# Patient Record
Sex: Male | Born: 1950
Health system: Southern US, Community
[De-identification: ages and names within clinical notes are randomized; demographics above are authoritative.]

## PROBLEM LIST (undated history)

## (undated) DIAGNOSIS — Z720 Tobacco use: Secondary | ICD-10-CM

## (undated) DIAGNOSIS — I10 Essential (primary) hypertension: Secondary | ICD-10-CM

## (undated) DIAGNOSIS — N189 Chronic kidney disease, unspecified: Secondary | ICD-10-CM

## (undated) DIAGNOSIS — IMO0002 Reserved for concepts with insufficient information to code with codable children: Secondary | ICD-10-CM

## (undated) DIAGNOSIS — D751 Secondary polycythemia: Secondary | ICD-10-CM

## (undated) DIAGNOSIS — E785 Hyperlipidemia, unspecified: Secondary | ICD-10-CM

## (undated) HISTORY — DX: Tobacco use: Z72.0

## (undated) HISTORY — PX: CYSTECTOMY: SUR359

## (undated) HISTORY — DX: Reserved for concepts with insufficient information to code with codable children: IMO0002

## (undated) HISTORY — DX: Chronic kidney disease, unspecified: N18.9

## (undated) HISTORY — DX: Hyperlipidemia, unspecified: E78.5

## (undated) HISTORY — DX: Secondary polycythemia: D75.1

---

## 2002-04-15 ENCOUNTER — Inpatient Hospital Stay (HOSPITAL_COMMUNITY): Admission: EM | Admit: 2002-04-15 | Discharge: 2002-04-26 | Payer: Self-pay

## 2002-04-16 ENCOUNTER — Encounter: Payer: Self-pay | Admitting: Internal Medicine

## 2002-04-17 ENCOUNTER — Encounter: Payer: Self-pay | Admitting: Internal Medicine

## 2002-04-20 ENCOUNTER — Encounter: Payer: Self-pay | Admitting: Internal Medicine

## 2002-05-01 ENCOUNTER — Encounter: Admission: RE | Admit: 2002-05-01 | Discharge: 2002-05-01 | Payer: Self-pay | Admitting: Internal Medicine

## 2002-05-08 ENCOUNTER — Encounter: Admission: RE | Admit: 2002-05-08 | Discharge: 2002-05-08 | Payer: Self-pay | Admitting: Internal Medicine

## 2002-05-15 ENCOUNTER — Encounter: Admission: RE | Admit: 2002-05-15 | Discharge: 2002-05-15 | Payer: Self-pay | Admitting: Internal Medicine

## 2002-05-18 ENCOUNTER — Encounter: Admission: RE | Admit: 2002-05-18 | Discharge: 2002-05-18 | Payer: Self-pay | Admitting: Internal Medicine

## 2002-05-21 ENCOUNTER — Encounter: Admission: RE | Admit: 2002-05-21 | Discharge: 2002-05-21 | Payer: Self-pay | Admitting: Internal Medicine

## 2002-05-22 ENCOUNTER — Encounter: Admission: RE | Admit: 2002-05-22 | Discharge: 2002-05-22 | Payer: Self-pay | Admitting: Internal Medicine

## 2002-05-28 ENCOUNTER — Encounter: Admission: RE | Admit: 2002-05-28 | Discharge: 2002-05-28 | Payer: Self-pay | Admitting: Internal Medicine

## 2002-06-04 ENCOUNTER — Encounter: Admission: RE | Admit: 2002-06-04 | Discharge: 2002-06-04 | Payer: Self-pay | Admitting: Internal Medicine

## 2002-06-11 ENCOUNTER — Encounter: Admission: RE | Admit: 2002-06-11 | Discharge: 2002-06-11 | Payer: Self-pay | Admitting: Internal Medicine

## 2002-06-18 ENCOUNTER — Encounter: Admission: RE | Admit: 2002-06-18 | Discharge: 2002-06-18 | Payer: Self-pay | Admitting: Internal Medicine

## 2002-06-25 ENCOUNTER — Encounter: Admission: RE | Admit: 2002-06-25 | Discharge: 2002-06-25 | Payer: Self-pay | Admitting: Internal Medicine

## 2002-07-28 ENCOUNTER — Encounter: Admission: RE | Admit: 2002-07-28 | Discharge: 2002-07-28 | Payer: Self-pay | Admitting: Internal Medicine

## 2002-08-04 ENCOUNTER — Encounter: Admission: RE | Admit: 2002-08-04 | Discharge: 2002-08-04 | Payer: Self-pay | Admitting: Internal Medicine

## 2002-10-05 ENCOUNTER — Encounter: Admission: RE | Admit: 2002-10-05 | Discharge: 2002-10-05 | Payer: Self-pay | Admitting: Internal Medicine

## 2002-12-08 ENCOUNTER — Encounter: Admission: RE | Admit: 2002-12-08 | Discharge: 2002-12-08 | Payer: Self-pay | Admitting: Surgery

## 2003-10-07 ENCOUNTER — Ambulatory Visit: Payer: Self-pay | Admitting: Internal Medicine

## 2003-10-18 ENCOUNTER — Ambulatory Visit: Payer: Self-pay | Admitting: Internal Medicine

## 2003-11-08 ENCOUNTER — Ambulatory Visit (HOSPITAL_COMMUNITY): Admission: RE | Admit: 2003-11-08 | Discharge: 2003-11-08 | Payer: Self-pay | Admitting: Internal Medicine

## 2005-12-25 ENCOUNTER — Inpatient Hospital Stay (HOSPITAL_COMMUNITY): Admission: EM | Admit: 2005-12-25 | Discharge: 2005-12-28 | Payer: Self-pay | Admitting: Emergency Medicine

## 2010-02-04 ENCOUNTER — Encounter: Payer: Self-pay | Admitting: Surgery

## 2010-12-15 ENCOUNTER — Inpatient Hospital Stay (HOSPITAL_COMMUNITY)
Admission: EM | Admit: 2010-12-15 | Discharge: 2010-12-28 | DRG: 585 | Disposition: A | Discharge status: Home / Self Care | Payer: BC Managed Care – PPO | Source: Ambulatory Visit | Attending: Surgery | Admitting: Surgery

## 2010-12-15 ENCOUNTER — Emergency Department (HOSPITAL_COMMUNITY): Payer: BC Managed Care – PPO

## 2010-12-15 ENCOUNTER — Encounter (HOSPITAL_COMMUNITY): Admission: EM | Disposition: A | Discharge status: Home / Self Care | Payer: Self-pay | Source: Ambulatory Visit

## 2010-12-15 ENCOUNTER — Other Ambulatory Visit: Payer: Self-pay

## 2010-12-15 ENCOUNTER — Encounter: Payer: Self-pay | Admitting: *Deleted

## 2010-12-15 DIAGNOSIS — R000 Tachycardia, unspecified: Secondary | ICD-10-CM | POA: Diagnosis present

## 2010-12-15 DIAGNOSIS — N289 Disorder of kidney and ureter, unspecified: Secondary | ICD-10-CM | POA: Diagnosis present

## 2010-12-15 DIAGNOSIS — Z8781 Personal history of (healed) traumatic fracture: Secondary | ICD-10-CM

## 2010-12-15 DIAGNOSIS — E119 Type 2 diabetes mellitus without complications: Secondary | ICD-10-CM | POA: Diagnosis present

## 2010-12-15 DIAGNOSIS — K659 Peritonitis, unspecified: Principal | ICD-10-CM | POA: Diagnosis present

## 2010-12-15 DIAGNOSIS — F17200 Nicotine dependence, unspecified, uncomplicated: Secondary | ICD-10-CM | POA: Diagnosis present

## 2010-12-15 DIAGNOSIS — I71019 Dissection of thoracic aorta, unspecified: Secondary | ICD-10-CM | POA: Diagnosis present

## 2010-12-15 DIAGNOSIS — K265 Chronic or unspecified duodenal ulcer with perforation: Secondary | ICD-10-CM | POA: Diagnosis present

## 2010-12-15 DIAGNOSIS — R6521 Severe sepsis with septic shock: Secondary | ICD-10-CM | POA: Diagnosis not present

## 2010-12-15 DIAGNOSIS — A419 Sepsis, unspecified organism: Secondary | ICD-10-CM | POA: Diagnosis not present

## 2010-12-15 DIAGNOSIS — I10 Essential (primary) hypertension: Secondary | ICD-10-CM | POA: Diagnosis present

## 2010-12-15 DIAGNOSIS — J449 Chronic obstructive pulmonary disease, unspecified: Secondary | ICD-10-CM | POA: Diagnosis present

## 2010-12-15 DIAGNOSIS — I739 Peripheral vascular disease, unspecified: Secondary | ICD-10-CM | POA: Diagnosis present

## 2010-12-15 DIAGNOSIS — R259 Unspecified abnormal involuntary movements: Secondary | ICD-10-CM | POA: Diagnosis present

## 2010-12-15 DIAGNOSIS — F1011 Alcohol abuse, in remission: Secondary | ICD-10-CM

## 2010-12-15 DIAGNOSIS — S42213A Unspecified displaced fracture of surgical neck of unspecified humerus, initial encounter for closed fracture: Secondary | ICD-10-CM | POA: Diagnosis present

## 2010-12-15 DIAGNOSIS — J95821 Acute postprocedural respiratory failure: Secondary | ICD-10-CM

## 2010-12-15 DIAGNOSIS — E669 Obesity, unspecified: Secondary | ICD-10-CM | POA: Diagnosis present

## 2010-12-15 DIAGNOSIS — I71012 Dissection of descending thoracic aorta: Secondary | ICD-10-CM

## 2010-12-15 DIAGNOSIS — I252 Old myocardial infarction: Secondary | ICD-10-CM

## 2010-12-15 DIAGNOSIS — J4489 Other specified chronic obstructive pulmonary disease: Secondary | ICD-10-CM | POA: Diagnosis present

## 2010-12-15 DIAGNOSIS — E876 Hypokalemia: Secondary | ICD-10-CM | POA: Diagnosis not present

## 2010-12-15 DIAGNOSIS — I7101 Dissection of thoracic aorta: Secondary | ICD-10-CM | POA: Diagnosis present

## 2010-12-15 HISTORY — DX: Essential (primary) hypertension: I10

## 2010-12-15 LAB — POCT I-STAT, CHEM 8
BUN: 60 mg/dL — ABNORMAL HIGH (ref 6–23)
Calcium, Ion: 1.11 mmol/L — ABNORMAL LOW (ref 1.12–1.32)
Chloride: 107 mEq/L (ref 96–112)
Creatinine, Ser: 1.9 mg/dL — ABNORMAL HIGH (ref 0.50–1.35)
Glucose, Bld: 132 mg/dL — ABNORMAL HIGH (ref 70–99)
HCT: 36 % — ABNORMAL LOW (ref 39.0–52.0)
Hemoglobin: 12.2 g/dL — ABNORMAL LOW (ref 13.0–17.0)
Potassium: 5.2 mEq/L — ABNORMAL HIGH (ref 3.5–5.1)
Sodium: 136 mEq/L (ref 135–145)
TCO2: 21 mmol/L (ref 0–100)

## 2010-12-15 LAB — DIFFERENTIAL
Basophils Absolute: 0 10*3/uL (ref 0.0–0.1)
Basophils Relative: 0 % (ref 0–1)
Eosinophils Absolute: 0 10*3/uL (ref 0.0–0.7)
Eosinophils Relative: 0 % (ref 0–5)
Lymphocytes Relative: 2 % — ABNORMAL LOW (ref 12–46)
Lymphs Abs: 0.6 10*3/uL — ABNORMAL LOW (ref 0.7–4.0)
Monocytes Absolute: 0.6 10*3/uL (ref 0.1–1.0)
Monocytes Relative: 3 % (ref 3–12)
Neutro Abs: 22.5 10*3/uL — ABNORMAL HIGH (ref 1.7–7.7)
Neutrophils Relative %: 95 % — ABNORMAL HIGH (ref 43–77)

## 2010-12-15 LAB — COMPREHENSIVE METABOLIC PANEL
ALT: 16 U/L (ref 0–53)
AST: 13 U/L (ref 0–37)
Albumin: 3.5 g/dL (ref 3.5–5.2)
Alkaline Phosphatase: 109 U/L (ref 39–117)
BUN: 57 mg/dL — ABNORMAL HIGH (ref 6–23)
CO2: 23 mEq/L (ref 19–32)
Calcium: 9.5 mg/dL (ref 8.4–10.5)
Chloride: 99 mEq/L (ref 96–112)
Creatinine, Ser: 1.81 mg/dL — ABNORMAL HIGH (ref 0.50–1.35)
GFR calc Af Amer: 45 mL/min — ABNORMAL LOW (ref >90)
GFR calc non Af Amer: 39 mL/min — ABNORMAL LOW (ref >90)
Glucose, Bld: 133 mg/dL — ABNORMAL HIGH (ref 70–99)
Potassium: 5.1 mEq/L (ref 3.5–5.1)
Sodium: 134 mEq/L — ABNORMAL LOW (ref 135–145)
Total Bilirubin: 0.5 mg/dL (ref 0.3–1.2)
Total Protein: 6.9 g/dL (ref 6.0–8.3)

## 2010-12-15 LAB — CBC
HCT: 34.1 % — ABNORMAL LOW (ref 39.0–52.0)
Hemoglobin: 11.3 g/dL — ABNORMAL LOW (ref 13.0–17.0)
MCH: 31 pg (ref 26.0–34.0)
MCHC: 33.1 g/dL (ref 30.0–36.0)
MCV: 93.4 fL (ref 78.0–100.0)
Platelets: 286 10*3/uL (ref 150–400)
RBC: 3.65 MIL/uL — ABNORMAL LOW (ref 4.22–5.81)
RDW: 13.3 % (ref 11.5–15.5)
WBC: 23.7 10*3/uL — ABNORMAL HIGH (ref 4.0–10.5)

## 2010-12-15 LAB — POCT I-STAT TROPONIN I: Troponin i, poc: 0.01 ng/mL (ref 0.00–0.08)

## 2010-12-15 LAB — LIPASE, BLOOD: Lipase: 61 U/L — ABNORMAL HIGH (ref 11–59)

## 2010-12-15 SURGERY — LAPAROTOMY, EXPLORATORY
Anesthesia: General

## 2010-12-15 MED ORDER — HYDROMORPHONE HCL PF 1 MG/ML IJ SOLN
1.0000 mg | Freq: Once | INTRAMUSCULAR | Status: AC
  Administered 2010-12-15: 1 mg via INTRAVENOUS

## 2010-12-15 MED ORDER — HYDROMORPHONE HCL PF 1 MG/ML IJ SOLN
1.0000 mg | Freq: Once | INTRAMUSCULAR | Status: AC
  Administered 2010-12-15: 1 mg via INTRAVENOUS
  Filled 2010-12-15: qty 1

## 2010-12-15 MED ORDER — SODIUM CHLORIDE 0.9 % IV SOLN
1.5000 g | Freq: Once | INTRAVENOUS | Status: AC
  Administered 2010-12-15: 1.5 g via INTRAVENOUS
  Filled 2010-12-15: qty 1.5

## 2010-12-15 MED ORDER — SODIUM CHLORIDE 0.9 % IV SOLN
INTRAVENOUS | Status: AC
  Filled 2010-12-15: qty 1.5

## 2010-12-15 MED ORDER — IOHEXOL 300 MG/ML  SOLN
80.0000 mL | Freq: Once | INTRAMUSCULAR | Status: AC | PRN
  Administered 2010-12-15: 80 mL via INTRAVENOUS

## 2010-12-15 MED ORDER — SODIUM CHLORIDE 0.9 % IV SOLN
Freq: Once | INTRAVENOUS | Status: AC
  Administered 2010-12-15 (×2): via INTRAVENOUS

## 2010-12-15 MED ORDER — ONDANSETRON HCL 4 MG/2ML IJ SOLN
4.0000 mg | Freq: Once | INTRAMUSCULAR | Status: AC
  Administered 2010-12-15: 4 mg via INTRAVENOUS
  Filled 2010-12-15: qty 2

## 2010-12-15 MED ORDER — HYDROMORPHONE HCL PF 1 MG/ML IJ SOLN
INTRAMUSCULAR | Status: AC
  Administered 2010-12-15: 1 mg via INTRAVENOUS
  Filled 2010-12-15: qty 1

## 2010-12-15 SURGICAL SUPPLY — 58 items
APPLIER CLIP 11 MED OPEN (CLIP)
APPLIER CLIP 13 LRG OPEN (CLIP)
APR CLP LRG 13 20 CLIP (CLIP)
APR CLP MED 11 20 MLT OPN (CLIP)
BAG HAMPER (MISCELLANEOUS) ×1 IMPLANT
BARRIER SKIN 2 3/4 (OSTOMY) IMPLANT
BARRIER SKIN OD2.25 2 3/4 FLNG (OSTOMY) IMPLANT
BRR SKN FLT 2.75X2.25 2 PC (OSTOMY)
CLAMP POUCH DRAINAGE QUIET (OSTOMY) IMPLANT
CLIP APPLIE 11 MED OPEN (CLIP) IMPLANT
CLIP APPLIE 13 LRG OPEN (CLIP) IMPLANT
CLOTH BEACON ORANGE TIMEOUT ST (SAFETY) ×1 IMPLANT
COVER LIGHT HANDLE STERIS (MISCELLANEOUS) ×2 IMPLANT
DRAPE WARM FLUID 44X44 (DRAPE) ×1 IMPLANT
DURAPREP 26ML APPLICATOR (WOUND CARE) ×1 IMPLANT
ELECT BLADE 6 FLAT ULTRCLN (ELECTRODE) IMPLANT
ELECT REM PT RETURN 9FT ADLT (ELECTROSURGICAL)
ELECTRODE REM PT RTRN 9FT ADLT (ELECTROSURGICAL) ×1 IMPLANT
GLOVE BIO SURGEON STRL SZ7.5 (GLOVE) ×2 IMPLANT
GOWN STRL REIN XL XLG (GOWN DISPOSABLE) ×3 IMPLANT
HARMONIC SHEARS 14CM COAG (MISCELLANEOUS) IMPLANT
INST SET MAJOR GENERAL (KITS) ×1 IMPLANT
KIT REMOVER STAPLE SKIN (MISCELLANEOUS) IMPLANT
KIT ROOM TURNOVER APOR (KITS) ×1 IMPLANT
LIGASURE IMPACT 36 18CM CVD LR (INSTRUMENTS) IMPLANT
MANIFOLD NEPTUNE II (INSTRUMENTS) ×1 IMPLANT
NS IRRIG 1000ML POUR BTL (IV SOLUTION) ×1 IMPLANT
PACK ABDOMINAL MAJOR (CUSTOM PROCEDURE TRAY) ×1 IMPLANT
PAD ARMBOARD 7.5X6 YLW CONV (MISCELLANEOUS) ×1 IMPLANT
POUCH OSTOMY 2 3/4  H 3804 (WOUND CARE)
POUCH OSTOMY 2 3/4 H 3804 (WOUND CARE)
POUCH OSTOMY 2 PC DRNBL 2.75 (WOUND CARE) IMPLANT
RELOAD LINEAR CUT PROX 55 BLUE (ENDOMECHANICALS) IMPLANT
RELOAD PROXIMATE 75MM BLUE (ENDOMECHANICALS) IMPLANT
RELOAD STAPLE 55 3.8 BLU REG (ENDOMECHANICALS) IMPLANT
RELOAD STAPLE 75 3.8 BLU REG (ENDOMECHANICALS) IMPLANT
RETRACTOR WND ALEXIS 25 LRG (MISCELLANEOUS) IMPLANT
RETRACTOR WOUND ALXS 34CM XLRG (MISCELLANEOUS) IMPLANT
RTRCTR WOUND ALEXIS 25CM LRG (MISCELLANEOUS)
RTRCTR WOUND ALEXIS 34CM XLRG (MISCELLANEOUS)
SET BASIN LINEN APH (SET/KITS/TRAYS/PACK) ×1 IMPLANT
SHEARS HARMONIC 23CM COAG (MISCELLANEOUS) IMPLANT
SPONGE GAUZE 4X4 12PLY (GAUZE/BANDAGES/DRESSINGS) ×1 IMPLANT
SPONGE LAP 18X18 X RAY DECT (DISPOSABLE) ×1 IMPLANT
STAPLER GUN LINEAR PROX 60 (STAPLE) IMPLANT
STAPLER PROXIMATE 55 BLUE (STAPLE) IMPLANT
STAPLER PROXIMATE 75MM BLUE (STAPLE) IMPLANT
STAPLER VISISTAT (STAPLE) ×1 IMPLANT
SUCTION POOLE TIP (SUCTIONS) ×1 IMPLANT
SUT CHROMIC 0 SH (SUTURE) ×1 IMPLANT
SUT CHROMIC 2 0 SH (SUTURE) ×1 IMPLANT
SUT NOVA NAB GS-26 0 60 (SUTURE) ×2 IMPLANT
SUT SILK 2 0 (SUTURE)
SUT SILK 2 0 REEL (SUTURE) ×1 IMPLANT
SUT SILK 2-0 18XBRD TIE 12 (SUTURE) ×1 IMPLANT
SUT SILK 3 0 SH CR/8 (SUTURE) ×2 IMPLANT
TOWEL BLUE STERILE X RAY DET (MISCELLANEOUS) ×1 IMPLANT
TRAY FOLEY CATH 14FR (SET/KITS/TRAYS/PACK) ×1 IMPLANT

## 2010-12-15 NOTE — ED Notes (Signed)
Pt c/o left sided abd pain that started this am; pt denies any n/v/d

## 2010-12-15 NOTE — ED Notes (Signed)
Complaining of left side abd pain. States pain began this morning. Rates pain as 7/10. Constant with spasms. Last ate at 2000 Thursday.

## 2010-12-15 NOTE — ED Notes (Signed)
Pt to be transferred to [facility] ed, pt requesting additional pain medication, Dr. Roderic Palau notified, additional orders given

## 2010-12-15 NOTE — ED Notes (Signed)
[facility] here to transport pt to [facility]

## 2010-12-15 NOTE — ED Notes (Signed)
Pt brother Ronalee Belts notified of pt being moved to [place]

## 2010-12-15 NOTE — ED Notes (Signed)
Dr. Arnoldo Morale in to speak with pt,

## 2010-12-15 NOTE — ED Notes (Signed)
Pt states that the pain medication has not changed his pain, pt screaming out in pain, abd tender to touch, Dr. Roderic Palau notified,

## 2010-12-15 NOTE — ED Notes (Signed)
Telephone orders received from Dr. Arnoldo Morale,

## 2010-12-15 NOTE — ED Notes (Signed)
Ronalee Belts (pt's brother) had to leave contact number is [phone]

## 2010-12-15 NOTE — ED Provider Notes (Signed)
History     CSN: [id number] Arrival date & time: 12/15/2010  5:33 PM   First MD Initiated Contact with Patient 12/15/10 1736      Chief Complaint  Patient presents with  . Abdominal Pain    (Consider location/radiation/quality/duration/timing/severity/associated sxs/prior treatment) Patient is a 60 y.o. male presenting with abdominal pain. The history is provided by the patient (The patient complains of severe left upper quadrant abdominal pain. It began this morning when he woke he had no vomiting). No language interpreter was used.  Abdominal Pain The primary symptoms of the illness include abdominal pain and nausea. The primary symptoms of the illness do not include fatigue, diarrhea, hematemesis or dysuria. The current episode started 6 to 12 hours ago. The onset of the illness was gradual. The problem has been rapidly worsening.  The abdominal pain does not radiate. The severity of the abdominal pain is 6/10. The abdominal pain is relieved by nothing. The abdominal pain is exacerbated by deep breathing.  The patient states that she believes she is currently not pregnant. The patient has not had a change in bowel habit. Symptoms associated with the illness do not include heartburn, constipation, hematuria, frequency or back pain. Significant associated medical issues do not include sickle cell disease or cardiac disease.    Past Medical History  Diagnosis Date  . Diabetes mellitus   . Hypertension     Past Surgical History  Procedure Date  . Cystectomy under tongue    History reviewed. No pertinent family history.  History  Substance Use Topics  . Smoking status: Current Everyday Smoker -- 1.0 packs/day    Types: Cigarettes  . Smokeless tobacco: Not on file  . Alcohol Use: Yes     daily      Review of Systems  Constitutional: Negative for fatigue.  HENT: Negative for congestion, sinus pressure and ear discharge.   Eyes: Negative for discharge.  Respiratory:  Negative for cough.   Cardiovascular: Negative for chest pain.  Gastrointestinal: Positive for nausea and abdominal pain. Negative for heartburn, diarrhea, constipation and hematemesis.  Genitourinary: Negative for dysuria, frequency and hematuria.  Musculoskeletal: Negative for back pain.  Skin: Negative for rash.  Neurological: Negative for seizures and headaches.  Hematological: Negative.   Psychiatric/Behavioral: Negative for hallucinations.    Allergies  Review of patient's allergies indicates no known allergies.  Home Medications  No current outpatient prescriptions on file.  BP 129/83  Pulse 96  Temp(Src) 97.3 F (36.3 C) (Oral)  Resp 16  Ht 5\' 6"  (1.676 m)  Wt 210 lb (95.255 kg)  BMI 33.89 kg/m2  SpO2 97%  Physical Exam  Constitutional: He is oriented to person, place, and time. He appears well-developed.  HENT:  Head: Normocephalic and atraumatic.  Eyes: Conjunctivae and EOM are normal. No scleral icterus.  Neck: Neck supple. No thyromegaly present.  Cardiovascular: Normal rate and regular rhythm.  Exam reveals no gallop and no friction rub.   No murmur heard. Pulmonary/Chest: No stridor. He has no wheezes. He has no rales. He exhibits no tenderness.  Abdominal: He exhibits no distension. There is tenderness. There is no rebound.       Tender luq  Musculoskeletal: Normal range of motion. He exhibits no edema.  Lymphadenopathy:    He has no cervical adenopathy.  Neurological: He is oriented to person, place, and time. Coordination normal.  Skin: No rash noted. No erythema.  Psychiatric: He has a normal mood and affect. His behavior is normal.  ED Course  Procedures (including critical care time)  Labs Reviewed  CBC - Abnormal; Notable for the following:    WBC 23.7 (*)    RBC 3.65 (*)    Hemoglobin 11.3 (*)    HCT 34.1 (*)    All other components within normal limits  DIFFERENTIAL - Abnormal; Notable for the following:    Neutrophils Relative 95 (*)     Neutro Abs 22.5 (*)    Lymphocytes Relative 2 (*)    Lymphs Abs 0.6 (*)    All other components within normal limits  COMPREHENSIVE METABOLIC PANEL - Abnormal; Notable for the following:    Sodium 134 (*)    Glucose, Bld 133 (*)    BUN 57 (*)    Creatinine, Ser 1.81 (*)    GFR calc non Af Amer 39 (*)    GFR calc Af Amer 45 (*)    All other components within normal limits  LIPASE, BLOOD - Abnormal; Notable for the following:    Lipase 61 (*)    All other components within normal limits  POCT I-STAT, CHEM 8 - Abnormal; Notable for the following:    Potassium 5.2 (*)    BUN 60 (*)    Creatinine, Ser 1.90 (*)    Glucose, Bld 132 (*)    Calcium, Ion 1.11 (*)    Hemoglobin 12.2 (*)    HCT 36.0 (*)    All other components within normal limits  POCT I-STAT TROPONIN I  I-STAT TROPONIN I  I-STAT, CHEM 8   Ct Abdomen Pelvis W Contrast  12/15/2010  *RADIOLOGY REPORT*  Clinical Data: Abdominal pain.  CT ABDOMEN AND PELVIS WITH CONTRAST  Technique:  Multidetector CT imaging of the abdomen and pelvis was performed following the standard protocol during bolus administration of intravenous contrast.  Contrast: 47mL OMNIPAQUE IOHEXOL 300 MG/ML IV SOLN  Comparison: 12/08/2002  Findings: Chronic aortic dissection is again noted.  Aorta now aneurysmal infrarenally measuring up to 3.9 cm compared 2.5 cm previously.  Dissection continues into the left common iliac artery.  Previously, the smaller lumen was felt to represent the false lumen.  If this is the case, the true lumen is occluded in the left common iliac artery with the false lumen remaining open and supplying the left internal iliac artery and external iliac artery.  Left common iliac artery is aneurysmal at 2.3 cm.  Mild aneurysmal dilatation of the right common iliac artery measuring 2.4 cm.  There is free air within the abdomen, anterior to the liver and continuing inferiorly.  Exact cause of free air is not visualized, but may be gastric in  origin as the majority of the free fluid is near the stomach.  There is a suggestion of possible wall thickening along the lesser curvature and in the distal stomach. Remainder of the large and small bowel are unremarkable.  Appendix is visualized and is normal.  No free fluid or adenopathy.  Liver, spleen, pancreas, adrenals and kidneys are unremarkable.  Lung bases are clear.  Heart is borderline in size.  IMPRESSION: Moderate pneumoperitoneum.  Question gastric origin.  Exact cause is not visualized but findings are compatible with bowel rupture.  Chronic aortic dissection.  New distal aortic and common iliac artery aneurysms.  The distal aorta measures maximally 3.9 cm.  The true lumen is occluded in the left common iliac artery.  So that the  These results were discussed with Dr. Roderic Palau at the time of interpretation.  Original Report Authenticated  By: Raelyn Number, M.D.   Dg Chest Portable 1 View  12/15/2010  *RADIOLOGY REPORT*  Clinical Data: Chest pain.  PORTABLE CHEST - 1 VIEW  Comparison: Chest CT 12/08/2002.  Findings: The heart is mildly enlarged.  The mediastinal and hilar contours are within normal limits.  The lungs are clear.  No pleural effusion.  The bony thorax is intact. A right humeral neck fracture is noted.  IMPRESSION: No acute cardiopulmonary findings. Right humeral neck fracture.  Original Report Authenticated By: P. Kalman Jewels, M.D.     1. Abdominal pain      Date: 12/15/2010  Rate: 83  Rhythm: normal sinus rhythm  QRS Axis: normal  Intervals: normal  ST/T Wave abnormalities: normal  Conduction Disutrbances:none  Narrative Interpretation:   Old EKG Reviewed: none available    MDM  abd pain ruptured viscous   Dr Arnoldo Morale to see pt     Maudry Diego, MD 12/15/10 2041

## 2010-12-15 NOTE — ED Notes (Signed)
Left side abd pain that started this am, abd distented, pt uncomfortable on stretcher, pt right arm in sling due to right humerus fx

## 2010-12-15 NOTE — ED Notes (Signed)
Pt states that his pain is better, pt calmer, able to lay flat on stretcher,

## 2010-12-15 NOTE — ED Notes (Addendum)
Dr. Arnoldo Morale here to evaluate pt for surgery, pt and family updated,

## 2010-12-15 NOTE — ED Notes (Signed)
Pt requesting additional pian medication, Dr. Roderic Palau notified, additional orders given

## 2010-12-15 NOTE — ED Notes (Signed)
MD at bedside. 

## 2010-12-15 NOTE — H&P (Addendum)
Harry James is an 60 y.o. male.   Chief Complaint: Abdominal pain HPI: Patient is a 60 year old white male who has been developing abdominal pain over the past 24 hours. The pain became more severe he presented emergency room for further evaluation. CT scan the abdomen and pelvis reveals pneumoperitoneum with free fluid and possible thickened gastric wall distally. He has never had this pain before. He states he broke his right humerus 2 weeks ago and has been taking Aleve, hydrocodone, and other medication. Denies BC powder or aspirin use.  Past Medical History  Diagnosis Date  . Diabetes mellitus   . Hypertension     Past Surgical History  Procedure Date  . Cystectomy under tongue    History reviewed. No pertinent family history. Social History:  reports that he has been smoking Cigarettes.  He has been smoking about 1 pack per day. He does not have any smokeless tobacco history on file. He reports that he drinks alcohol. He reports that he does not use illicit drugs.  Allergies: No Known Allergies  Medications Prior to Admission  Medication Dose Route Frequency Provider Last Rate Last Dose  . 0.9 %  sodium chloride infusion   Intravenous Once Maudry Diego, MD 125 mL/hr at 12/15/10 1840    . ampicillin-sulbactam (UNASYN) 1.5 g in sodium chloride 0.9 % 50 mL IVPB  1.5 g Intravenous Once [employer]      . HYDROmorphone (DILAUDID) injection 1 mg  1 mg Intravenous Once Maudry Diego, MD   1 mg at 12/15/10 1836  . HYDROmorphone (DILAUDID) injection 1 mg  1 mg Intravenous Once Maudry Diego, MD   1 mg at 12/15/10 1859  . HYDROmorphone (DILAUDID) injection 1 mg  1 mg Intravenous Once Maudry Diego, MD   1 mg at 12/15/10 2028  . iohexol (OMNIPAQUE) 300 MG/ML injection 80 mL  80 mL Intravenous Once PRN Medication Radiologist   80 mL at 12/15/10 1935  . ondansetron (ZOFRAN) injection 4 mg  4 mg Intravenous Once Maudry Diego, MD   4 mg at 12/15/10 1836   No current outpatient  prescriptions on file as of 12/15/2010.    Results for orders placed during the hospital encounter of 12/15/10 (from the past 48 hour(s))  CBC     Status: Abnormal   Collection Time   12/15/10  5:53 PM      Component Value Range Comment   WBC 23.7 (*) 4.0 - 10.5 (K/uL)    RBC 3.65 (*) 4.22 - 5.81 (MIL/uL)    Hemoglobin 11.3 (*) 13.0 - 17.0 (g/dL)    HCT 34.1 (*) 39.0 - 52.0 (%)    MCV 93.4  78.0 - 100.0 (fL)    MCH 31.0  26.0 - 34.0 (pg)    MCHC 33.1  30.0 - 36.0 (g/dL)    RDW 13.3  11.5 - 15.5 (%)    Platelets 286  150 - 400 (K/uL)   DIFFERENTIAL     Status: Abnormal   Collection Time   12/15/10  5:53 PM      Component Value Range Comment   Neutrophils Relative 95 (*) 43 - 77 (%)    Neutro Abs 22.5 (*) 1.7 - 7.7 (K/uL)    Lymphocytes Relative 2 (*) 12 - 46 (%)    Lymphs Abs 0.6 (*) 0.7 - 4.0 (K/uL)    Monocytes Relative 3  3 - 12 (%)    Monocytes Absolute 0.6  0.1 - 1.0 (  K/uL)    Eosinophils Relative 0  0 - 5 (%)    Eosinophils Absolute 0.0  0.0 - 0.7 (K/uL)    Basophils Relative 0  0 - 1 (%)    Basophils Absolute 0.0  0.0 - 0.1 (K/uL)   COMPREHENSIVE METABOLIC PANEL     Status: Abnormal   Collection Time   12/15/10  5:53 PM      Component Value Range Comment   Sodium 134 (*) 135 - 145 (mEq/L)    Potassium 5.1  3.5 - 5.1 (mEq/L)    Chloride 99  96 - 112 (mEq/L)    CO2 23  19 - 32 (mEq/L)    Glucose, Bld 133 (*) 70 - 99 (mg/dL)    BUN 57 (*) 6 - 23 (mg/dL)    Creatinine, Ser 1.81 (*) 0.50 - 1.35 (mg/dL)    Calcium 9.5  8.4 - 10.5 (mg/dL)    Total Protein 6.9  6.0 - 8.3 (g/dL)    Albumin 3.5  3.5 - 5.2 (g/dL)    AST 13  0 - 37 (U/L)    ALT 16  0 - 53 (U/L)    Alkaline Phosphatase 109  39 - 117 (U/L)    Total Bilirubin 0.5  0.3 - 1.2 (mg/dL)    GFR calc non Af Amer 39 (*) >90 (mL/min)    GFR calc Af Amer 45 (*) >90 (mL/min)   LIPASE, BLOOD     Status: Abnormal   Collection Time   12/15/10  5:53 PM      Component Value Range Comment   Lipase 61 (*) 11 - 59 (U/L)     POCT I-STAT, CHEM 8     Status: Abnormal   Collection Time   12/15/10  6:48 PM      Component Value Range Comment   Sodium 136  135 - 145 (mEq/L)    Potassium 5.2 (*) 3.5 - 5.1 (mEq/L)    Chloride 107  96 - 112 (mEq/L)    BUN 60 (*) 6 - 23 (mg/dL)    Creatinine, Ser 1.90 (*) 0.50 - 1.35 (mg/dL)    Glucose, Bld 132 (*) 70 - 99 (mg/dL)    Calcium, Ion 1.11 (*) 1.12 - 1.32 (mmol/L)    TCO2 21  0 - 100 (mmol/L)    Hemoglobin 12.2 (*) 13.0 - 17.0 (g/dL)    HCT 36.0 (*) 39.0 - 52.0 (%)   POCT I-STAT TROPONIN I     Status: Normal   Collection Time   12/15/10  7:05 PM      Component Value Range Comment   Troponin i, poc 0.01  0.00 - 0.08 (ng/mL)    Comment 3             Ct Abdomen Pelvis W Contrast  12/15/2010  *RADIOLOGY REPORT*  Clinical Data: Abdominal pain.  CT ABDOMEN AND PELVIS WITH CONTRAST  Technique:  Multidetector CT imaging of the abdomen and pelvis was performed following the standard protocol during bolus administration of intravenous contrast.  Contrast: 77mL OMNIPAQUE IOHEXOL 300 MG/ML IV SOLN  Comparison: 12/08/2002  Findings: Chronic aortic dissection is again noted.  Aorta now aneurysmal infrarenally measuring up to 3.9 cm compared 2.5 cm previously.  Dissection continues into the left common iliac artery.  Previously, the smaller lumen was felt to represent the false lumen.  If this is the case, the true lumen is occluded in the left common iliac artery with the false lumen remaining open and supplying the  left internal iliac artery and external iliac artery.  Left common iliac artery is aneurysmal at 2.3 cm.  Mild aneurysmal dilatation of the right common iliac artery measuring 2.4 cm.  There is free air within the abdomen, anterior to the liver and continuing inferiorly.  Exact cause of free air is not visualized, but may be gastric in origin as the majority of the free fluid is near the stomach.  There is a suggestion of possible wall thickening along the lesser curvature and  in the distal stomach. Remainder of the large and small bowel are unremarkable.  Appendix is visualized and is normal.  No free fluid or adenopathy.  Liver, spleen, pancreas, adrenals and kidneys are unremarkable.  Lung bases are clear.  Heart is borderline in size.  IMPRESSION: Moderate pneumoperitoneum.  Question gastric origin.  Exact cause is not visualized but findings are compatible with bowel rupture.  Chronic aortic dissection.  New distal aortic and common iliac artery aneurysms.  The distal aorta measures maximally 3.9 cm.  The true lumen is occluded in the left common iliac artery.  So that the  These results were discussed with Dr. Roderic Palau at the time of interpretation.  Original Report Authenticated By: Raelyn Number, M.D.   Dg Chest Portable 1 View  12/15/2010  *RADIOLOGY REPORT*  Clinical Data: Chest pain.  PORTABLE CHEST - 1 VIEW  Comparison: Chest CT 12/08/2002.  Findings: The heart is mildly enlarged.  The mediastinal and hilar contours are within normal limits.  The lungs are clear.  No pleural effusion.  The bony thorax is intact. A right humeral neck fracture is noted.  IMPRESSION: No acute cardiopulmonary findings. Right humeral neck fracture.  Original Report Authenticated By: P. Kalman Jewels, M.D.    Review of Systems  Constitutional: Positive for malaise/fatigue. Negative for chills.  HENT: Negative.   Eyes: Negative.   Respiratory: Positive for cough.   Cardiovascular: Negative.   Gastrointestinal: Positive for nausea, vomiting and abdominal pain.  Genitourinary: Negative.   Musculoskeletal: Negative.   Skin: Negative.   Neurological: Positive for weakness.  Endo/Heme/Allergies: Negative.     Blood pressure 129/83, pulse 96, temperature 97.3 F (36.3 C), temperature source Oral, resp. rate 16, height 5\' 6"  (1.676 m), weight 95.255 kg (210 lb), SpO2 97.00%. Physical Exam  Constitutional: He is oriented to person, place, and time. He appears well-developed and  well-nourished. He appears distressed.  HENT:  Head: Normocephalic and atraumatic.  Neck: Normal range of motion. Neck supple.  Cardiovascular: Normal rate, regular rhythm and normal heart sounds.   Respiratory: Effort normal. He has rales.  GI: He exhibits distension. There is tenderness. There is rebound and guarding.  Neurological: He is alert and oriented to person, place, and time.  Skin: Skin is warm and dry.  Psychiatric: He has a normal mood and affect. His behavior is normal.     Assessment/Plan Impression: Pneumoperitoneum, probable perforated peptic ulcer Plan: The patient will be taken emergently to the operating room for exploratory laparotomy. The risks and benefits of the procedure including bleeding, infection, and the possibility of cardiopulmonary difficulties were fully explained to the patient, gave informed consent.  Meryem Haertel A 12/15/2010, 8:48 PM  CRNA here at [facility] evaluated him and does not feel she can intubate him here safely.  No anesthesiologist on call to assist.  Will discuss with [facility] for possible transfer.  Patient made aware.

## 2010-12-15 NOTE — ED Notes (Signed)
Report given to Santiago Glad, RN at [facility]

## 2010-12-15 NOTE — Anesthesia Preprocedure Evaluation (Addendum)
Anesthesia Evaluation  Patient identified by MRN, date of birth, ID band Patient awake    Reviewed: Allergy & Precautions, H&P , NPO status , Patient's Chart, lab work & pertinent test results  Airway Mallampati: IV TM Distance: >3 FB Neck ROM: Full   Comment: Small mouth Dental  (+) Teeth Intact, Poor Dentition and Chipped   Pulmonary shortness of breath and at rest, COPDCurrent Smoker (1-2 packs per day),  + rhonchi  + wheezing      Cardiovascular Exercise Tolerance: Poor hypertension (unable to obtain med list [facility] denies prescription refill since Dec. 2011), Pt. on medications + Past MI, + Peripheral Vascular Disease and + DOE Regular Tachycardia Chronic aortic dissection on Ct. Pt. And pts. Brother states he has only one functional kidney due to uncontrolled HTN   Neuro/Psych    GI/Hepatic Patient received Oral Contrast Agents (sip-unable to complete according to pt and  nurse),  Endo/Other  Diabetes mellitus-, Poorly Controlled, Oral Hypoglycemic AgentsMorbid obesity  Renal/GU Renal InsufficiencyRenal diseaseHTN one functional kidney according to pt.     Musculoskeletal   Abdominal (+) obese,  Abdomen: tender.    Peds  Hematology   Anesthesia Other Findings   Reproductive/Obstetrics                      Anesthesia Physical Anesthesia Plan  ASA: III and Emergent  Anesthesia Plan: General   Post-op Pain Management:    Induction: Rapid sequence, Cricoid pressure planned and Intravenous  Airway Management Planned: Oral ETT and Video Laryngoscope Planned  Additional Equipment:   Intra-op Plan:   Post-operative Plan: Post-operative intubation/ventilation  Informed Consent: I have reviewed the patients History and Physical, chart, labs and discussed the procedure including the risks, benefits and alternatives for the proposed anesthesia with the patient or authorized  representative who has indicated his/her understanding and acceptance.   Dental advisory given  Plan Discussed with: CRNA and Surgeon  Anesthesia Plan Comments: (Case discussed with Dr. Arnoldo Morale. Difficult airway and abnormal labs  Discussed. Case cancelled per order Dr. Peggye Form.yates CRNA)     Anesthesia Quick Evaluation

## 2010-12-16 ENCOUNTER — Inpatient Hospital Stay (HOSPITAL_COMMUNITY): Payer: BC Managed Care – PPO

## 2010-12-16 ENCOUNTER — Encounter (HOSPITAL_COMMUNITY): Payer: Self-pay | Admitting: Anesthesiology

## 2010-12-16 ENCOUNTER — Encounter (HOSPITAL_COMMUNITY): Admission: EM | Disposition: A | Discharge status: Home / Self Care | Payer: Self-pay | Source: Ambulatory Visit

## 2010-12-16 ENCOUNTER — Inpatient Hospital Stay (HOSPITAL_COMMUNITY): Payer: BC Managed Care – PPO | Admitting: Anesthesiology

## 2010-12-16 DIAGNOSIS — R6521 Severe sepsis with septic shock: Secondary | ICD-10-CM

## 2010-12-16 DIAGNOSIS — S42213A Unspecified displaced fracture of surgical neck of unspecified humerus, initial encounter for closed fracture: Secondary | ICD-10-CM | POA: Diagnosis present

## 2010-12-16 DIAGNOSIS — I10 Essential (primary) hypertension: Secondary | ICD-10-CM | POA: Diagnosis present

## 2010-12-16 DIAGNOSIS — N289 Disorder of kidney and ureter, unspecified: Secondary | ICD-10-CM | POA: Diagnosis present

## 2010-12-16 DIAGNOSIS — K261 Acute duodenal ulcer with perforation: Secondary | ICD-10-CM

## 2010-12-16 DIAGNOSIS — A419 Sepsis, unspecified organism: Secondary | ICD-10-CM

## 2010-12-16 DIAGNOSIS — E669 Obesity, unspecified: Secondary | ICD-10-CM | POA: Diagnosis present

## 2010-12-16 DIAGNOSIS — K265 Chronic or unspecified duodenal ulcer with perforation: Secondary | ICD-10-CM | POA: Diagnosis present

## 2010-12-16 DIAGNOSIS — R0902 Hypoxemia: Secondary | ICD-10-CM

## 2010-12-16 DIAGNOSIS — J9600 Acute respiratory failure, unspecified whether with hypoxia or hypercapnia: Secondary | ICD-10-CM

## 2010-12-16 DIAGNOSIS — K659 Peritonitis, unspecified: Secondary | ICD-10-CM

## 2010-12-16 HISTORY — PX: LAPAROTOMY: SHX154

## 2010-12-16 LAB — CORTISOL: Cortisol, Plasma: 13 ug/dL

## 2010-12-16 LAB — MAGNESIUM: Magnesium: 1.6 mg/dL (ref 1.5–2.5)

## 2010-12-16 LAB — POCT I-STAT 3, ART BLOOD GAS (G3+)
Bicarbonate: 18.4 mEq/L — ABNORMAL LOW (ref 20.0–24.0)
Bicarbonate: 19.4 mEq/L — ABNORMAL LOW (ref 20.0–24.0)
O2 Saturation: 91 %
Patient temperature: 99.4
Patient temperature: 99.4
TCO2: 20 mmol/L (ref 0–100)
TCO2: 21 mmol/L (ref 0–100)
pCO2 arterial: 48 mmHg — ABNORMAL HIGH (ref 35.0–45.0)
pH, Arterial: 7.196 — CL (ref 7.350–7.450)
pO2, Arterial: 193 mmHg — ABNORMAL HIGH (ref 80.0–100.0)

## 2010-12-16 LAB — CBC
HCT: 27.8 % — ABNORMAL LOW (ref 39.0–52.0)
Hemoglobin: 9.5 g/dL — ABNORMAL LOW (ref 13.0–17.0)
Hemoglobin: 9.1 g/dL — ABNORMAL LOW (ref 13.0–17.0)
MCH: 30.4 pg (ref 26.0–34.0)
MCH: 30.3 pg (ref 26.0–34.0)
MCHC: 32.5 g/dL (ref 30.0–36.0)
MCHC: 32.7 g/dL (ref 30.0–36.0)
MCV: 92.7 fL (ref 78.0–100.0)
Platelets: 243 K/uL (ref 150–400)
RBC: 3 MIL/uL — ABNORMAL LOW (ref 4.22–5.81)
RDW: 13.6 % (ref 11.5–15.5)
WBC: 28.1 K/uL — ABNORMAL HIGH (ref 4.0–10.5)

## 2010-12-16 LAB — PROCALCITONIN: Procalcitonin: 5.79 ng/mL

## 2010-12-16 LAB — COMPREHENSIVE METABOLIC PANEL
ALT: 13 U/L (ref 0–53)
AST: 14 U/L (ref 0–37)
CO2: 19 mEq/L (ref 19–32)
Chloride: 107 mEq/L (ref 96–112)
GFR calc non Af Amer: 39 mL/min — ABNORMAL LOW (ref >90)
Sodium: 137 mEq/L (ref 135–145)
Total Bilirubin: 0.4 mg/dL (ref 0.3–1.2)

## 2010-12-16 LAB — BASIC METABOLIC PANEL WITH GFR
BUN: 51 mg/dL — ABNORMAL HIGH (ref 6–23)
CO2: 20 meq/L (ref 19–32)
Calcium: 7.7 mg/dL — ABNORMAL LOW (ref 8.4–10.5)
Chloride: 110 meq/L (ref 96–112)
Creatinine, Ser: 1.7 mg/dL — ABNORMAL HIGH (ref 0.50–1.35)
GFR calc Af Amer: 48 mL/min — ABNORMAL LOW
GFR calc non Af Amer: 42 mL/min — ABNORMAL LOW
Glucose, Bld: 116 mg/dL — ABNORMAL HIGH (ref 70–99)
Potassium: 5.4 meq/L — ABNORMAL HIGH (ref 3.5–5.1)
Sodium: 139 meq/L (ref 135–145)

## 2010-12-16 LAB — HEMOGLOBIN AND HEMATOCRIT, BLOOD
HCT: 27.4 % — ABNORMAL LOW (ref 39.0–52.0)
Hemoglobin: 9 g/dL — ABNORMAL LOW (ref 13.0–17.0)

## 2010-12-16 LAB — GLUCOSE, CAPILLARY
Glucose-Capillary: 158 mg/dL — ABNORMAL HIGH (ref 70–99)
Glucose-Capillary: 146 mg/dL — ABNORMAL HIGH (ref 70–99)

## 2010-12-16 LAB — PHOSPHORUS: Phosphorus: 4.1 mg/dL (ref 2.3–4.6)

## 2010-12-16 LAB — PROTIME-INR: INR: 1.24 (ref 0.00–1.49)

## 2010-12-16 SURGERY — LAPAROTOMY, EXPLORATORY
Anesthesia: General | Site: Abdomen | Wound class: Clean Contaminated

## 2010-12-16 MED ORDER — FLUCONAZOLE IN SODIUM CHLORIDE 200-0.9 MG/100ML-% IV SOLN
200.0000 mg | Freq: Once | INTRAVENOUS | Status: AC
  Administered 2010-12-16: 200 mg via INTRAVENOUS
  Filled 2010-12-16: qty 100

## 2010-12-16 MED ORDER — PROPOFOL 10 MG/ML IV EMUL: 5.0000 ug/kg/min | INTRAVENOUS | Status: DC

## 2010-12-16 MED ORDER — BIOTENE DRY MOUTH MT LIQD
15.0000 mL | Freq: Four times a day (QID) | OROMUCOSAL | Status: DC
  Administered 2010-12-16 – 2010-12-27 (×42): 15 mL via OROMUCOSAL

## 2010-12-16 MED ORDER — FLUCONAZOLE 100MG IVPB
100.0000 mg | INTRAVENOUS | Status: DC
  Administered 2010-12-17 – 2010-12-22 (×6): 100 mg via INTRAVENOUS
  Filled 2010-12-16 (×9): qty 50

## 2010-12-16 MED ORDER — SODIUM CHLORIDE 0.9 % IV SOLN
INTRAVENOUS | Status: DC
  Administered 2010-12-16: 10:00:00 via INTRAVENOUS
  Administered 2010-12-16 (×2): 500 mL via INTRAVENOUS
  Administered 2010-12-16 (×2): via INTRAVENOUS

## 2010-12-16 MED ORDER — SODIUM CHLORIDE 0.9 % IV SOLN
50.0000 ug/h | INTRAVENOUS | Status: DC
  Administered 2010-12-16: 175 ug/h via INTRAVENOUS
  Administered 2010-12-16: 75 ug/h via INTRAVENOUS
  Administered 2010-12-16: 100 ug/h via INTRAVENOUS
  Administered 2010-12-18 (×2): 300 ug/h via INTRAVENOUS
  Administered 2010-12-18: 100 ug/h via INTRAVENOUS
  Filled 2010-12-16 (×7): qty 50

## 2010-12-16 MED ORDER — METOPROLOL TARTRATE 1 MG/ML IV SOLN
2.5000 mg | INTRAVENOUS | Status: DC | PRN
  Administered 2010-12-16 – 2010-12-17 (×5): 5 mg via INTRAVENOUS
  Administered 2010-12-18: 2.5 mg via INTRAVENOUS
  Filled 2010-12-16 (×6): qty 5

## 2010-12-16 MED ORDER — SUCCINYLCHOLINE CHLORIDE 20 MG/ML IJ SOLN
INTRAMUSCULAR | Status: DC | PRN
  Administered 2010-12-16: 100 mg via INTRAVENOUS

## 2010-12-16 MED ORDER — IPRATROPIUM BROMIDE 0.02 % IN SOLN
0.5000 mg | Freq: Four times a day (QID) | RESPIRATORY_TRACT | Status: DC
  Administered 2010-12-16 – 2010-12-27 (×41): 0.5 mg via RESPIRATORY_TRACT
  Filled 2010-12-16 (×42): qty 2.5

## 2010-12-16 MED ORDER — SODIUM CHLORIDE 0.9 % IV BOLUS (SEPSIS): 1000.0000 mL | Freq: Once | INTRAVENOUS | Status: DC

## 2010-12-16 MED ORDER — INSULIN ASPART 100 UNIT/ML ~~LOC~~ SOLN
0.0000 [IU] | SUBCUTANEOUS | Status: DC
  Administered 2010-12-16 – 2010-12-19 (×7): 2 [IU] via SUBCUTANEOUS
  Administered 2010-12-19: 3 [IU] via SUBCUTANEOUS
  Administered 2010-12-19 (×4): 2 [IU] via SUBCUTANEOUS
  Administered 2010-12-20: 3 [IU] via SUBCUTANEOUS
  Administered 2010-12-20: 2 [IU] via SUBCUTANEOUS
  Administered 2010-12-20 (×3): 3 [IU] via SUBCUTANEOUS
  Administered 2010-12-20: 2 [IU] via SUBCUTANEOUS
  Administered 2010-12-20 – 2010-12-22 (×8): 3 [IU] via SUBCUTANEOUS
  Administered 2010-12-22: 5 [IU] via SUBCUTANEOUS
  Administered 2010-12-22 – 2010-12-23 (×5): 3 [IU] via SUBCUTANEOUS
  Administered 2010-12-23: 5 [IU] via SUBCUTANEOUS
  Administered 2010-12-23: 3 [IU] via SUBCUTANEOUS
  Administered 2010-12-23: 5 [IU] via SUBCUTANEOUS
  Administered 2010-12-24 (×6): 3 [IU] via SUBCUTANEOUS
  Administered 2010-12-25 – 2010-12-27 (×12): 2 [IU] via SUBCUTANEOUS
  Administered 2010-12-27: 3 [IU] via SUBCUTANEOUS
  Administered 2010-12-28: 2 [IU] via SUBCUTANEOUS
  Filled 2010-12-16: qty 3

## 2010-12-16 MED ORDER — ONDANSETRON HCL 4 MG/2ML IJ SOLN: 4.0000 mg | Freq: Four times a day (QID) | INTRAMUSCULAR | Status: DC | PRN

## 2010-12-16 MED ORDER — ROCURONIUM BROMIDE 100 MG/10ML IV SOLN
INTRAVENOUS | Status: DC | PRN
  Administered 2010-12-16: 50 mg via INTRAVENOUS

## 2010-12-16 MED ORDER — PROPOFOL 10 MG/ML IV EMUL
INTRAVENOUS | Status: DC | PRN
  Administered 2010-12-16 (×2): 100 mg via INTRAVENOUS

## 2010-12-16 MED ORDER — HYDROMORPHONE HCL PF 1 MG/ML IJ SOLN: 0.2500 mg | INTRAMUSCULAR | Status: DC | PRN

## 2010-12-16 MED ORDER — SODIUM CHLORIDE 0.9 % IV SOLN
INTRAVENOUS | Status: DC | PRN
  Administered 2010-12-16 (×2): via INTRAVENOUS

## 2010-12-16 MED ORDER — HEPARIN SODIUM (PORCINE) 5000 UNIT/ML IJ SOLN
5000.0000 [IU] | Freq: Three times a day (TID) | INTRAMUSCULAR | Status: DC
  Administered 2010-12-16 – 2010-12-28 (×35): 5000 [IU] via SUBCUTANEOUS
  Filled 2010-12-16 (×40): qty 1

## 2010-12-16 MED ORDER — PIPERACILLIN-TAZOBACTAM 3.375 G IVPB
3.3750 g | Freq: Three times a day (TID) | INTRAVENOUS | Status: DC
  Administered 2010-12-16 – 2010-12-23 (×23): 3.375 g via INTRAVENOUS
  Filled 2010-12-16 (×27): qty 50

## 2010-12-16 MED ORDER — PHENYLEPHRINE HCL 10 MG/ML IJ SOLN
INTRAMUSCULAR | Status: DC | PRN
  Administered 2010-12-16: 40 ug via INTRAVENOUS
  Administered 2010-12-16: 80 ug via INTRAVENOUS

## 2010-12-16 MED ORDER — HETASTARCH-ELECTROLYTES 6 % IV SOLN
INTRAVENOUS | Status: DC | PRN
  Administered 2010-12-16: 02:00:00 via INTRAVENOUS

## 2010-12-16 MED ORDER — ROCURONIUM BROMIDE 50 MG/5ML IV SOLN
70.0000 mg | Freq: Once | INTRAVENOUS | Status: AC
  Administered 2010-12-16: 70 mg via INTRAVENOUS
  Filled 2010-12-16: qty 7

## 2010-12-16 MED ORDER — INSULIN ASPART 100 UNIT/ML ~~LOC~~ SOLN
0.0000 [IU] | Freq: Three times a day (TID) | SUBCUTANEOUS | Status: DC
  Filled 2010-12-16: qty 3

## 2010-12-16 MED ORDER — PROMETHAZINE HCL 25 MG/ML IJ SOLN: 6.2500 mg | INTRAMUSCULAR | Status: DC | PRN

## 2010-12-16 MED ORDER — SODIUM CHLORIDE 0.9 % IV SOLN
2.0000 mg/h | INTRAVENOUS | Status: DC
  Administered 2010-12-16: 3 mg/h via INTRAVENOUS
  Administered 2010-12-16: 2 mg/h via INTRAVENOUS
  Administered 2010-12-16 – 2010-12-17 (×3): 3 mg/h via INTRAVENOUS
  Administered 2010-12-18: 2 mg/h via INTRAVENOUS
  Filled 2010-12-16 (×7): qty 10

## 2010-12-16 MED ORDER — CHLORHEXIDINE GLUCONATE 0.12 % MT SOLN
15.0000 mL | Freq: Two times a day (BID) | OROMUCOSAL | Status: DC
  Administered 2010-12-16 – 2010-12-21 (×12): 15 mL via OROMUCOSAL
  Filled 2010-12-16 (×13): qty 15

## 2010-12-16 MED ORDER — FENTANYL CITRATE 0.05 MG/ML IJ SOLN
INTRAMUSCULAR | Status: DC | PRN
  Administered 2010-12-16 (×2): 100 ug via INTRAVENOUS

## 2010-12-16 MED ORDER — FENTANYL BOLUS VIA INFUSION
50.0000 ug | Freq: Four times a day (QID) | INTRAVENOUS | Status: DC | PRN
  Administered 2010-12-17: 100 ug via INTRAVENOUS
  Filled 2010-12-16: qty 100

## 2010-12-16 MED ORDER — LEVALBUTEROL HCL 0.63 MG/3ML IN NEBU
0.6300 mg | INHALATION_SOLUTION | Freq: Four times a day (QID) | RESPIRATORY_TRACT | Status: DC
  Administered 2010-12-16 – 2010-12-27 (×40): 0.63 mg via RESPIRATORY_TRACT
  Filled 2010-12-16 (×50): qty 3

## 2010-12-16 MED ORDER — MIDAZOLAM HCL 5 MG/5ML IJ SOLN
INTRAMUSCULAR | Status: DC | PRN
  Administered 2010-12-16: 2 mg via INTRAVENOUS

## 2010-12-16 MED ORDER — METOPROLOL TARTRATE 1 MG/ML IV SOLN: 2.5000 mg | INTRAVENOUS | Status: DC | PRN

## 2010-12-16 MED ORDER — PANTOPRAZOLE SODIUM 40 MG IV SOLR
40.0000 mg | Freq: Two times a day (BID) | INTRAVENOUS | Status: DC
  Administered 2010-12-16 – 2010-12-27 (×23): 40 mg via INTRAVENOUS
  Filled 2010-12-16 (×25): qty 40

## 2010-12-16 MED ORDER — SODIUM CHLORIDE 0.9 % IV SOLN
INTRAVENOUS | Status: DC
  Administered 2010-12-16: 12:00:00 via INTRAVENOUS
  Administered 2010-12-17: 10 mL via INTRAVENOUS

## 2010-12-16 SURGICAL SUPPLY — 47 items
BLADE SURG ROTATE 9660 (MISCELLANEOUS) ×1 IMPLANT
CHLORAPREP W/TINT 26ML (MISCELLANEOUS) ×2 IMPLANT
CLOTH BEACON ORANGE TIMEOUT ST (SAFETY) ×2 IMPLANT
COVER SURGICAL LIGHT HANDLE (MISCELLANEOUS) ×2 IMPLANT
DRAIN CHANNEL 19F RND (DRAIN) ×2 IMPLANT
DRAPE LAPAROSCOPIC ABDOMINAL (DRAPES) ×2 IMPLANT
DRAPE WARM FLUID 44X44 (DRAPE) ×2 IMPLANT
DRSG VAC ATS LRG SENSATRAC (GAUZE/BANDAGES/DRESSINGS) ×1 IMPLANT
ELECT BLADE 6.5 EXT (BLADE) ×1 IMPLANT
ELECT REM PT RETURN 9FT ADLT (ELECTROSURGICAL) ×2
ELECTRODE REM PT RTRN 9FT ADLT (ELECTROSURGICAL) ×1 IMPLANT
EVACUATOR SILICONE 100CC (DRAIN) ×2 IMPLANT
GLOVE BIO SURGEON STRL SZ 6.5 (GLOVE) ×2 IMPLANT
GLOVE BIO SURGEON STRL SZ7 (GLOVE) ×3 IMPLANT
GLOVE BIOGEL PI IND STRL 6.5 (GLOVE) IMPLANT
GLOVE BIOGEL PI IND STRL 7.5 (GLOVE) ×1 IMPLANT
GLOVE BIOGEL PI INDICATOR 6.5 (GLOVE) ×1
GLOVE BIOGEL PI INDICATOR 7.5 (GLOVE) ×1
GOWN STRL NON-REIN LRG LVL3 (GOWN DISPOSABLE) ×5 IMPLANT
KIT BASIN OR (CUSTOM PROCEDURE TRAY) ×2 IMPLANT
KIT ROOM TURNOVER OR (KITS) ×2 IMPLANT
LIGASURE IMPACT 36 18CM CVD LR (INSTRUMENTS) IMPLANT
MANIFOLD NEPTUNE II (INSTRUMENTS) ×1 IMPLANT
NS IRRIG 1000ML POUR BTL (IV SOLUTION) ×12 IMPLANT
PACK GENERAL/GYN (CUSTOM PROCEDURE TRAY) ×2 IMPLANT
PAD ARMBOARD 7.5X6 YLW CONV (MISCELLANEOUS) ×4 IMPLANT
SPECIMEN JAR X LARGE (MISCELLANEOUS) IMPLANT
SPONGE GAUZE 4X4 12PLY (GAUZE/BANDAGES/DRESSINGS) ×1 IMPLANT
SPONGE LAP 18X18 X RAY DECT (DISPOSABLE) ×2 IMPLANT
STAPLER VISISTAT 35W (STAPLE) ×3 IMPLANT
SUCTION POOLE TIP (SUCTIONS) ×1 IMPLANT
SUT ETHILON 2 0 FS 18 (SUTURE) ×2 IMPLANT
SUT PDS AB 1 TP1 96 (SUTURE) ×2 IMPLANT
SUT SILK 2 0 (SUTURE) ×2
SUT SILK 2 0 SH CR/8 (SUTURE) ×3 IMPLANT
SUT SILK 2-0 18XBRD TIE 12 (SUTURE) ×1 IMPLANT
SUT SILK 3 0 (SUTURE) ×2
SUT SILK 3 0 SH CR/8 (SUTURE) ×1 IMPLANT
SUT SILK 3-0 18XBRD TIE 12 (SUTURE) ×1 IMPLANT
SUT VIC AB 3-0 SH 27 (SUTURE)
SUT VIC AB 3-0 SH 27X BRD (SUTURE) IMPLANT
TAPE HYPAFIX 4 X10 (GAUZE/BANDAGES/DRESSINGS) ×1 IMPLANT
TOWEL OR 17X24 6PK STRL BLUE (TOWEL DISPOSABLE) ×2 IMPLANT
TOWEL OR 17X26 10 PK STRL BLUE (TOWEL DISPOSABLE) ×2 IMPLANT
TRAY FOLEY CATH 14FRSI W/METER (CATHETERS) ×1 IMPLANT
WATER STERILE IRR 1000ML POUR (IV SOLUTION) ×1 IMPLANT
YANKAUER SUCT BULB TIP NO VENT (SUCTIONS) ×1 IMPLANT

## 2010-12-16 NOTE — Consult Note (Signed)
Name: Harry James MRN: [id number] DOB: [birth year]    LOS: 1  PCCM ADMISSION NOTE  PT PROFILE: Admitted to ICU post laparotomy for perforated duodenal ulcer. Left intubated due to critical status and comorbidities. PMH of htn, DM and smoking  History of Present Illness: This is a 60 year old male who has recently had a fractured right humeral neck. He has been taking some pain medication as well as what sounds like some nonsteroidals. It is not really appear even been taking a lot of these. He woke up with abdominal pain yesterday and it has progressively worsened. He has no nausea or vomiting. He has been passing gas and having bowel movements. The last time he really ate was Wednesday night due to the pain. He has no fevers. He has no prior history. He presented to the emergency room at outside hospital and appeared to have a perforated viscus by CT scan. He was unable to go surgery there due to his comorbidities and availability of an anesthesiologist. He was then transferred to our facility for definitive therapy. He does not have any prior history of ulcer disease.   Lines / Drains: ETT 12/1 >>> L Eclectic TLC 12/1>>> L radial a-line 12/1>>> Foley 12/1>>>  Cultures: Blood 12/1>>>  Antibiotics: Unasyn (Per [facility]) 12/1 >>12/1 Zosyn 12/1>>>  Tests / Events: 12/1 to OR to repair ruptured viscus.  The patient is sedated, intubated and unable to provide history, which was obtained for available medical records.    Past Medical History  Diagnosis Date  . Diabetes mellitus   . Hypertension    Past Surgical History  Procedure Date  . Cystectomy under tongue   Prior to Admission medications   Not on File   Allergies No Known Allergies  Family History History reviewed. No pertinent family history.  Social History  reports that he has been smoking Cigarettes.  He has been smoking about 1 pack per day. He does not have any smokeless tobacco history on file. He reports that he  drinks alcohol. He reports that he does not use illicit drugs.  Review Of Systems  11 points review of systems is negative with an exception of listed in HPI.  Vital Signs: Temp:  [97.3 F (36.3 C)-99 F (37.2 C)] 97.8 F (36.6 C) (11/30 2251) Pulse Rate:  [82-160] 160  (12/01 0256) Resp:  [16-24] 18  (11/30 2251) BP: (104-183)/(47-83) 183/83 mmHg (12/01 0256) SpO2:  [95 %-99 %] 98 % (12/01 0256) FiO2 (%):  [100 %] 100 % (12/01 0256) Weight:  [95.255 kg (210 lb)] 210 lb (95.255 kg) (11/30 1728)    Physical Examination: General:  Sedated and intubated obese male. Neuro:  Sedated, intubated, moving all ext spontaneously.   HEENT:  [place]/AT, PERRL, EOM-spontaneous, -LAN and -thyromegally. Cardiovascular:  RRR, Nl S1/S2, -M/R/G. Lungs:  Coarse BS diffusely. Abdomen:  Soft, NT, ND and -BS. Skin:  Spider anbiomas on upper chest.  BMET    Component Value Date/Time   NA 137 12/16/2010 0700   K 5.0 12/16/2010 0700   CL 107 12/16/2010 0700   CO2 19 12/16/2010 0700   GLUCOSE 137* 12/16/2010 0700   BUN 53* 12/16/2010 0700   CREATININE 1.78* 12/16/2010 0700   CALCIUM 7.6* 12/16/2010 0700   GFRNONAA 39* 12/16/2010 0700   GFRAA 46* 12/16/2010 0700   CBC    Component Value Date/Time   WBC 33.0* 12/16/2010 0700   RBC 3.13* 12/16/2010 0700   HGB 9.5* 12/16/2010 0700   HCT 29.2* 12/16/2010  0700   PLT 260 12/16/2010 0700   MCV 93.3 12/16/2010 0700   MCH 30.4 12/16/2010 0700   MCHC 32.5 12/16/2010 0700   RDW 13.4 12/16/2010 0700   LYMPHSABS 0.6* 12/15/2010 1753   MONOABS 0.6 12/15/2010 1753   EOSABS 0.0 12/15/2010 1753   BASOSABS 0.0 12/15/2010 1753   Ventilator settings: Vent Mode:  [-] PRVC FiO2 (%):  [100 %] 100 % Set Rate:  [15 bmp] 15 bmp Vt Set:  [500 mL] 500 mL PEEP:  [5 cmH20] 5 cmH20 Plateau Pressure:  [18 cmH20] 18 cmH20  Labs and Imaging:  Reviewed.  Please refer to the Assessment and Plan section for relevant results.  Assessment and Plan: 1) Vent dep resp failure post laparotomy  - now with pulmonary edema as well. Plan: Full vent support.  ABG now to adjust vent accordingly.   2) Hypotension: developing septic shock, on pressors due to perfed GI tract. Plan: Place A-line to accurately check BP (cuff on leg).  Continue levophed.  Check cortisol level.  Check CVP.  CXR post line placement.  Decrease sedation as tolerated.  2) DMII - ICU HG protocol.  3) tachycardia - Once BP is more stable will need beta blocker.  Seems more stable with sedation.  4) Peritonitis: Change unasyn to zosyn and f/u on cultures.  Best practices / Disposition: -->ICU status under [facility], PCCM consulting -->full code -->SCDs for DVT Px -->Protonix for GI Px -->ventilator bundle -->NPO -->family updated at bedside - Patient was a very difficult airway and is reaching for ETT.  Self extubation would be disastrous for him.  Will keep hands restrained.  The patient is critically ill with multiple organ systems failure and requires high complexity decision making for assessment and support, frequent evaluation and titration of therapies, application of advanced monitoring technologies and extensive interpretation of multiple databases. Critical Care Time devoted to patient care services described in this note is 45 minutes.  Jennet Maduro, M.D.

## 2010-12-16 NOTE — Progress Notes (Signed)
Day of Surgery  Subjective: Agitated and requiring a good deal of sedation to keep intubated.  Central line place this AM Sedated with Versed, and fentanyl.  2 JP drains, serosanguneous    Objective: Vital signs in last 24 hours: Temp:  [97.3 F (36.3 C)-99.4 F (37.4 C)] 98.4 F (36.9 C) (12/01 1146) Pulse Rate:  [82-174] 109  (12/01 1300) Resp:  [16-26] 23  (12/01 1300) BP: (65-183)/(36-83) 94/50 mmHg (12/01 1030) SpO2:  [92 %-100 %] 100 % (12/01 1300) FiO2 (%):  [70 %-100 %] 70 % (12/01 1349) Weight:  [95.255 kg (210 lb)-99.3 kg (218 lb 14.7 oz)] 218 lb 14.7 oz (99.3 kg) (12/01 0357)    Intake/Output from previous day: 11/30 0701 - 12/01 0700 In: 2636.2 [I.V.:1961.2; IV Piggyback:650] Out: 1485 [Urine:1350; Blood:135] Intake/Output this shift: Total I/O In: 1689.8 [I.V.:1639.8; Other:50] Out: [id number]  General appearance: currently sedated on vent. GI: obese, distended, 2 drains and wound vac in place.  Lab Results:   Basename 12/16/10 1134 12/16/10 0700  WBC 28.1* 33.0*  HGB 9.1* 9.5*  HCT 27.8* 29.2*  PLT 243 260    BMET  Basename 12/16/10 1134 12/16/10 0700  NA 139 137  K 5.4* 5.0  CL 110 107  CO2 20 19  GLUCOSE 116* 137*  BUN 51* 53*  CREATININE 1.70* 1.78*  CALCIUM 7.7* 7.6*   PT/INR  Basename 12/16/10 1134  LABPROT 15.9*  INR 1.24     Studies/Results: Ct Abdomen Pelvis W Contrast  12/15/2010  *RADIOLOGY REPORT*  Clinical Data: Abdominal pain.  CT ABDOMEN AND PELVIS WITH CONTRAST  Technique:  Multidetector CT imaging of the abdomen and pelvis was performed following the standard protocol during bolus administration of intravenous contrast.  Contrast: 98mL OMNIPAQUE IOHEXOL 300 MG/ML IV SOLN  Comparison: 12/08/2002  Findings: Chronic aortic dissection is again noted.  Aorta now aneurysmal infrarenally measuring up to 3.9 cm compared 2.5 cm previously.  Dissection continues into the left common iliac artery.  Previously, the smaller lumen  was felt to represent the false lumen.  If this is the case, the true lumen is occluded in the left common iliac artery with the false lumen remaining open and supplying the left internal iliac artery and external iliac artery.  Left common iliac artery is aneurysmal at 2.3 cm.  Mild aneurysmal dilatation of the right common iliac artery measuring 2.4 cm.  There is free air within the abdomen, anterior to the liver and continuing inferiorly.  Exact cause of free air is not visualized, but may be gastric in origin as the majority of the free fluid is near the stomach.  There is a suggestion of possible wall thickening along the lesser curvature and in the distal stomach. Remainder of the large and small bowel are unremarkable.  Appendix is visualized and is normal.  No free fluid or adenopathy.  Liver, spleen, pancreas, adrenals and kidneys are unremarkable.  Lung bases are clear.  Heart is borderline in size.  IMPRESSION: Moderate pneumoperitoneum.  Question gastric origin.  Exact cause is not visualized but findings are compatible with bowel rupture.  Chronic aortic dissection.  New distal aortic and common iliac artery aneurysms.  The distal aorta measures maximally 3.9 cm.  The true lumen is occluded in the left common iliac artery.  So that the  These results were discussed with Dr. Roderic Palau at the time of interpretation.  Original Report Authenticated By: Raelyn Number, M.D.   Dg Chest Port 1 View  12/16/2010  *  RADIOLOGY REPORT*  Clinical Data: Endotracheal tube.  PORTABLE CHEST - 1 VIEW  Comparison: 12/16/2010  Findings: Endotracheal tube is approximately 5.7 cm above the carina.  There is persistent volume loss in the left hemithorax probably representing lobar collapse and possibly pleural fluid. Right lung remains clear. Left subclavian central line in the SVC region.  No evidence for pneumothorax.  Nasogastric tube extends into the abdomen.  IMPRESSION: Support apparatuses as described.  Volume loss in  the left hemothorax and marked change from 12/15/2010.  Findings probably represent lobar collapse and possibly pleural fluid.  Original Report Authenticated By: Markus Daft, M.D.   Dg Chest Port 1 View  12/16/2010  *RADIOLOGY REPORT*  Clinical Data: Line placement  PORTABLE CHEST - 1 VIEW  Comparison: Plain film 12/16/2010 at 7:31 hours  Findings: Endotracheal tube is unchanged.  There is interval uncoiling of the NG tube which is in the stomach.  A left central venous line has been placed with the distal SVC.  No evidence of left pneumothorax.  There is volume loss in left hemithorax with pleural fluid and air space disease left upper lobe.  IMPRESSION:  1.  Interval placement left central venous line without complication. 2.  Lower lobe atelectasis, effusion, and air space disease in the upper lobe. 3.  Straightening of the NG tube.  Original Report Authenticated By: Suzy Bouchard, M.D.   Dg Chest Port 1 View  12/16/2010  *RADIOLOGY REPORT*  Clinical Data: NG tube  PORTABLE CHEST - 1 VIEW  Comparison: 12/16/2010  Findings: Endotracheal tube 4.8 cm above the carina.  NG tube is coiled old and projects over the mid thoracic area presumably within the mid esophagus region.  Heart remains enlarged with vascular congestion and dense left lower lobe/lingula collapse / consolidation.  Left effusion evident.  No pneumothorax.  IMPRESSION: NG tube coiled over the mid thoracic area presumably within the mid- esophagus.  Recommend complete removal and reattempting placement.  Otherwise stable portable chest exam findings.  Findings called to Truddie Crumble, ICU nurse caring for the patient  Original Report Authenticated By: M. Daryll Brod, M.D.   Portable Chest X-ray 1 View  12/16/2010  *RADIOLOGY REPORT*  Clinical Data: Endotracheal tube placement, status post cardiac catheterization.  PORTABLE CHEST - 1 VIEW  Comparison: Chest radiograph performed 12/15/2010  Findings: The patient's endotracheal tube is seen ending 5-6  cm above the carina.  An enteric tube is seen ending at the mid esophagus.  This should be advanced at least 18 cm.  There is dense retrocardiac airspace opacification, most likely reflecting atelectasis.  Mild right basilar opacity is also seen. Vascular congestion is noted.  No definite pleural effusion or pneumothorax is identified.  The cardiomediastinal silhouette is mildly enlarged.  No acute osseous abnormalities are seen.  IMPRESSION:  1.  Endotracheal tube seen ending 5-6 cm above the carina; this could be advanced 1-2 cm, as deemed clinically appropriate. 2.  Enteric tube noted ending at the mid esophagus; this should be advanced at least 18 cm. 3.  Dense retrocardiac airspace opacification most likely reflects atelectasis; mild right basilar airspace opacity also seen. Vascular congestion noted. 4.  Mild cardiomegaly.  Findings were discussed with Sherlene Shams on [phone] at 05:49 a.m. on 12/16/2010.  Original Report Authenticated By: Santa Lighter, M.D.   Dg Chest Portable 1 View  12/15/2010  *RADIOLOGY REPORT*  Clinical Data: Chest pain.  PORTABLE CHEST - 1 VIEW  Comparison: Chest CT 12/08/2002.  Findings: The heart is mildly enlarged.  The mediastinal and hilar contours are within normal limits.  The lungs are clear.  No pleural effusion.  The bony thorax is intact. A right humeral neck fracture is noted.  IMPRESSION: No acute cardiopulmonary findings. Right humeral neck fracture.  Original Report Authenticated By: P. Kalman Jewels, M.D.   Dg Humerus Right  12/16/2010  *RADIOLOGY REPORT*  Clinical Data: Right humerus fracture  RIGHT HUMERUS - 2+ VIEW  Comparison: 12/15/2010  Findings: Proximal right humerus surgical neck fracture noted with minimal displacement and probable slight impaction.  No associated subluxation or dislocation at the glenohumeral joint.  AC joint aligned.  Distal humerus at the elbow intact.  IMPRESSION: Acute proximal humerus surgical neck fracture.  Original Report  Authenticated By: Jerilynn Mages. Daryll Brod, M.D.    Anti-infectives: Anti-infectives     Start     Dose/Rate Route Frequency Ordered Stop   12/17/10 0800   fluconazole (DIFLUCAN) IVPB 100 mg        100 mg 50 mL/hr over 60 Minutes Intravenous Every 24 hours 12/16/10 0334     12/16/10 0600  piperacillin-tazobactam (ZOSYN) IVPB 3.375 g       3.375 g 12.5 mL/hr over 240 Minutes Intravenous 3 times per day 12/16/10 0320     12/16/10 0500   fluconazole (DIFLUCAN) IVPB 200 mg        200 mg 100 mL/hr over 60 Minutes Intravenous  Once 12/16/10 0334 12/16/10 0613   12/15/10 2030   ampicillin-sulbactam (UNASYN) 1.5 g in sodium chloride 0.9 % 50 mL IVPB        1.5 g 100 mL/hr over 30 Minutes Intravenous  Once 12/15/10 2027 12/15/10 2127         Current Facility-Administered Medications  Medication Dose Route Frequency Provider Last Rate Last Dose  . 0.9 %  sodium chloride infusion   Intravenous Once Maudry Diego, MD 125 mL/hr at 12/15/10 2153    . 0.9 %  sodium chloride infusion   Intravenous Continuous [name], MD 125 mL/hr at 12/16/10 1000    . 0.9 %  sodium chloride infusion   Intravenous Continuous Wesam Yacoub 10 mL/hr at 12/16/10 1215    . ampicillin-sulbactam (UNASYN) 1.5 g in sodium chloride 0.9 % 50 mL IVPB  1.5 g Intravenous Once Mark A Jenkins 100 mL/hr at 12/15/10 2057 1.5 g at 12/15/10 2057  . antiseptic oral rinse (BIOTENE) solution 15 mL  15 mL Mouth Rinse QID [name], MD   15 mL at 12/16/10 1211  . chlorhexidine (PERIDEX) 0.12 % solution 15 mL  15 mL Mouth Rinse BID [name], MD   15 mL at 12/16/10 0744  . fentaNYL (SUBLIMAZE) 10 mcg/mL in sodium chloride 0.9 % 250 mL infusion  50-400 mcg/hr Intravenous Titrated Merton Border, MD 7.5 mL/hr at 12/16/10 1330 75 mcg/hr at 12/16/10 1330   And  . fentaNYL (SUBLIMAZE) bolus via infusion 50-100 mcg  50-100 mcg Intravenous Q6H PRN Merton Border, MD      . fluconazole (DIFLUCAN) IVPB 100 mg  100 mg Intravenous  Q24H Bronson Curb Abbott, PHARMD      . fluconazole (DIFLUCAN) IVPB 200 mg  200 mg Intravenous Once [employer] Abbott, PHARMD   200 mg at 12/16/10 [id number]  . heparin injection 5,000 Units  5,000 Units Subcutaneous Q8H [name], MD      . HYDROmorphone (DILAUDID) injection 1 mg  1 mg Intravenous Once Maudry Diego, MD   1 mg at 12/15/10 1836  . HYDROmorphone (DILAUDID)  injection 1 mg  1 mg Intravenous Once Maudry Diego, MD   1 mg at 12/15/10 1859  . HYDROmorphone (DILAUDID) injection 1 mg  1 mg Intravenous Once Maudry Diego, MD   1 mg at 12/15/10 2028  . HYDROmorphone (DILAUDID) injection 1 mg  1 mg Intravenous Once Maudry Diego, MD   1 mg at 12/15/10 2154  . insulin aspart (novoLOG) injection 0-15 Units  0-15 Units Subcutaneous Q4H Merton Border, MD   2 Units at 12/16/10 [phone]  . iohexol (OMNIPAQUE) 300 MG/ML injection 80 mL  80 mL Intravenous Once PRN Medication Radiologist   80 mL at 12/15/10 1935  . ipratropium (ATROVENT) nebulizer solution 0.5 mg  0.5 mg Nebulization Q6H Merton Border, MD   0.5 mg at 12/16/10 1347  . levalbuterol (XOPENEX) nebulizer solution 0.63 mg  0.63 mg Nebulization Q6H Merton Border, MD   0.63 mg at 12/16/10 1347  . metoprolol (LOPRESSOR) injection 2.5-5 mg  2.5-5 mg Intravenous Q3H PRN Merton Border, MD   5 mg at 12/16/10 0348  . midazolam (VERSED) 1 mg/mL in sodium chloride 0.9 % 50 mL infusion  2-5 mg/hr Intravenous Continuous Merton Border, MD 2 mL/hr at 12/16/10 1330 2 mg/hr at 12/16/10 1330  . ondansetron (ZOFRAN) injection 4 mg  4 mg Intravenous Once Maudry Diego, MD   4 mg at 12/15/10 1836  . ondansetron (ZOFRAN) injection 4 mg  4 mg Intravenous Q6H PRN [name], MD      . pantoprazole (PROTONIX) injection 40 mg  40 mg Intravenous Q12H [name], MD   40 mg at 12/16/10 1210  . piperacillin-tazobactam (ZOSYN) IVPB 3.375 g  3.375 g Intravenous Q8H Bronson Curb Abbott, PHARMD   3.375 g at 12/16/10 [id number]  . rocuronium  (ZEMURON) injection 70 mg  70 mg Intravenous Once Wesam Yacoub   70 mg at 12/16/10 1012  . sodium chloride 0.9 % bolus 1,000 mL  1,000 mL Intravenous Once [employer]      . DISCONTD: insulin aspart (novoLOG) injection 0-15 Units  0-15 Units Subcutaneous TID WC [name], MD      . DISCONTD: metoprolol (LOPRESSOR) injection 2.5-5 mg  2.5-5 mg Intravenous Q3H PRN Merton Border, MD      . DISCONTD: propofol (DIPRIVAN) 10 MG/ML infusion  5-50 mcg/kg/min Intravenous Titrated Merton Border, MD       Facility-Administered Medications Ordered in Other Encounters  Medication Dose Route Frequency Provider Last Rate Last Dose  . DISCONTD: 0.9 %  sodium chloride infusion    Continuous PRN Danna Hefty      . DISCONTD: fentaNYL (SUBLIMAZE) injection    PRN Danna Hefty   100 mcg at 12/16/10 0215  . DISCONTD: hetastarch in lactated electrolyte (HEXTEND) 6 % infusion    Continuous PRN Danna Hefty      . DISCONTD: midazolam (VERSED) 5 MG/5ML injection    PRN Danna Hefty   2 mg at 12/16/10 [id number]  . DISCONTD: phenylephrine (NEO-SYNEPHRINE) injection    PRN Danna Hefty   40 mcg at 12/16/10 0130  . DISCONTD: propofol (DIPRIVAN) 10 MG/ML infusion    PRN Danna Hefty   100 mg at 12/16/10 0135  . DISCONTD: rocuronium (ZEMURON) injection    PRN Danna Hefty   50 mg at 12/16/10 0125  . DISCONTD: succinylcholine (ANECTINE) injection    PRN Danna Hefty   100 mg at 12/16/10 [id number]    Assessment/Plan Perforated Duodenal Ulcer with  exploratory Lap, Graham Patch, late last night,/early AM. Right Humeral Fracture  Concern for withdrawal AODM by Hx. No meds reported on admission. Hx hypertension No meds on admission. Hypotension, tachycardia, elevated WBC, H/H stable Plan:Appreciate Critical care help.  Will continue current RX.  Follow closely.  LOS: 1 day    Harry James 12/16/2010

## 2010-12-16 NOTE — Op Note (Signed)
Preoperative diagnosis: Perforated viscus Postoperative diagnosis: Perforated duodenal ulcer  Procedure: Exploratory laparotomy with [name] patch of perforated duodenal ulcer Surgeon: Dr. Serita Grammes Asst.: Dr. Madilyn Hook  Estimated blood loss: 100 cc Drains: 19 French Blake drain near pylorus into 19 [place] drain in the left upper quadrant Complications: None Sponge and needle counts were correct x2 at end of operation Disposition: To ICU intubated Specimens: None  Indications: This is a 60 year old male who recently had a fractured right humeral neck. He has been taking some pain medication as well as nonsteroidals. He does not have a really a prior history of ulcer disease. He has now a little bit over 48 hours of abdominal pain that both by his exam and by CT scan appeared to be a perforated viscus. He was initially seen at an outside hospital and transferred here for anesthesia management. I evaluated him and recommended exploratory laparotomy. We discussed the risks and benefits of the procedure.  Procedure: After informed consent was obtained the patient was taken to the operating room. He already been administered 3 g of intravenous Unasyn. Sequential compression devices were placed on his lower extremities. He was then underwent general anesthesia without complication. His abdomen was prepped and draped in the standard sterile surgical fashion. A Foley catheter was inserted. A surgical timeout was performed.  An upper midline incision was then made and this was carried out through his peritoneum. Upon entering a large volume of purulent fluid present. The Bookwalter retractor was inserted. Upon insertion we noted that he had a perforated anterior duodenal ulcer. He had a  large amount of contamination in his bilateral upper quadrants of his abdomen. We used about 10 L of irrigation to wash this entire area out. We then approached the ulcer. The tissue was very friable. It was  about a 6 mm x 6 mm hole. It had clearly been there for over 24 hours. I made a tongue of omentum and then brought this up. I placed 4 2-0 silks sutures on either side of the ulcer. I then placed the omentum there is a Associate Professor. I then tied the stitches down. I tacked the end portion of the omentum also to the stomach as well. This adequately covered the entire area. We then irrigated more. I then placed a drain right next to the perforation in the repair. I also placed a drain in the left upper quadrant due to the significant amount of purulence and contamination that was present there. These drains were secured with 2-0 nylon. I then closed his abdomen with #1 PDS. I left the skin open and placed a vac sponge overlying. He tolerated this well. Due to his comorbidities as well as difficulty with intubation he would be sent to the ICU intubated.

## 2010-12-16 NOTE — ED Notes (Signed)
General surgeon at bedside.  

## 2010-12-16 NOTE — Procedures (Signed)
Central Venous Catheter Insertion Procedure Note Harry James [id number] 1950-07-30  Procedure: Insertion of Central Venous Catheter Indications: Drug and/or fluid administration  Procedure Details Consent: Risks of procedure as well as the alternatives and risks of each were explained to the (patient/caregiver).  Consent for procedure obtained. Time Out: Verified patient identification, verified procedure, site/side was marked, verified correct patient position, special equipment/implants available, medications/allergies/relevent history reviewed, required imaging and test results available.  Performed  Maximum sterile technique was used including antiseptics, cap, gloves, gown, hand hygiene, mask and sheet. Skin prep: Chlorhexidine; local anesthetic administered A antimicrobial bonded/coated triple lumen catheter was placed in the left subclavian vein using the Seldinger technique.  Evaluation Blood flow good Complications: No apparent complications Patient did tolerate procedure well. Chest X-ray ordered to verify placement.  CXR: pending.  Harry James 12/16/2010, 10:36 AM

## 2010-12-16 NOTE — Progress Notes (Signed)
Orthopedic Tech Progress Note Patient Details:  Harry James 1950-05-03 [id number]      Braulio Bosch 12/16/2010, 3:22 PM Applied sling and swathe

## 2010-12-16 NOTE — Addendum Note (Signed)
Addendum  created 12/16/10 0307 by Danna Hefty   Modules edited:Charges VN

## 2010-12-16 NOTE — Progress Notes (Addendum)
Patient becomes extremely agitated when spontaneously awakens from sedation. RN careful to bolus patient r/t hypotension. MD aware and 1L bolus ordered for hypotension. Per Dr. Nelda Marseille, and order was received to restrain patient because of his attempts to pull et tube out when awake. When patient has a rass of +3 he is unreasonable and inconsolable.  Dr. Nelda Marseille and multiple other staff has been at bedside assisting primary RN with holding patients arms down while RN can give patient additional sedation. For these reasons, and the fact that the patient will be a difficult reintubation per Dr. Nelda Marseille, he explained that a sitter would not be the best safty choice for this patient and a restraint order for bilateral soft wrist was carried out. Trish Fountain English RN

## 2010-12-16 NOTE — Transfer of Care (Signed)
Immediate Anesthesia Transfer of Care Note  Patient: Harry James  Procedure(s) Performed:  EXPLORATORY LAPAROTOMY  Patient Location: ICU  Anesthesia Type: General  Level of Consciousness: unresponsive  Airway & Oxygen Therapy: Patient remains intubated per anesthesia plan  Post-op Assessment: Post -op Vital signs reviewed and stable  Post vital signs: Reviewed and stable  Complications: No apparent anesthesia complications

## 2010-12-16 NOTE — Anesthesia Postprocedure Evaluation (Signed)
  Anesthesia Post-op Note  Patient: Harry James  Procedure(s) Performed:  EXPLORATORY LAPAROTOMY  Patient Location: ICU  Anesthesia Type: General  Level of Consciousness: unresponsive  Airway and Oxygen Therapy: Patient remains intubated per anesthesia plan  Post-op Pain: none  Post-op Assessment: Post-op Vital signs reviewed, Patient's Cardiovascular Status Stable and No signs of Nausea or vomiting  Post-op Vital Signs: Reviewed and stable  Complications: No apparent anesthesia complications

## 2010-12-16 NOTE — Progress Notes (Signed)
Dr. Alva Garnet notified of results of [place] and ET placement. No new order at this time.

## 2010-12-16 NOTE — Progress Notes (Signed)
Dr. Donne Hazel notified of NG tube 18cm from stomach, noted in mid esophagus. New order obtained.

## 2010-12-16 NOTE — Procedures (Signed)
Arterial Catheter Insertion Procedure Note INDIE MATKOVICH [id number] 04/29/1950  Procedure: Insertion of Arterial Catheter  Indications: Blood pressure monitoring  Procedure Details Consent: Risks of procedure as well as the alternatives and risks of each were explained to the (patient/caregiver).  Consent for procedure obtained. Time Out: Verified patient identification, verified procedure, site/side was marked, verified correct patient position, special equipment/implants available, medications/allergies/relevent history reviewed, required imaging and test results available.  Performed  Maximum sterile technique was used including antiseptics, cap, gloves, gown, hand hygiene, mask and sheet. Skin prep: Chlorhexidine; local anesthetic administered 20 gauge catheter was inserted into left radial artery using the Seldinger technique.  Evaluation Blood flow good; BP tracing good. Complications: No apparent complications.   Jakyah Bradby 12/16/2010

## 2010-12-16 NOTE — Preoperative (Signed)
Beta Blockers   Reason not to administer Beta Blockers:Not Applicable 

## 2010-12-16 NOTE — Progress Notes (Signed)
ANTIBIOTIC CONSULT NOTE - INITIAL  Pharmacy Consult for Zosyn/Fluconazole Indication: Perforated ulcer repair  No Known Allergies  Patient Measurements: Height: 5\' 6"  (167.6 cm) Weight: 210 lb (95.255 kg) IBW/kg (Calculated) : 63.8    Vital Signs: Temp: 97.8 F (36.6 C) (11/30 2251) Temp src: Oral (11/30 2251) BP: 183/83 mmHg (12/01 0256) Pulse Rate: 160  (12/01 0256) Intake/Output from previous day: 11/30 0701 - 12/01 0700 In: 2000 [I.V.:1500; IV Piggyback:500] Out: 985 [Urine:850; Blood:135] Intake/Output from this shift: Total I/O In: 2000 [I.V.:1500; IV Piggyback:500] Out: 985 [Urine:850; Blood:135]  Labs:  [facility] 12/15/10 1848 12/15/10 1753  WBC -- 23.7*  HGB 12.2* 11.3*  PLT -- 286  LABCREA -- --  CREATININE 1.90* 1.81*   Estimated Creatinine Clearance: 44.1 ml/min (by C-G formula based on Cr of 1.9). No results found for this basename: VANCOTROUGH:2,VANCOPEAK:2,VANCORANDOM:2,GENTTROUGH:2,GENTPEAK:2,GENTRANDOM:2,TOBRATROUGH:2,TOBRAPEAK:2,TOBRARND:2,AMIKACINPEAK:2,AMIKACINTROU:2,AMIKACIN:2, in the last 72 hours   Microbiology: No results found for this or any previous visit (from the past 720 hour(s)).  Medical History: Past Medical History  Diagnosis Date  . Diabetes mellitus   . Hypertension      Assessment: 60 yo male s/p repair of perforated duodenal ulcer for empiric antibiotics  Plan:  Zosyn 3.375 g IV q8h Fluconazole 200 mg IV now, then 100 mg IV daily  Toba Claudio, Bronson Curb 12/16/2010,3:27 AM

## 2010-12-16 NOTE — Progress Notes (Signed)
NG tube advanced slightly approximately 13cm as requested by Dr. Donne Hazel. No resistance met. Pt tolerated well. Distant air bolus ascultated over upper abd area. Awaiting on PCXR for confirmation.

## 2010-12-16 NOTE — Progress Notes (Signed)
Patient now with N/G replaced and central line. Still requiring sedation, VS Ok.  Harry James J 12/16/2010 3:13 PM

## 2010-12-16 NOTE — Addendum Note (Signed)
Addendum  created 12/16/10 0450 by Leda Quail, MD   Modules edited:Anesthesia Attestations

## 2010-12-16 NOTE — Addendum Note (Signed)
Addendum  created 12/16/10 0307 by Danna Hefty   Modules edited:Anesthesia Events, Charges VN

## 2010-12-16 NOTE — H&P (Addendum)
Harry James is an 60 y.o. male.  Consult from Dr. Verneda Skill Chief Complaint: abdominal pain HPI:  This is a 60 year old male who has recently had a fractured right humeral neck. He has been taking some pain medication as well as what sounds like some nonsteroidals. It is not really appear even been taking a lot of these. He woke up with abdominal pain yesterday and it has progressively worsened. He has no nausea or vomiting. He has been passing gas and having bowel movements. The last time he really ate was Wednesday night due to the pain. He has no fevers. He has no prior history. He presented to the emergency room at outside hospital and appeared to have a perforated viscus by CT scan. He was unable to go surgery there due to his comorbidities and availability of an anesthesiologist. He was then transferred to our facility for definitive therapy. He does not have any prior history of ulcer disease. Past Medical History  Diagnosis Date  . Diabetes mellitus   . Hypertension     Past Surgical History  Procedure Date  . Cystectomy under tongue    History reviewed. No pertinent family history. Social History:  reports that he has been smoking Cigarettes.  He has been smoking about 1 pack per day. He does not have any smokeless tobacco history on file. He reports that he drinks alcohol. He reports that he does not use illicit drugs.  Allergies: No Known Allergies  Medications Prior to Admission  Medication Dose Route Frequency Provider Last Rate Last Dose  . 0.9 %  sodium chloride infusion   Intravenous Once Maudry Diego, MD 125 mL/hr at 12/15/10 2153    . ampicillin-sulbactam (UNASYN) 1.5 g in sodium chloride 0.9 % 50 mL IVPB  1.5 g Intravenous Once Mark A Jenkins 100 mL/hr at 12/15/10 2057 1.5 g at 12/15/10 2057  . HYDROmorphone (DILAUDID) injection 1 mg  1 mg Intravenous Once Maudry Diego, MD   1 mg at 12/15/10 1836  . HYDROmorphone (DILAUDID) injection 1 mg  1 mg Intravenous Once  Maudry Diego, MD   1 mg at 12/15/10 1859  . HYDROmorphone (DILAUDID) injection 1 mg  1 mg Intravenous Once Maudry Diego, MD   1 mg at 12/15/10 2028  . HYDROmorphone (DILAUDID) injection 1 mg  1 mg Intravenous Once Maudry Diego, MD   1 mg at 12/15/10 2154  . iohexol (OMNIPAQUE) 300 MG/ML injection 80 mL  80 mL Intravenous Once PRN Medication Radiologist   80 mL at 12/15/10 1935  . ondansetron (ZOFRAN) injection 4 mg  4 mg Intravenous Once Maudry Diego, MD   4 mg at 12/15/10 1836   No current outpatient prescriptions on file as of 12/15/2010.    Results for orders placed during the hospital encounter of 12/15/10 (from the past 48 hour(s))  CBC     Status: Abnormal   Collection Time   12/15/10  5:53 PM      Component Value Range Comment   WBC 23.7 (*) 4.0 - 10.5 (K/uL)    RBC 3.65 (*) 4.22 - 5.81 (MIL/uL)    Hemoglobin 11.3 (*) 13.0 - 17.0 (g/dL)    HCT 34.1 (*) 39.0 - 52.0 (%)    MCV 93.4  78.0 - 100.0 (fL)    MCH 31.0  26.0 - 34.0 (pg)    MCHC 33.1  30.0 - 36.0 (g/dL)    RDW 13.3  11.5 - 15.5 (%)  Platelets 286  150 - 400 (K/uL)   DIFFERENTIAL     Status: Abnormal   Collection Time   12/15/10  5:53 PM      Component Value Range Comment   Neutrophils Relative 95 (*) 43 - 77 (%)    Neutro Abs 22.5 (*) 1.7 - 7.7 (K/uL)    Lymphocytes Relative 2 (*) 12 - 46 (%)    Lymphs Abs 0.6 (*) 0.7 - 4.0 (K/uL)    Monocytes Relative 3  3 - 12 (%)    Monocytes Absolute 0.6  0.1 - 1.0 (K/uL)    Eosinophils Relative 0  0 - 5 (%)    Eosinophils Absolute 0.0  0.0 - 0.7 (K/uL)    Basophils Relative 0  0 - 1 (%)    Basophils Absolute 0.0  0.0 - 0.1 (K/uL)   COMPREHENSIVE METABOLIC PANEL     Status: Abnormal   Collection Time   12/15/10  5:53 PM      Component Value Range Comment   Sodium 134 (*) 135 - 145 (mEq/L)    Potassium 5.1  3.5 - 5.1 (mEq/L)    Chloride 99  96 - 112 (mEq/L)    CO2 23  19 - 32 (mEq/L)    Glucose, Bld 133 (*) 70 - 99 (mg/dL)    BUN 57 (*) 6 - 23 (mg/dL)     Creatinine, Ser 1.81 (*) 0.50 - 1.35 (mg/dL)    Calcium 9.5  8.4 - 10.5 (mg/dL)    Total Protein 6.9  6.0 - 8.3 (g/dL)    Albumin 3.5  3.5 - 5.2 (g/dL)    AST 13  0 - 37 (U/L)    ALT 16  0 - 53 (U/L)    Alkaline Phosphatase 109  39 - 117 (U/L)    Total Bilirubin 0.5  0.3 - 1.2 (mg/dL)    GFR calc non Af Amer 39 (*) >90 (mL/min)    GFR calc Af Amer 45 (*) >90 (mL/min)   LIPASE, BLOOD     Status: Abnormal   Collection Time   12/15/10  5:53 PM      Component Value Range Comment   Lipase 61 (*) 11 - 59 (U/L)   POCT I-STAT, CHEM 8     Status: Abnormal   Collection Time   12/15/10  6:48 PM      Component Value Range Comment   Sodium 136  135 - 145 (mEq/L)    Potassium 5.2 (*) 3.5 - 5.1 (mEq/L)    Chloride 107  96 - 112 (mEq/L)    BUN 60 (*) 6 - 23 (mg/dL)    Creatinine, Ser 1.90 (*) 0.50 - 1.35 (mg/dL)    Glucose, Bld 132 (*) 70 - 99 (mg/dL)    Calcium, Ion 1.11 (*) 1.12 - 1.32 (mmol/L)    TCO2 21  0 - 100 (mmol/L)    Hemoglobin 12.2 (*) 13.0 - 17.0 (g/dL)    HCT 36.0 (*) 39.0 - 52.0 (%)   POCT I-STAT TROPONIN I     Status: Normal   Collection Time   12/15/10  7:05 PM      Component Value Range Comment   Troponin i, poc 0.01  0.00 - 0.08 (ng/mL)    Comment 3             Ct Abdomen Pelvis W Contrast  12/15/2010  *RADIOLOGY REPORT*  Clinical Data: Abdominal pain.  CT ABDOMEN AND PELVIS WITH CONTRAST  Technique:  Multidetector CT imaging  of the abdomen and pelvis was performed following the standard protocol during bolus administration of intravenous contrast.  Contrast: 89mL OMNIPAQUE IOHEXOL 300 MG/ML IV SOLN  Comparison: 12/08/2002  Findings: Chronic aortic dissection is again noted.  Aorta now aneurysmal infrarenally measuring up to 3.9 cm compared 2.5 cm previously.  Dissection continues into the left common iliac artery.  Previously, the smaller lumen was felt to represent the false lumen.  If this is the case, the true lumen is occluded in the left common iliac artery with the  false lumen remaining open and supplying the left internal iliac artery and external iliac artery.  Left common iliac artery is aneurysmal at 2.3 cm.  Mild aneurysmal dilatation of the right common iliac artery measuring 2.4 cm.  There is free air within the abdomen, anterior to the liver and continuing inferiorly.  Exact cause of free air is not visualized, but may be gastric in origin as the majority of the free fluid is near the stomach.  There is a suggestion of possible wall thickening along the lesser curvature and in the distal stomach. Remainder of the large and small bowel are unremarkable.  Appendix is visualized and is normal.  No free fluid or adenopathy.  Liver, spleen, pancreas, adrenals and kidneys are unremarkable.  Lung bases are clear.  Heart is borderline in size.  IMPRESSION: Moderate pneumoperitoneum.  Question gastric origin.  Exact cause is not visualized but findings are compatible with bowel rupture.  Chronic aortic dissection.  New distal aortic and common iliac artery aneurysms.  The distal aorta measures maximally 3.9 cm.  The true lumen is occluded in the left common iliac artery.  So that the  These results were discussed with Dr. Roderic Palau at the time of interpretation.  Original Report Authenticated By: Raelyn Number, M.D.   Dg Chest Portable 1 View  12/15/2010  *RADIOLOGY REPORT*  Clinical Data: Chest pain.  PORTABLE CHEST - 1 VIEW  Comparison: Chest CT 12/08/2002.  Findings: The heart is mildly enlarged.  The mediastinal and hilar contours are within normal limits.  The lungs are clear.  No pleural effusion.  The bony thorax is intact. A right humeral neck fracture is noted.  IMPRESSION: No acute cardiopulmonary findings. Right humeral neck fracture.  Original Report Authenticated By: P. Kalman Jewels, M.D.    Review of Systems  Constitutional: Negative for fever, chills and weight loss.  HENT: Negative for congestion.   Respiratory: Negative for cough, shortness of breath  and wheezing.   Cardiovascular: Negative for chest pain and palpitations.  Gastrointestinal: Positive for abdominal pain. Negative for nausea, vomiting, diarrhea, constipation and blood in stool.  Genitourinary: Negative for dysuria.  Neurological: Positive for weakness.    Blood pressure 125/69, pulse 109, temperature 97.8 F (36.6 C), temperature source Oral, resp. rate 18, height 5\' 6"  (1.676 m), weight 210 lb (95.255 kg), SpO2 97.00%. Physical Exam  Constitutional: He appears well-developed and well-nourished.  Eyes: No scleral icterus.  Neck: Neck supple.  Cardiovascular: Regular rhythm and normal heart sounds.  Tachycardia present.   Respiratory: Effort normal and breath sounds normal. He has no wheezes. He has no rales.  GI: Normal appearance. He exhibits no distension. Bowel sounds are decreased. There is tenderness in the left upper quadrant. There is rigidity and guarding. No hernia.  Lymphadenopathy:    He has no cervical adenopathy.     Assessment/Plan Clinically he has peritonitis on his examination. His CT scan is consistent with what appears to be  a possible gastric perforation which would also go along with the fact that he has mostly upper abdominal pain. I discussed with him going to the operating room as soon as possible for exploratory laparotomy. I discussed the possibility of patching the ulcer as well as excising the area depending on if it is a gastric or duodenal ulcer. I discussed the risks including bleeding, infection, reoperation, leak postoperatively, arrhythmias, myocardial infarction, and other medical risks that would be possible. We discussed the prolonged hospital stay also. This is an emergency we will proceed as soon as possible. He reported that he understands the necessity of this as well as the risks and benefits associated with it.  He also is noted to have a chronic aortic dissection which will be evaluated at some point during this hospitalization.  This allowed to wait until his perforation was repaired though.  Imir Brumbach 12/16/2010, 12:26 AM

## 2010-12-17 ENCOUNTER — Inpatient Hospital Stay (HOSPITAL_COMMUNITY): Payer: BC Managed Care – PPO

## 2010-12-17 DIAGNOSIS — R6521 Severe sepsis with septic shock: Secondary | ICD-10-CM

## 2010-12-17 DIAGNOSIS — J9600 Acute respiratory failure, unspecified whether with hypoxia or hypercapnia: Secondary | ICD-10-CM

## 2010-12-17 DIAGNOSIS — K659 Peritonitis, unspecified: Secondary | ICD-10-CM

## 2010-12-17 DIAGNOSIS — A419 Sepsis, unspecified organism: Secondary | ICD-10-CM

## 2010-12-17 DIAGNOSIS — R0902 Hypoxemia: Secondary | ICD-10-CM

## 2010-12-17 LAB — COMPREHENSIVE METABOLIC PANEL
ALT: 12 U/L (ref 0–53)
BUN: 30 mg/dL — ABNORMAL HIGH (ref 6–23)
CO2: 16 mEq/L — ABNORMAL LOW (ref 19–32)
Calcium: 6.4 mg/dL — CL (ref 8.4–10.5)
GFR calc Af Amer: 76 mL/min — ABNORMAL LOW (ref >90)
GFR calc non Af Amer: 66 mL/min — ABNORMAL LOW (ref >90)
Glucose, Bld: 72 mg/dL (ref 70–99)
Sodium: 140 mEq/L (ref 135–145)
Total Protein: 4.1 g/dL — ABNORMAL LOW (ref 6.0–8.3)

## 2010-12-17 LAB — GLUCOSE, CAPILLARY
Glucose-Capillary: 120 mg/dL — ABNORMAL HIGH (ref 70–99)
Glucose-Capillary: 85 mg/dL (ref 70–99)
Glucose-Capillary: 88 mg/dL (ref 70–99)

## 2010-12-17 LAB — BLOOD GAS, ARTERIAL
Acid-base deficit: 5.8 mmol/L — ABNORMAL HIGH (ref 0.0–2.0)
Drawn by: 31101
FIO2: 0.5 %
MECHVT: 500 mL
O2 Saturation: 98 %
PEEP: 5 cmH2O
Patient temperature: 98.6
RATE: 22 resp/min
pO2, Arterial: 147 mmHg — ABNORMAL HIGH (ref 80.0–100.0)

## 2010-12-17 LAB — CBC
HCT: 25.8 % — ABNORMAL LOW (ref 39.0–52.0)
Hemoglobin: 8.5 g/dL — ABNORMAL LOW (ref 13.0–17.0)
MCHC: 32.9 g/dL (ref 30.0–36.0)
RBC: 2.72 MIL/uL — ABNORMAL LOW (ref 4.22–5.81)

## 2010-12-17 LAB — TSH: TSH: 0.229 u[IU]/mL — ABNORMAL LOW (ref 0.350–4.500)

## 2010-12-17 LAB — HEMOGLOBIN A1C: Mean Plasma Glucose: 117 mg/dL — ABNORMAL HIGH (ref <117)

## 2010-12-17 MED ORDER — VANCOMYCIN HCL 1000 MG IV SOLR
1250.0000 mg | Freq: Two times a day (BID) | INTRAVENOUS | Status: DC
  Administered 2010-12-17 – 2010-12-19 (×5): 1250 mg via INTRAVENOUS
  Filled 2010-12-17 (×7): qty 1250

## 2010-12-17 MED ORDER — CLINIMIX E/DEXTROSE (5/15) 5 % IV SOLN
INTRAVENOUS | Status: AC
  Administered 2010-12-17: 19:00:00 via INTRAVENOUS
  Filled 2010-12-17: qty 1000

## 2010-12-17 MED ORDER — MAGNESIUM SULFATE BOLUS VIA INFUSION: 2.0000 g | Freq: Once | INTRAVENOUS | Status: DC

## 2010-12-17 MED ORDER — MAGNESIUM SULFATE 40 MG/ML IJ SOLN
2.0000 g | Freq: Once | INTRAMUSCULAR | Status: AC
  Administered 2010-12-17: 2 g via INTRAVENOUS
  Filled 2010-12-17: qty 50

## 2010-12-17 MED ORDER — MAGNESIUM SULFATE 40 MG/ML IJ SOLN
2.0000 g | Freq: Once | INTRAMUSCULAR | Status: DC
  Filled 2010-12-17: qty 50

## 2010-12-17 NOTE — Progress Notes (Signed)
CRITICAL VALUE ALERT  Critical value received:Ca++ 6.4  Date of notification:  12/17/2010  Time of notification:  [id number]  Critical value read back:yes  Nurse who received alert: Pollyann Samples  MD notified (1st page):  E-Link  Time of first page:  0449  MD notified (2nd page):  Time of second page:  Responding MD:  E-Link  Time MD responded:  [phone]

## 2010-12-17 NOTE — Progress Notes (Signed)
PARENTERAL NUTRITION CONSULT NOTE - FOLLOW UP  Pharmacy Consult for TPN Indication: perforated DU s/p patch repair  No Known Allergies  Patient Measurements: Height: 5\' 5"  (165.1 cm) Weight: 218 lb 14.7 oz (99.3 kg) IBW/kg (Calculated) : 61.5   Vital Signs: Temp: 99.2 F (37.3 C) (12/02 0400) Temp src: Oral (12/02 0400) BP: 113/39 mmHg (12/02 0738) Pulse Rate: 108  (12/02 0342) Intake/Output from previous day: 12/01 0701 - 12/02 0700 In: 2664.2 [I.V.:2539.2; IV Piggyback:75] Out: 2185 [Urine:1885; Emesis/NG output:250; Drains:50] Intake/Output from this shift:    Labs:  [facility] 12/17/10 0345 12/16/10 1800 12/16/10 1134 12/16/10 0700  WBC 17.2* -- 28.1* 33.0*  HGB 8.5* 9.0* 9.1* --  HCT 25.8* 27.4* 27.8* --  PLT 227 -- 243 260  APTT -- -- -- --  INR -- -- 1.24 --     Basename 12/17/10 0345 12/16/10 1134 12/16/10 0700 12/15/10 1753  NA 140 139 137 --  K 4.4 5.4* 5.0 --  CL 118* 110 107 --  CO2 16* 20 19 --  GLUCOSE 72 116* 137* --  BUN 30* 51* 53* --  CREATININE 1.17 1.70* 1.78* --  LABCREA -- -- -- --  CREAT24HRUR -- -- -- --  CALCIUM 6.4* 7.7* 7.6* --  MG 1.5 1.6 -- --  PHOS 2.8 4.1 -- --  PROT 4.1* -- 5.3* 6.9  ALBUMIN 1.7* -- 2.3* 3.5  AST 25 -- 14 13  ALT 12 -- 13 16  ALKPHOS 68 -- 79 109  BILITOT 0.9 -- 0.4 0.5  BILIDIR -- -- -- --  IBILI -- -- -- --  PREALBUMIN -- -- -- --  CHOLHDL -- -- -- --  CHOL -- -- -- --   Estimated Creatinine Clearance: 71.8 ml/min (by C-G formula based on Cr of 1.17).    Basename 12/17/10 0828 12/16/10 2351 12/16/10 2019  GLUCAP 85 85 70    Medications:  Scheduled:    . antiseptic oral rinse  15 mL Mouth Rinse QID  . chlorhexidine  15 mL Mouth Rinse BID  . fluconazole (DIFLUCAN) IV  100 mg Intravenous Q24H  . heparin  5,000 Units Subcutaneous Q8H  . insulin aspart  0-15 Units Subcutaneous Q4H  . ipratropium  0.5 mg Nebulization Q6H  . levalbuterol  0.63 mg Nebulization Q6H  . pantoprazole (PROTONIX) IV  40  mg Intravenous Q12H  . piperacillin-tazobactam (ZOSYN)  IV  3.375 g Intravenous Q8H  . rocuronium  70 mg Intravenous Once  . sodium chloride  1,000 mL Intravenous Once   Infusions:    . sodium chloride 125 mL/hr at 12/17/10 0500  . sodium chloride 10 mL (12/17/10 0539)  . fentaNYL infusion INTRAVENOUS 175 mcg/hr (12/17/10 0500)  . midazolam (VERSED) infusion 3 mg/hr (12/17/10 0537)    Insulin Requirements in the past 24 hours:  2 units SSI  Current Nutrition:  NPO  Assessment: 73 YOM s/p Ex-lap and patch repair of perforated DU on 12/1. Appears was taking NSAIDs for pain control following LE fracture.  He is currently intubated and sedated.  Appears to have h/o DM.  SCr was elevated at admit but improved.   F: NS 17ml/hr E: K=4.4 (slight hemolysis), K was elevated.       Mg=1.5, Phos = 2.8, Corr Ca = 8.24  Nutritional Goals:  Est needs: 90-100gm protein, 1600-1900 Kcals, await for RD evaluation for more definitive goals.  Plan:  1. Start TPN at ~50% est goals.  Will add lytes to TPN as K is  better and many others low.   2. Due to national backorder, lipids, MVI, and trace elements to be given MWF.  3. Follow CBGs for h/o DM and add insulin to TPN PRN 4.  TPN labs in am 5. Replace Mg, dont see urgent need to replace Ca as Corr Ca (corr for albumin) isnt that low.   Harry James 12/17/2010,9:07 AM

## 2010-12-17 NOTE — Consult Note (Signed)
No primary provider on file. Chief Complaint: Bleeding peptic ulcer.  History: This is a 60 year old male who has recently had a fractured right humeral neck. He has been taking some pain medication as well as what sounds like some nonsteroidals. It is not really appear even been taking a lot of these. He woke up with abdominal pain yesterday and it has progressively worsened. He has no nausea or vomiting. He has been passing gas and having bowel movements. The last time he really ate was Wednesday night due to the pain. He has no fevers. He has no prior history. He presented to the emergency room at outside hospital and appeared to have a perforated viscus by CT scan. He was unable to go surgery there due to his comorbidities and availability of an anesthesiologist. He was then transferred to our facility for definitive therapy.  He does not have any prior history of ulcer disease. Orthopedic consultation was requested since he does have a known humeral  fracture  Past Medical History  Diagnosis Date  . Diabetes mellitus   . Hypertension     No Known Allergies  No current facility-administered medications on file prior to encounter.   No current outpatient prescriptions on file prior to encounter.    Physical Exam: Filed Vitals:   12/17/10 1200  BP:   Pulse: 134  Temp: 101.3 F (38.5 C)  Resp: 22   patient is intubated in intensive care unit.  Clinical exam is limited due to a intubation.  Tablet refill in the upper extremity is less than 2 seconds.  2+ radial and ulnar nerve pulses.  Compartments are soft and nontender.  No gross deformity of the right shoulder. No crepitus or deformity at the elbow or wrist.   Image: Ct Abdomen Pelvis W Contrast  12/15/2010  *RADIOLOGY REPORT*  Clinical Data: Abdominal pain.  CT ABDOMEN AND PELVIS WITH CONTRAST  Technique:  Multidetector CT imaging of the abdomen and pelvis was performed following the standard protocol during bolus  administration of intravenous contrast.  Contrast: 55mL OMNIPAQUE IOHEXOL 300 MG/ML IV SOLN  Comparison: 12/08/2002  Findings: Chronic aortic dissection is again noted.  Aorta now aneurysmal infrarenally measuring up to 3.9 cm compared 2.5 cm previously.  Dissection continues into the left common iliac artery.  Previously, the smaller lumen was felt to represent the false lumen.  If this is the case, the true lumen is occluded in the left common iliac artery with the false lumen remaining open and supplying the left internal iliac artery and external iliac artery.  Left common iliac artery is aneurysmal at 2.3 cm.  Mild aneurysmal dilatation of the right common iliac artery measuring 2.4 cm.  There is free air within the abdomen, anterior to the liver and continuing inferiorly.  Exact cause of free air is not visualized, but may be gastric in origin as the majority of the free fluid is near the stomach.  There is a suggestion of possible wall thickening along the lesser curvature and in the distal stomach. Remainder of the large and small bowel are unremarkable.  Appendix is visualized and is normal.  No free fluid or adenopathy.  Liver, spleen, pancreas, adrenals and kidneys are unremarkable.  Lung bases are clear.  Heart is borderline in size.  IMPRESSION: Moderate pneumoperitoneum.  Question gastric origin.  Exact cause is not visualized but findings are compatible with bowel rupture.  Chronic aortic dissection.  New distal aortic and common iliac artery aneurysms.  The distal aorta  measures maximally 3.9 cm.  The true lumen is occluded in the left common iliac artery.  So that the  These results were discussed with Dr. Roderic Palau at the time of interpretation.  Original Report Authenticated By: Raelyn Number, M.D.   Dg Chest Port 1 View  12/16/2010  *RADIOLOGY REPORT*  Clinical Data: Endotracheal tube.  PORTABLE CHEST - 1 VIEW  Comparison: 12/16/2010  Findings: Endotracheal tube is approximately 5.7 cm above the  carina.  There is persistent volume loss in the left hemithorax probably representing lobar collapse and possibly pleural fluid. Right lung remains clear. Left subclavian central line in the SVC region.  No evidence for pneumothorax.  Nasogastric tube extends into the abdomen.  IMPRESSION: Support apparatuses as described.  Volume loss in the left hemothorax and marked change from 12/15/2010.  Findings probably represent lobar collapse and possibly pleural fluid.  Original Report Authenticated By: Markus Daft, M.D.   Dg Chest Port 1 View  12/16/2010  *RADIOLOGY REPORT*  Clinical Data: Line placement  PORTABLE CHEST - 1 VIEW  Comparison: Plain film 12/16/2010 at 7:31 hours  Findings: Endotracheal tube is unchanged.  There is interval uncoiling of the NG tube which is in the stomach.  A left central venous line has been placed with the distal SVC.  No evidence of left pneumothorax.  There is volume loss in left hemithorax with pleural fluid and air space disease left upper lobe.  IMPRESSION:  1.  Interval placement left central venous line without complication. 2.  Lower lobe atelectasis, effusion, and air space disease in the upper lobe. 3.  Straightening of the NG tube.  Original Report Authenticated By: Suzy Bouchard, M.D.   Dg Chest Port 1 View  12/16/2010  *RADIOLOGY REPORT*  Clinical Data: NG tube  PORTABLE CHEST - 1 VIEW  Comparison: 12/16/2010  Findings: Endotracheal tube 4.8 cm above the carina.  NG tube is coiled old and projects over the mid thoracic area presumably within the mid esophagus region.  Heart remains enlarged with vascular congestion and dense left lower lobe/lingula collapse / consolidation.  Left effusion evident.  No pneumothorax.  IMPRESSION: NG tube coiled over the mid thoracic area presumably within the mid- esophagus.  Recommend complete removal and reattempting placement.  Otherwise stable portable chest exam findings.  Findings called to Truddie Crumble, ICU nurse caring for the patient   Original Report Authenticated By: M. Daryll Brod, M.D.   Portable Chest X-ray 1 View  12/16/2010  *RADIOLOGY REPORT*  Clinical Data: Endotracheal tube placement, status post cardiac catheterization.  PORTABLE CHEST - 1 VIEW  Comparison: Chest radiograph performed 12/15/2010  Findings: The patient's endotracheal tube is seen ending 5-6 cm above the carina.  An enteric tube is seen ending at the mid esophagus.  This should be advanced at least 18 cm.  There is dense retrocardiac airspace opacification, most likely reflecting atelectasis.  Mild right basilar opacity is also seen. Vascular congestion is noted.  No definite pleural effusion or pneumothorax is identified.  The cardiomediastinal silhouette is mildly enlarged.  No acute osseous abnormalities are seen.  IMPRESSION:  1.  Endotracheal tube seen ending 5-6 cm above the carina; this could be advanced 1-2 cm, as deemed clinically appropriate. 2.  Enteric tube noted ending at the mid esophagus; this should be advanced at least 18 cm. 3.  Dense retrocardiac airspace opacification most likely reflects atelectasis; mild right basilar airspace opacity also seen. Vascular congestion noted. 4.  Mild cardiomegaly.  Findings were discussed with Vicente Males  RN on [id number] at 05:49 a.m. on 12/16/2010.  Original Report Authenticated By: Santa Lighter, M.D.   Dg Chest Portable 1 View  12/15/2010  *RADIOLOGY REPORT*  Clinical Data: Chest pain.  PORTABLE CHEST - 1 VIEW  Comparison: Chest CT 12/08/2002.  Findings: The heart is mildly enlarged.  The mediastinal and hilar contours are within normal limits.  The lungs are clear.  No pleural effusion.  The bony thorax is intact. A right humeral neck fracture is noted.  IMPRESSION: No acute cardiopulmonary findings. Right humeral neck fracture.  Original Report Authenticated By: P. Kalman Jewels, M.D.   Dg Humerus Right  12/16/2010  *RADIOLOGY REPORT*  Clinical Data: Right humerus fracture  RIGHT HUMERUS - 2+ VIEW  Comparison:  12/15/2010  Findings: Proximal right humerus surgical neck fracture noted with minimal displacement and probable slight impaction.  No associated subluxation or dislocation at the glenohumeral joint.  AC joint aligned.  Distal humerus at the elbow intact.  IMPRESSION: Acute proximal humerus surgical neck fracture.  Original Report Authenticated By: Jerilynn Mages. Daryll Brod, M.D.    A/P:  No change in clinical or radiographic appearance of the right proximal humerus fracture.  Patient remains in guarded condition in the intensive care unit.  Agree with the sling.  I will inform Dr. Collier Salina the patient was readmitted for an acute GI the bleed. But I do not think there is anything acutely that needs to be done with respect to his proximal humerus fracture.

## 2010-12-17 NOTE — Progress Notes (Signed)
Name: BILLY FORCE MRN: [id number] DOB: 30-Apr-1950    LOS: 2  PCCM ADMISSION NOTE  History of Present Illness: This is a 60 year old male who has recently had a fractured right humeral neck. He has been taking NSAIDs.  He woke up with abdominal pain 11/30.  He presented to the emergency room at outside hospital and appeared to have a perforated viscus by CT scan.  Transfered to the ICU post op on vent.  PCCM is asked to assist with cc management.  Lines / Drains: ETT 12/1 >>> L [place] TLC 12/1>>> L radial a-line 12/1>>> Foley 12/1>>>  Cultures: Blood 12/2>>> Urine 12/2>>> Sputum 12/2>>>  Antibiotics: Unasyn (Per [facility]) 12/1 >>12/1 Zosyn 12/1>>> Diflucan 12/2>>> Vancomycin 12/2>>>  Tests / Events: 12/1 to OR to repair ruptured viscus.  Vital Signs: Temp:  [98.4 F (36.9 C)-101.1 F (38.4 C)] 101.1 F (38.4 C) (12/02 0800) Pulse Rate:  [100-132] 132  (12/02 0800) Resp:  [19-28] 22  (12/02 0800) BP: (94-140)/(39-55) 138/50 mmHg (12/02 0800) SpO2:  [95 %-100 %] 99 % (12/02 0800) Arterial Line BP: (138)/(50) 138/50 mmHg (12/02 0800) FiO2 (%):  [30 %-70 %] 30 % (12/02 0800)  Intake/Output Summary (Last 24 hours) at 12/17/10 1007 Last data filed at 12/17/10 0900  Gross per 24 hour  Intake 1468.39 ml  Output   2425 ml  Net -956.61 ml   Physical Examination: General:  Sedated and intubated obese male. Neuro:  Sedated, intubated, moving all ext spontaneously.   HEENT:  [place]/AT, PERRL, EOM-spontaneous, -LAN and -thyromegally. Cardiovascular:  RRR, Nl S1/S2, -M/R/G. Lungs:  Coarse BS diffusely. Abdomen:  Soft, NT, ND and -BS. Skin:  Spider anbiomas on upper chest.  BMET    Component Value Date/Time   NA 140 12/17/2010 0345   K 4.4 12/17/2010 0345   CL 118* 12/17/2010 0345   CO2 16* 12/17/2010 0345   GLUCOSE 72 12/17/2010 0345   BUN 30* 12/17/2010 0345   CREATININE 1.17 12/17/2010 0345   CALCIUM 6.4* 12/17/2010 0345   GFRNONAA 66* 12/17/2010 0345   GFRAA 76* 12/17/2010 0345    CBC    Component Value Date/Time   WBC 17.2* 12/17/2010 0345   RBC 2.72* 12/17/2010 0345   HGB 8.5* 12/17/2010 0345   HCT 25.8* 12/17/2010 0345   PLT 227 12/17/2010 0345   MCV 94.9 12/17/2010 0345   MCH 31.3 12/17/2010 0345   MCHC 32.9 12/17/2010 0345   RDW 13.9 12/17/2010 0345   LYMPHSABS 0.6* 12/15/2010 1753   MONOABS 0.6 12/15/2010 1753   EOSABS 0.0 12/15/2010 1753   BASOSABS 0.0 12/15/2010 1753   ABG    Component Value Date/Time   PHART 7.386 12/17/2010 0309   PCO2ART 31.1* 12/17/2010 0309   PO2ART 147.0* 12/17/2010 0309   HCO3 18.2* 12/17/2010 0309   TCO2 19.2 12/17/2010 0309   ACIDBASEDEF 5.8* 12/17/2010 0309   O2SAT 98.0 12/17/2010 0309    Ventilator settings: Vent Mode:  [-] PRVC FiO2 (%):  [30 %-70 %] 30 % Set Rate:  [22 bmp-26 bmp] 26 bmp Vt Set:  [500 mL] 500 mL PEEP:  [5 cmH20] 5 cmH20 Plateau Pressure:  [19 cmH20-25 cmH20] 20 cmH20  Labs and Imaging:  Reviewed.  Please refer to the Assessment and Plan section for relevant results.  Assessment and Plan: 1) Vent dep resp failure post laparotomy - now with pulmonary edema as well. Plan: PS trials today.  SBT in AM.  Doubt extubation due to metabolic acidosis.   2) Hypotension: developing  septic shock, on pressors due to perfed GI tract. Plan: Back off pressors as tolerated.  Check cultures (blood, urine and sputum).  Diflucan added.  Add vanc.  F/U on cultures.  Cortisol level of 13 but BP is much more stable today, no    need for stress dose steroids.  Begin TPN.  3) DMII - ICU HG protocol.  4) Tachycardia - Once BP is more stable will need beta blocker.  Seems more stable with sedation.  5) Peritonitis: Change unasyn to zosyn and f/u on cultures.  Add vanc and diflucan  Best practices / Disposition: -->ICU status under [facility], PCCM consulting -->full code -->SCDs for DVT Px -->Protonix for GI Px -->ventilator bundle -->TPN  - Patient was a very difficult airway and is reaching for ETT.  Self  extubation would be disastrous for him.  Will keep hands restrained.  The patient is critically ill with multiple organ systems failure and requires high complexity decision making for assessment and support, frequent evaluation and titration of therapies, application of advanced monitoring technologies and extensive interpretation of multiple databases. Critical Care Time devoted to patient care services described in this note is 35 minutes.  Jennet Maduro, M.D.

## 2010-12-17 NOTE — Progress Notes (Signed)
1 Day Post-Op  Subjective: POD 1 s/p graham patch of perforated du Intubated, sedated  Objective: Vital signs in last 24 hours: Temp:  [98.4 F (36.9 C)-99.3 F (37.4 C)] 99.2 F (37.3 C) (12/02 0400) Pulse Rate:  [100-130] 108  (12/02 0342) Resp:  [17-28] 26  (12/02 0342) BP: (65-140)/(36-55) 113/39 mmHg (12/02 0738) SpO2:  [95 %-100 %] 96 % (12/02 0738) FiO2 (%):  [30 %-70 %] 30 % (12/02 0738)    Intake/Output from previous day: 12/01 0701 - 12/02 0700 In: 2664.2 [I.V.:2539.2; IV Piggyback:75] Out: 2185 [Urine:1885; Emesis/NG output:250; Drains:50] Intake/Output this shift:    Resp: coarse bilaterally Cardio: regular rhythm, tachycardic GI: wound vac in place draining serosang fluid, drains both with nonbilious serosang fluid Extremities: rue in sling, significant hematoma present  Lab Results:   Basename 12/17/10 0345 12/16/10 1800 12/16/10 1134  WBC 17.2* -- 28.1*  HGB 8.5* 9.0* --  HCT 25.8* 27.4* --  PLT 227 -- 243   BMET  Basename 12/17/10 0345 12/16/10 1134  NA 140 139  K 4.4 5.4*  CL 118* 110  CO2 16* 20  GLUCOSE 72 116*  BUN 30* 51*  CREATININE 1.17 1.70*  CALCIUM 6.4* 7.7*   PT/INR  Basename 12/16/10 1134  LABPROT 15.9*  INR 1.24   ABG  Basename 12/17/10 0309 12/16/10 0432  PHART 7.386 7.196*  HCO3 18.2* 19.4*    Studies/Results: Ct Abdomen Pelvis W Contrast  12/15/2010  *RADIOLOGY REPORT*  Clinical Data: Abdominal pain.  CT ABDOMEN AND PELVIS WITH CONTRAST  Technique:  Multidetector CT imaging of the abdomen and pelvis was performed following the standard protocol during bolus administration of intravenous contrast.  Contrast: 94mL OMNIPAQUE IOHEXOL 300 MG/ML IV SOLN  Comparison: 12/08/2002  Findings: Chronic aortic dissection is again noted.  Aorta now aneurysmal infrarenally measuring up to 3.9 cm compared 2.5 cm previously.  Dissection continues into the left common iliac artery.  Previously, the smaller lumen was felt to represent the  false lumen.  If this is the case, the true lumen is occluded in the left common iliac artery with the false lumen remaining open and supplying the left internal iliac artery and external iliac artery.  Left common iliac artery is aneurysmal at 2.3 cm.  Mild aneurysmal dilatation of the right common iliac artery measuring 2.4 cm.  There is free air within the abdomen, anterior to the liver and continuing inferiorly.  Exact cause of free air is not visualized, but may be gastric in origin as the majority of the free fluid is near the stomach.  There is a suggestion of possible wall thickening along the lesser curvature and in the distal stomach. Remainder of the large and small bowel are unremarkable.  Appendix is visualized and is normal.  No free fluid or adenopathy.  Liver, spleen, pancreas, adrenals and kidneys are unremarkable.  Lung bases are clear.  Heart is borderline in size.  IMPRESSION: Moderate pneumoperitoneum.  Question gastric origin.  Exact cause is not visualized but findings are compatible with bowel rupture.  Chronic aortic dissection.  New distal aortic and common iliac artery aneurysms.  The distal aorta measures maximally 3.9 cm.  The true lumen is occluded in the left common iliac artery.  So that the  These results were discussed with Dr. Roderic Palau at the time of interpretation.  Original Report Authenticated By: Raelyn Number, M.D.   Dg Chest Port 1 View  12/16/2010  *RADIOLOGY REPORT*  Clinical Data: Endotracheal tube.  PORTABLE  CHEST - 1 VIEW  Comparison: 12/16/2010  Findings: Endotracheal tube is approximately 5.7 cm above the carina.  There is persistent volume loss in the left hemithorax probably representing lobar collapse and possibly pleural fluid. Right lung remains clear. Left subclavian central line in the SVC region.  No evidence for pneumothorax.  Nasogastric tube extends into the abdomen.  IMPRESSION: Support apparatuses as described.  Volume loss in the left hemothorax and  marked change from 12/15/2010.  Findings probably represent lobar collapse and possibly pleural fluid.  Original Report Authenticated By: Markus Daft, M.D.   Dg Chest Port 1 View  12/16/2010  *RADIOLOGY REPORT*  Clinical Data: Line placement  PORTABLE CHEST - 1 VIEW  Comparison: Plain film 12/16/2010 at 7:31 hours  Findings: Endotracheal tube is unchanged.  There is interval uncoiling of the NG tube which is in the stomach.  A left central venous line has been placed with the distal SVC.  No evidence of left pneumothorax.  There is volume loss in left hemithorax with pleural fluid and air space disease left upper lobe.  IMPRESSION:  1.  Interval placement left central venous line without complication. 2.  Lower lobe atelectasis, effusion, and air space disease in the upper lobe. 3.  Straightening of the NG tube.  Original Report Authenticated By: Suzy Bouchard, M.D.   Dg Chest Port 1 View  12/16/2010  *RADIOLOGY REPORT*  Clinical Data: NG tube  PORTABLE CHEST - 1 VIEW  Comparison: 12/16/2010  Findings: Endotracheal tube 4.8 cm above the carina.  NG tube is coiled old and projects over the mid thoracic area presumably within the mid esophagus region.  Heart remains enlarged with vascular congestion and dense left lower lobe/lingula collapse / consolidation.  Left effusion evident.  No pneumothorax.  IMPRESSION: NG tube coiled over the mid thoracic area presumably within the mid- esophagus.  Recommend complete removal and reattempting placement.  Otherwise stable portable chest exam findings.  Findings called to Truddie Crumble, ICU nurse caring for the patient  Original Report Authenticated By: M. Daryll Brod, M.D.   Portable Chest X-ray 1 View  12/16/2010  *RADIOLOGY REPORT*  Clinical Data: Endotracheal tube placement, status post cardiac catheterization.  PORTABLE CHEST - 1 VIEW  Comparison: Chest radiograph performed 12/15/2010  Findings: The patient's endotracheal tube is seen ending 5-6 cm above the carina.  An  enteric tube is seen ending at the mid esophagus.  This should be advanced at least 18 cm.  There is dense retrocardiac airspace opacification, most likely reflecting atelectasis.  Mild right basilar opacity is also seen. Vascular congestion is noted.  No definite pleural effusion or pneumothorax is identified.  The cardiomediastinal silhouette is mildly enlarged.  No acute osseous abnormalities are seen.  IMPRESSION:  1.  Endotracheal tube seen ending 5-6 cm above the carina; this could be advanced 1-2 cm, as deemed clinically appropriate. 2.  Enteric tube noted ending at the mid esophagus; this should be advanced at least 18 cm. 3.  Dense retrocardiac airspace opacification most likely reflects atelectasis; mild right basilar airspace opacity also seen. Vascular congestion noted. 4.  Mild cardiomegaly.  Findings were discussed with Sherlene Shams on [phone] at 05:49 a.m. on 12/16/2010.  Original Report Authenticated By: Santa Lighter, M.D.   Dg Chest Portable 1 View  12/15/2010  *RADIOLOGY REPORT*  Clinical Data: Chest pain.  PORTABLE CHEST - 1 VIEW  Comparison: Chest CT 12/08/2002.  Findings: The heart is mildly enlarged.  The mediastinal and hilar contours are within normal limits.  The lungs are clear.  No pleural effusion.  The bony thorax is intact. A right humeral neck fracture is noted.  IMPRESSION: No acute cardiopulmonary findings. Right humeral neck fracture.  Original Report Authenticated By: P. Kalman Jewels, M.D.   Dg Humerus Right  12/16/2010  *RADIOLOGY REPORT*  Clinical Data: Right humerus fracture  RIGHT HUMERUS - 2+ VIEW  Comparison: 12/15/2010  Findings: Proximal right humerus surgical neck fracture noted with minimal displacement and probable slight impaction.  No associated subluxation or dislocation at the glenohumeral joint.  AC joint aligned.  Distal humerus at the elbow intact.  IMPRESSION: Acute proximal humerus surgical neck fracture.  Original Report Authenticated By: Jerilynn Mages. Daryll Brod,  M.D.    Anti-infectives: Anti-infectives     Start     Dose/Rate Route Frequency Ordered Stop   12/17/10 0800   fluconazole (DIFLUCAN) IVPB 100 mg        100 mg 50 mL/hr over 60 Minutes Intravenous Every 24 hours 12/16/10 0334     12/16/10 0600  piperacillin-tazobactam (ZOSYN) IVPB 3.375 g       3.375 g 12.5 mL/hr over 240 Minutes Intravenous 3 times per day 12/16/10 0320     12/16/10 0500   fluconazole (DIFLUCAN) IVPB 200 mg        200 mg 100 mL/hr over 60 Minutes Intravenous  Once 12/16/10 0334 12/16/10 [id number]   12/15/10 2030   ampicillin-sulbactam (UNASYN) 1.5 g in sodium chloride 0.9 % 50 mL IVPB        1.5 g 100 mL/hr over 30 Minutes Intravenous  Once 12/15/10 2027 12/15/10 2127          Assessment/Plan: s/p Procedure(s): EXPLORATORY LAPAROTOMY appreciate ccm consult mech vent per ccm Tachycardia likely related to volume/sirs response to significant abdominal contamination Continue ng tube, drains, npo Start tna today Cont zosyn/diflucan, follow wbc, temp curve Ortho for humerus fracture  LOS: 2 days    [facility] 12/17/2010

## 2010-12-17 NOTE — Progress Notes (Signed)
ANTIBIOTIC CONSULT NOTE - Follow-Up  Pharmacy Consult for Zosyn/Fluconazole - Adding Vancomycin today Indication: Perforated ulcer repair  No Known Allergies  Patient Measurements: Height: 5\' 5"  (165.1 cm) Weight: 218 lb 14.7 oz (99.3 kg) IBW/kg (Calculated) : 61.5    Vital Signs: Temp: 101.1 F (38.4 C) (12/02 0800) Temp src: Axillary (12/02 0800) BP: 156/47 mmHg (12/02 1114) Pulse Rate: 134  (12/02 1200) Intake/Output from previous day: 12/01 0701 - 12/02 0700 In: 2664.2 [I.V.:2539.2; IV Piggyback:75] Out: 2185 [Urine:1885; Emesis/NG output:250; Drains:50] Intake/Output from this shift: Total I/O In: -  Out: 525 [Urine:525]  Labs:  [facility] 12/17/10 0345 12/16/10 1800 12/16/10 1134 12/16/10 0700  WBC 17.2* -- 28.1* 33.0*  HGB 8.5* 9.0* 9.1* --  PLT 227 -- 243 260  LABCREA -- -- -- --  CREATININE 1.17 -- 1.70* 1.78*   Estimated Creatinine Clearance: 71.8 ml/min (by C-G formula based on Cr of 1.17). No results found for this basename: VANCOTROUGH:2,VANCOPEAK:2,VANCORANDOM:2,GENTTROUGH:2,GENTPEAK:2,GENTRANDOM:2,TOBRATROUGH:2,TOBRAPEAK:2,TOBRARND:2,AMIKACINPEAK:2,AMIKACINTROU:2,AMIKACIN:2, in the last 72 hours   Microbiology: Recent Results (from the past 720 hour(s))  MRSA PCR SCREENING     Status: Normal   Collection Time   12/16/10  3:09 AM      Component Value Range Status Comment   MRSA by PCR NEGATIVE  NEGATIVE  Final     Medical History: Past Medical History  Diagnosis Date  . Diabetes mellitus   . Hypertension      Assessment: 60 yo male s/p repair of perforated duodenal ulcer for empiric antibiotics, now with worsening sepsis looking picture and plans to broaden antibiotic coverage.  Temperature to 101, pressors added, remains ventilated and agitated.  Renal function has improved drastically with creatinine now at 1.1 and an estimated clearance of ~ 57ml/min.    Plan:  1.  Continue Zosyn 3.375 g IV q8h 2.  Continue Fluconazole 100 mg IV daily 3.   Add Vancomycin 1.25 grams every 12 hours. 4.  Will f/u s/s levels and adjust dose as needed. 5.  Monitor renal function and adjust if needed.  Rober Minion, PharmD., MS 12/17/2010,12:23 PM

## 2010-12-18 ENCOUNTER — Inpatient Hospital Stay (HOSPITAL_COMMUNITY): Payer: BC Managed Care – PPO

## 2010-12-18 DIAGNOSIS — E46 Unspecified protein-calorie malnutrition: Secondary | ICD-10-CM

## 2010-12-18 LAB — GLUCOSE, CAPILLARY
Glucose-Capillary: 123 mg/dL — ABNORMAL HIGH (ref 70–99)
Glucose-Capillary: 142 mg/dL — ABNORMAL HIGH (ref 70–99)
Glucose-Capillary: 138 mg/dL — ABNORMAL HIGH (ref 70–99)
Glucose-Capillary: 127 mg/dL — ABNORMAL HIGH (ref 70–99)

## 2010-12-18 LAB — CBC
HCT: 27.4 % — ABNORMAL LOW (ref 39.0–52.0)
MCH: 30.1 pg (ref 26.0–34.0)
MCHC: 31.4 g/dL (ref 30.0–36.0)
MCV: 95.8 fL (ref 78.0–100.0)
RDW: 14.3 % (ref 11.5–15.5)

## 2010-12-18 LAB — COMPREHENSIVE METABOLIC PANEL
AST: 15 U/L (ref 0–37)
Alkaline Phosphatase: 76 U/L (ref 39–117)
CO2: 20 mEq/L (ref 19–32)
Chloride: 116 mEq/L — ABNORMAL HIGH (ref 96–112)
Creatinine, Ser: 1.29 mg/dL (ref 0.50–1.35)
GFR calc non Af Amer: 58 mL/min — ABNORMAL LOW (ref >90)
Total Bilirubin: 2.5 mg/dL — ABNORMAL HIGH (ref 0.3–1.2)

## 2010-12-18 LAB — BLOOD GAS, ARTERIAL
Acid-base deficit: 7 mmol/L — ABNORMAL HIGH (ref 0.0–2.0)
TCO2: 18.9 mmol/L (ref 0–100)
pCO2 arterial: 35 mmHg (ref 35.0–45.0)
pH, Arterial: 7.327 — ABNORMAL LOW (ref 7.350–7.450)
pO2, Arterial: 124 mmHg — ABNORMAL HIGH (ref 80.0–100.0)

## 2010-12-18 LAB — DIFFERENTIAL
Basophils Absolute: 0 10*3/uL (ref 0.0–0.1)
Basophils Relative: 0 % (ref 0–1)
Eosinophils Absolute: 0.1 10*3/uL (ref 0.0–0.7)
Eosinophils Relative: 1 % (ref 0–5)
Monocytes Absolute: 0.7 10*3/uL (ref 0.1–1.0)

## 2010-12-18 LAB — PHOSPHORUS: Phosphorus: 2.9 mg/dL (ref 2.3–4.6)

## 2010-12-18 LAB — URINE CULTURE: Culture  Setup Time: 201212021805

## 2010-12-18 LAB — TRIGLYCERIDES: Triglycerides: 121 mg/dL (ref <150)

## 2010-12-18 MED ORDER — TRACE MINERALS CR-CU-MN-SE-ZN 10-1000-500-60 MCG/ML IV SOLN
INTRAVENOUS | Status: AC
  Administered 2010-12-18: 18:00:00 via INTRAVENOUS
  Filled 2010-12-18: qty 1000

## 2010-12-18 MED ORDER — FAT EMULSION 20 % IV EMUL
250.0000 mL | INTRAVENOUS | Status: AC
  Administered 2010-12-18: 250 mL via INTRAVENOUS
  Filled 2010-12-18: qty 250

## 2010-12-18 MED ORDER — METOPROLOL TARTRATE 1 MG/ML IV SOLN
5.0000 mg | Freq: Four times a day (QID) | INTRAVENOUS | Status: DC
  Administered 2010-12-18 – 2010-12-19 (×4): 5 mg via INTRAVENOUS
  Filled 2010-12-18 (×8): qty 5

## 2010-12-18 NOTE — Progress Notes (Signed)
PARENTERAL NUTRITION CONSULT NOTE - FOLLOW UP  Pharmacy Consult for TPN Indication: perforated DU s/p patch repair  No Known Allergies  Patient Measurements: Height: 5\' 5"  (165.1 cm) Weight: 222 lb 7.1 oz (100.9 kg) IBW/kg (Calculated) : 61.5   Vital Signs: Temp: 102.3 F (39.1 C) (12/03 0740) Temp src: Axillary (12/03 0740) BP: 141/52 mmHg (12/03 0312) Pulse Rate: 155  (12/03 0700) Intake/Output from previous day: 12/02 0701 - 12/03 0700 In: 937.9 [I.V.:477.9; IV Piggyback:460] Out: [id number] [Urine:4275; Emesis/NG output:200; Drains:43] Intake/Output from this shift:    Labs:  [facility] 12/18/10 0300 12/17/10 0345 12/16/10 1800 12/16/10 1134  WBC 14.2* 17.2* -- 28.1*  HGB 8.6* 8.5* 9.0* --  HCT 27.4* 25.8* 27.4* --  PLT 254 227 -- 243  APTT -- -- -- --  INR -- -- -- 1.24     Basename 12/18/10 0300 12/17/10 0345 12/16/10 1134 12/16/10 0700  NA 144 140 139 --  K 4.6 4.4 5.4* --  CL 116* 118* 110 --  CO2 20 16* 20 --  GLUCOSE 133* 72 116* --  BUN 22 30* 51* --  CREATININE 1.29 1.17 1.70* --  LABCREA -- -- -- --  CREAT24HRUR -- -- -- --  CALCIUM 8.8 6.4* 7.7* --  MG 2.5 1.5 1.6 --  PHOS 2.9 2.8 4.1 --  PROT 5.1* 4.1* -- 5.3*  ALBUMIN 1.9* 1.7* -- 2.3*  AST 15 25 -- 14  ALT 14 12 -- 13  ALKPHOS 76 68 -- 79  BILITOT 2.5* 0.9 -- 0.4  BILIDIR -- -- -- --  IBILI -- -- -- --  PREALBUMIN -- -- -- --  CHOLHDL -- -- -- --  CHOL 84 -- -- --   Estimated Creatinine Clearance: 65.7 ml/min (by C-G formula based on Cr of 1.29).    Basename 12/18/10 0423 12/17/10 2350 12/17/10 2024  GLUCAP 123* 120* 120*    Medications:  Scheduled:     . antiseptic oral rinse  15 mL Mouth Rinse QID  . chlorhexidine  15 mL Mouth Rinse BID  . fluconazole (DIFLUCAN) IV  100 mg Intravenous Q24H  . heparin  5,000 Units Subcutaneous Q8H  . insulin aspart  0-15 Units Subcutaneous Q4H  . ipratropium  0.5 mg Nebulization Q6H  . levalbuterol  0.63 mg Nebulization Q6H  . magnesium sulfate  IVPB  2 g Intravenous Once  . pantoprazole (PROTONIX) IV  40 mg Intravenous Q12H  . piperacillin-tazobactam (ZOSYN)  IV  3.375 g Intravenous Q8H  . sodium chloride  1,000 mL Intravenous Once  . vancomycin  1,250 mg Intravenous Q12H  . DISCONTD: magnesium  2 g Intravenous Once  . DISCONTD: magnesium sulfate IVPB  2 g Intravenous Once   Infusions:     . sodium chloride 125 mL/hr at 12/18/10 0600  . sodium chloride 10 mL/hr at 12/18/10 0600  . fentaNYL infusion INTRAVENOUS 300 mcg/hr (12/18/10 0600)  . midazolam (VERSED) infusion 4 mg/hr (12/18/10 0600)  . TPN (CLINIMIX) +/- additives 40 mL/hr at 12/18/10 0600    Insulin Requirements in the past 24 hours:  2 units SSI  Current Nutrition:  NPO  Assessment: 1 YOM s/p Ex-lap and patch repair of perforated DU on 12/1. Appears was taking NSAIDs for pain control following LE fracture.  He is currently intubated and sedated.   F: NS 61ml/hr E: K 4.6   tbili 2.5       Mg=2.5, Phos = 2.9 Nutritional Goals:  Est needs: 90-100gm protein, 1600-1900 Kcals, await for  RD evaluation for more definitive goals.  Plan:  1. Continue TPN at ~50% est goals.     2. Due to national backorder, lipids, MVI, and trace elements to be given MWF.  3. Follow CBGs 4.  f/u labs in am   Kelsye Loomer Poteet 12/18/2010,8:36 AM

## 2010-12-18 NOTE — Progress Notes (Signed)
Name: Harry James MRN: [id number] DOB: 12-18-1950    LOS: 3  PCCM ADMISSION NOTE  History of Present Illness: This is a 60 year old male who has recently had a fractured right humeral neck. He has been taking NSAIDs.  He woke up with abdominal pain 11/30.  He presented to the emergency room at outside hospital and appeared to have a perforated viscus by CT scan.  Transfered to the ICU post op on vent.  PCCM is asked to assist with cc management.  Lines / Drains: ETT 12/1 >>> L [place] TLC 12/1>>> L radial a-line 12/1>>>12/3 Foley 12/1>>>12/3  Cultures: Blood 12/2>>> Urine 12/2>>> Sputum 12/2>>>  Antibiotics: Unasyn (Per CCS) 12/1 >>12/1 Zosyn 12/1>>> Diflucan 12/2>>> Vancomycin 12/2>>>  Tests / Events: 12/1 to OR to repair ruptured viscus.  Vital Signs: Temp:  [99.9 F (37.7 C)-102.3 F (39.1 C)] 102.3 F (39.1 C) (12/03 0740) Pulse Rate:  [108-155] 124  (12/03 1000) Resp:  [16-23] 20  (12/03 1000) BP: (102-156)/(43-54) 141/52 mmHg (12/03 0312) SpO2:  [87 %-100 %] 96 % (12/03 1000) FiO2 (%):  [30 %-50 %] 40 % (12/03 1000) Weight:  [100.9 kg (222 lb 7.1 oz)] 222 lb 7.1 oz (100.9 kg) (12/03 0400)  Intake/Output Summary (Last 24 hours) at 12/18/10 1041 Last data filed at 12/18/10 1000  Gross per 24 hour  Intake 1359.87 ml  Output   4868 ml  Net -3508.13 ml   Physical Examination: General:  Sedated and intubated male, sporadically follows commands. Neuro:  Sedated, intubated, moving all ext spontaneously.  But follows commands occasionally. HEENT:  [place]/AT, PERRL, EOM-spontaneous, -LAN and -thyromegally. Cardiovascular:  RRR, Nl S1/S2, -M/R/G. Lungs:  Coarse BS diffusely. Abdomen:  Soft, NT, ND and -BS. Skin:  Spider angiomas on upper chest.  BMET    Component Value Date/Time   NA 144 12/18/2010 0300   K 4.6 12/18/2010 0300   CL 116* 12/18/2010 0300   CO2 20 12/18/2010 0300   GLUCOSE 133* 12/18/2010 0300   BUN 22 12/18/2010 0300   CREATININE 1.29 12/18/2010 0300   CALCIUM 8.8 12/18/2010 0300   GFRNONAA 58* 12/18/2010 0300   GFRAA 68* 12/18/2010 0300   CBC    Component Value Date/Time   WBC 14.2* 12/18/2010 0300   RBC 2.86* 12/18/2010 0300   HGB 8.6* 12/18/2010 0300   HCT 27.4* 12/18/2010 0300   PLT 254 12/18/2010 0300   MCV 95.8 12/18/2010 0300   MCH 30.1 12/18/2010 0300   MCHC 31.4 12/18/2010 0300   RDW 14.3 12/18/2010 0300   LYMPHSABS 0.8 12/18/2010 0300   MONOABS 0.7 12/18/2010 0300   EOSABS 0.1 12/18/2010 0300   BASOSABS 0.0 12/18/2010 0300   ABG    Component Value Date/Time   PHART 7.327* 12/18/2010 0500   PCO2ART 35.0 12/18/2010 0500   PO2ART 124.0* 12/18/2010 0500   HCO3 17.8* 12/18/2010 0500   TCO2 18.9 12/18/2010 0500   ACIDBASEDEF 7.0* 12/18/2010 0500   O2SAT 98.8 12/18/2010 0500    Ventilator settings: Vent Mode:  [-] PRVC FiO2 (%):  [30 %-50 %] 40 % Set Rate:  [22 bmp] 22 bmp Vt Set:  [500 mL] 500 mL PEEP:  [5 cmH20] 5 cmH20 Plateau Pressure:  [12 S192499 cmH20] 21 cmH20  Imaging:  Reviewed.  Please refer to the Assessment and Plan section for relevant results.  Assessment and Plan: 1) Vent dep resp failure post laparotomy - now with pulmonary edema as well. Plan: PS trials today.  SBT in AM.   2)  Hypotension: developing septic shock, on pressors due to perfed GI tract. Plan: D/C pressors.  Cultures negative.  D/C IVF.  Diflucan added.  Vanc.  Cortisol level of 13 but BP is much more stable today, no need for stress dose steroids.  Continue TPN.  3) DMII - ICU HG protocol.  4) Tachycardia - Scheduled beta blockers today.  5) Peritonitis: Changed unasyn to zosyn/vanc/diflucan and f/u on cultures.  Best practices / Disposition: -->ICU status under CCS, PCCM consulting -->full code -->SCDs for DVT Px -->Protonix for GI Px -->ventilator bundle -->TPN  - Patient was a very difficult airway and is reaching for ETT.  Self extubation would be disastrous for him.  Will keep hands restrained.  The patient is critically ill  with multiple organ systems failure and requires high complexity decision making for assessment and support, frequent evaluation and titration of therapies, application of advanced monitoring technologies and extensive interpretation of multiple databases. Critical Care Time devoted to patient care services described in this note is 35 minutes.  Jennet Maduro, M.D.

## 2010-12-18 NOTE — Consult Note (Signed)
WOC consult Note Reason for Consult: req. To see for VAC dressing changes.  Pt with midline abdominal open wound with VAC in place.    Wound type: surgical wound  Measurement:  16cm x 3 cm x 1 cm   Wound bed: pink with adipose tissue, 1 noted exposed suture in wound bed  Drainage (amount, consistency, odor) serosanguinous  Periwound: intact without problems   Dressing procedure/placement/frequency:  NPWT VAC dressing changed, 1pc black granufoam placed back in wound bed, NPWT seal at 139mmHG obtained.  Pt tolerated dressing with problems.  Bedside RN observed dressing change. Supplies ordered to bedside for next dressing change   Thanks  [place], [place] [phone])

## 2010-12-18 NOTE — Progress Notes (Signed)
INITIAL ADULT NUTRITION ASSESSMENT Date: 12/18/2010   Time: 8:56 AM  Reason for Assessment: new TPN  ASSESSMENT: Male 60 y.o.  Dx: peritonitis with ? ulcer  Hx:  Past Medical History  Diagnosis Date  . Diabetes mellitus   . Hypertension    Related Meds:     . antiseptic oral rinse  15 mL Mouth Rinse QID  . chlorhexidine  15 mL Mouth Rinse BID  . fluconazole (DIFLUCAN) IV  100 mg Intravenous Q24H  . heparin  5,000 Units Subcutaneous Q8H  . insulin aspart  0-15 Units Subcutaneous Q4H  . ipratropium  0.5 mg Nebulization Q6H  . levalbuterol  0.63 mg Nebulization Q6H  . magnesium sulfate IVPB  2 g Intravenous Once  . pantoprazole (PROTONIX) IV  40 mg Intravenous Q12H  . piperacillin-tazobactam (ZOSYN)  IV  3.375 g Intravenous Q8H  . sodium chloride  1,000 mL Intravenous Once  . vancomycin  1,250 mg Intravenous Q12H  . DISCONTD: magnesium  2 g Intravenous Once  . DISCONTD: magnesium sulfate IVPB  2 g Intravenous Once    Ht: 5\' 5"  (165.1 cm)  Wt: 222 lb 7.1 oz (100.9 kg)  Ideal Wt: 75 kg % Ideal Wt: 135%  Usual Wt: unable to obtain at this time % Usual Wt: n/a  Body mass index is 37.02 kg/(m^2). Pt is obese  Food/Nutrition Related Hx: unable to obtain at this time  Labs:  CMP     Component Value Date/Time   NA 144 12/18/2010 0300   K 4.6 12/18/2010 0300   CL 116* 12/18/2010 0300   CO2 20 12/18/2010 0300   GLUCOSE 133* 12/18/2010 0300   BUN 22 12/18/2010 0300   CREATININE 1.29 12/18/2010 0300   CALCIUM 8.8 12/18/2010 0300   PROT 5.1* 12/18/2010 0300   ALBUMIN 1.9* 12/18/2010 0300   AST 15 12/18/2010 0300   ALT 14 12/18/2010 0300   ALKPHOS 76 12/18/2010 0300   BILITOT 2.5* 12/18/2010 0300   GFRNONAA 58* 12/18/2010 0300   GFRAA 68* 12/18/2010 0300   Intake/Output I/O last 3 completed shifts: In: 1485.4 [I.V.:1012.9; IV Piggyback:472.5] Out: [id number]; Emesis/NG output:200; Drains:63]    Diet Order: NPO  Supplements/Tube Feeding: none at this time,  TPN  IVF:    sodium chloride Last Rate: 125 mL/hr at 12/18/10 0600  sodium chloride Last Rate: 10 mL/hr at 12/18/10 0600  TPN (CLINIMIX) +/- additives   And   fat emulsion   fentaNYL infusion INTRAVENOUS Last Rate: 300 mcg/hr (12/18/10 0815)  midazolam (VERSED) infusion Last Rate: 4 mg/hr (12/18/10 0600)  TPN (CLINIMIX) +/- additives Last Rate: 40 mL/hr at 12/18/10 0600   Estimated Nutritional Needs:   Kcal: 2213 kcal   Permissive underfeeding kcal goal per ASPEN guidelines (60-70%): 1350 - 1550 kcal Protein: 115 -130 g Fluid: 2 - 2.2 L/d  Abdominal pain x 24 hours. Exp lap with [name] patch of perforated duodenal ulcer performed 12/1. TNA started 12/2 2/2 perforated DU s/p patch repair. Currently intubated and sedated. Patient is receiving TPN with Clinimix E 5/15 @ 40 ml/hr.  Lipids (20% IVFE @ 5 ml/hr), multivitamins, and trace elements are provided 3 times weekly (MWF) due to national backorder.  Provides 785 kcal and 48 grams protein daily (based on weekly average).  Meets 58% minimum permissive underfeeding estimated kcal and 42% minimum estimated protein needs.  Additional IVF with NS @ 125 ml/hr.  NUTRITION DIAGNOSIS: -Inadequate oral intake (NI-2.1).  Status: Ongoing  RELATED TO: inability to eat  AS  EVIDENCE BY: NPO status  MONITORING/EVALUATION(Goals): Goal: TPN to meet 100% protein needs, maximize energy provision as able during national lipid backorder. Monitor: TPN adequacy, weights, labs  EDUCATION NEEDS: -No education needs identified at this time  INTERVENTION: 1. TPN per pharmacy 2. RD to follow nutrition care plan  Dietitian #: [phone]  [place] Per approved criteria  -Obesity Unspecified    [place], [place] 12/18/2010, 8:56 AM

## 2010-12-18 NOTE — Progress Notes (Signed)
2 Days Post-Op  Subjective: On vent  Objective: Vital signs in last 24 hours: Temp:  [99.9 F (37.7 C)-102.3 F (39.1 C)] 102.3 F (39.1 C) (12/03 0740) Pulse Rate:  [108-155] 155  (12/03 0700) Resp:  [16-22] 22  (12/03 0700) BP: (102-156)/(43-54) 141/52 mmHg (12/03 0312) SpO2:  [87 %-100 %] 93 % (12/03 0700) FiO2 (%):  [30 %-50 %] 40 % (12/03 0740) Weight:  [100.9 kg (222 lb 7.1 oz)] 222 lb 7.1 oz (100.9 kg) (12/03 0400) Last BM Date:  (PTA)  Intake/Output from previous day: 12/02 0701 - 12/03 0700 In: 937.9 [I.V.:477.9; IV Piggyback:460] Out: [id number] [Urine:4275; Emesis/NG output:200; Drains:43] Intake/Output this shift:    On vent Lungs few rhonchi CV tachy ABD soft, skin VAC on midline wound, quiet, drains with serosanguinous output  Lab Results:   Basename 12/18/10 0300 12/17/10 0345  WBC 14.2* 17.2*  HGB 8.6* 8.5*  HCT 27.4* 25.8*  PLT 254 227   BMET  Basename 12/18/10 0300 12/17/10 0345  NA 144 140  K 4.6 4.4  CL 116* 118*  CO2 20 16*  GLUCOSE 133* 72  BUN 22 30*  CREATININE 1.29 1.17  CALCIUM 8.8 6.4*   PT/INR  Basename 12/16/10 1134  LABPROT 15.9*  INR 1.24   ABG  Basename 12/18/10 0500 12/17/10 0309  PHART 7.327* 7.386  HCO3 17.8* 18.2*    Studies/Results: Dg Chest Port 1 View  12/18/2010  *RADIOLOGY REPORT*  Clinical Data: Endotracheal tube placement  PORTABLE CHEST - 1 VIEW  Comparison: 12/ 01/12  Findings: Cardiomediastinal silhouette is stable.  Endotracheal tube with tip 4 cm above the carina.  No convincing pulmonary edema. No diagnostic pneumothorax.  NG tube unchanged in position. Left subclavian central line with tip in SVC stable in position. Persistent left pleural effusion with left lower lobe atelectasis or infiltrate.  Mild right basilar atelectasis.  IMPRESSION:  Endotracheal tube with tip 4 cm above the carina.  No convincing pulmonary edema. No diagnostic pneumothorax.  NG tube unchanged in position.  Left subclavian central  line with tip in SVC stable in position.  Persistent left pleural effusion with left lower lobe atelectasis or infiltrate.  Mild right basilar atelectasis.  Original Report Authenticated By: Lahoma Crocker, M.D.   Dg Chest Port 1 View  12/16/2010  *RADIOLOGY REPORT*  Clinical Data: Endotracheal tube.  PORTABLE CHEST - 1 VIEW  Comparison: 12/16/2010  Findings: Endotracheal tube is approximately 5.7 cm above the carina.  There is persistent volume loss in the left hemithorax probably representing lobar collapse and possibly pleural fluid. Right lung remains clear. Left subclavian central line in the SVC region.  No evidence for pneumothorax.  Nasogastric tube extends into the abdomen.  IMPRESSION: Support apparatuses as described.  Volume loss in the left hemothorax and marked change from 12/15/2010.  Findings probably represent lobar collapse and possibly pleural fluid.  Original Report Authenticated By: Markus Daft, M.D.   Dg Chest Port 1 View  12/16/2010  *RADIOLOGY REPORT*  Clinical Data: Line placement  PORTABLE CHEST - 1 VIEW  Comparison: Plain film 12/16/2010 at 7:31 hours  Findings: Endotracheal tube is unchanged.  There is interval uncoiling of the NG tube which is in the stomach.  A left central venous line has been placed with the distal SVC.  No evidence of left pneumothorax.  There is volume loss in left hemithorax with pleural fluid and air space disease left upper lobe.  IMPRESSION:  1.  Interval placement left central venous line without  complication. 2.  Lower lobe atelectasis, effusion, and air space disease in the upper lobe. 3.  Straightening of the NG tube.  Original Report Authenticated By: Suzy Bouchard, M.D.    Anti-infectives: Anti-infectives     Start     Dose/Rate Route Frequency Ordered Stop   12/17/10 1400   vancomycin (VANCOCIN) 1,250 mg in sodium chloride 0.9 % 250 mL IVPB        1,250 mg 166.7 mL/hr over 90 Minutes Intravenous Every 12 hours 12/17/10 1223     12/17/10 0800    fluconazole (DIFLUCAN) IVPB 100 mg        100 mg 50 mL/hr over 60 Minutes Intravenous Every 24 hours 12/16/10 0334     12/16/10 0600   piperacillin-tazobactam (ZOSYN) IVPB 3.375 g        3.375 g 12.5 mL/hr over 240 Minutes Intravenous 3 times per day 12/16/10 0320     12/16/10 0500   fluconazole (DIFLUCAN) IVPB 200 mg        200 mg 100 mL/hr over 60 Minutes Intravenous  Once 12/16/10 0334 12/16/10 [id number]   12/15/10 2030   ampicillin-sulbactam (UNASYN) 1.5 g in sodium chloride 0.9 % 50 mL IVPB        1.5 g 100 mL/hr over 30 Minutes Intravenous  Once 12/15/10 2027 12/15/10 2127          Assessment/Plan: s/p Procedure(s): EXPLORATORY LAPAROTOMY S/P Patch repair perf DU POD #2 VDRF and sepsis per CCM - appreciate their assist ID Vanc/Zosyn/Diflucan FEN TNA for now WOC RN for [facility] changes R Prox humerus FX sling per ortho VTE heparin   LOS: 3 days    Lyn Joens E 12/18/2010

## 2010-12-19 ENCOUNTER — Encounter (HOSPITAL_COMMUNITY): Payer: Self-pay | Admitting: General Surgery

## 2010-12-19 ENCOUNTER — Inpatient Hospital Stay (HOSPITAL_COMMUNITY): Payer: BC Managed Care – PPO

## 2010-12-19 DIAGNOSIS — A419 Sepsis, unspecified organism: Secondary | ICD-10-CM

## 2010-12-19 DIAGNOSIS — R6521 Severe sepsis with septic shock: Secondary | ICD-10-CM

## 2010-12-19 DIAGNOSIS — J9600 Acute respiratory failure, unspecified whether with hypoxia or hypercapnia: Secondary | ICD-10-CM

## 2010-12-19 DIAGNOSIS — R0902 Hypoxemia: Secondary | ICD-10-CM

## 2010-12-19 DIAGNOSIS — K659 Peritonitis, unspecified: Secondary | ICD-10-CM

## 2010-12-19 LAB — GLUCOSE, CAPILLARY: Glucose-Capillary: 148 mg/dL — ABNORMAL HIGH (ref 70–99)

## 2010-12-19 LAB — CBC
Hemoglobin: 8.2 g/dL — ABNORMAL LOW (ref 13.0–17.0)
MCHC: 31.1 g/dL (ref 30.0–36.0)
RDW: 14.4 % (ref 11.5–15.5)
WBC: 11.8 10*3/uL — ABNORMAL HIGH (ref 4.0–10.5)

## 2010-12-19 LAB — BLOOD GAS, ARTERIAL
Bicarbonate: 22.9 mEq/L (ref 20.0–24.0)
TCO2: 24.4 mmol/L (ref 0–100)
pCO2 arterial: 48.1 mmHg — ABNORMAL HIGH (ref 35.0–45.0)
pH, Arterial: 7.3 — ABNORMAL LOW (ref 7.350–7.450)

## 2010-12-19 LAB — COMPREHENSIVE METABOLIC PANEL
ALT: 17 U/L (ref 0–53)
AST: 16 U/L (ref 0–37)
CO2: 23 mEq/L (ref 19–32)
Chloride: 118 mEq/L — ABNORMAL HIGH (ref 96–112)
GFR calc non Af Amer: 56 mL/min — ABNORMAL LOW (ref >90)
Potassium: 4.3 mEq/L (ref 3.5–5.1)
Sodium: 149 mEq/L — ABNORMAL HIGH (ref 135–145)
Total Bilirubin: 2.2 mg/dL — ABNORMAL HIGH (ref 0.3–1.2)

## 2010-12-19 LAB — PHOSPHORUS: Phosphorus: 2.7 mg/dL (ref 2.3–4.6)

## 2010-12-19 LAB — MAGNESIUM: Magnesium: 2.2 mg/dL (ref 1.5–2.5)

## 2010-12-19 MED ORDER — METOPROLOL TARTRATE 1 MG/ML IV SOLN
10.0000 mg | Freq: Four times a day (QID) | INTRAVENOUS | Status: DC
  Administered 2010-12-19 – 2010-12-26 (×23): 10 mg via INTRAVENOUS
  Filled 2010-12-19 (×30): qty 10

## 2010-12-19 MED ORDER — SODIUM CHLORIDE 0.9 % IJ SOLN
10.0000 mL | Freq: Two times a day (BID) | INTRAMUSCULAR | Status: DC
  Administered 2010-12-21 – 2010-12-27 (×6): 10 mL

## 2010-12-19 MED ORDER — DEXTROSE 5 % IV SOLN
INTRAVENOUS | Status: DC
  Administered 2010-12-21: via INTRAVENOUS

## 2010-12-19 MED ORDER — SODIUM CHLORIDE 0.45 % IV SOLN: INTRAVENOUS | Status: DC

## 2010-12-19 MED ORDER — DEXTROSE 5 % IV SOLN
INTRAVENOUS | Status: DC
  Administered 2010-12-19: 10 mL via INTRAVENOUS
  Administered 2010-12-20: 22:00:00 via INTRAVENOUS

## 2010-12-19 MED ORDER — DEXMEDETOMIDINE HCL 100 MCG/ML IV SOLN
0.0000 ug/kg/h | INTRAVENOUS | Status: DC
  Administered 2010-12-19 – 2010-12-21 (×5): 0.5 ug/kg/h via INTRAVENOUS
  Filled 2010-12-19 (×7): qty 4

## 2010-12-19 MED ORDER — FENTANYL CITRATE 0.05 MG/ML IJ SOLN
25.0000 ug | INTRAMUSCULAR | Status: DC | PRN
  Administered 2010-12-19 – 2010-12-22 (×10): 50 ug via INTRAVENOUS
  Filled 2010-12-19 (×12): qty 2

## 2010-12-19 MED ORDER — SODIUM CHLORIDE 0.9 % IV SOLN
750.0000 mg | Freq: Two times a day (BID) | INTRAVENOUS | Status: DC
  Administered 2010-12-20 – 2010-12-21 (×4): 750 mg via INTRAVENOUS
  Filled 2010-12-19 (×5): qty 750

## 2010-12-19 MED ORDER — SODIUM CHLORIDE 0.9 % IV SOLN
0.2000 ug/kg/h | INTRAVENOUS | Status: DC
  Administered 2010-12-19: 0.2 ug/kg/h via INTRAVENOUS
  Administered 2010-12-19: 0.5 ug/kg/h via INTRAVENOUS
  Filled 2010-12-19 (×2): qty 2

## 2010-12-19 MED ORDER — CLINIMIX E/DEXTROSE (5/15) 5 % IV SOLN
INTRAVENOUS | Status: AC
  Administered 2010-12-19: 18:00:00 via INTRAVENOUS
  Filled 2010-12-19: qty 2000

## 2010-12-19 MED ORDER — SODIUM CHLORIDE 0.9 % IJ SOLN
10.0000 mL | INTRAMUSCULAR | Status: DC | PRN
  Administered 2010-12-25 – 2010-12-27 (×2): 10 mL

## 2010-12-19 NOTE — Progress Notes (Signed)
3 Days Post-Op  Subjective: Not able to wean this AM, some agitation OVN  Objective: Vital signs in last 24 hours: Temp:  [98.9 F (37.2 C)-99.6 F (37.6 C)] 98.9 F (37.2 C) (12/04 0400) Pulse Rate:  [86-145] 86  (12/04 0600) Resp:  [11-99] 22  (12/04 0600) BP: (112-156)/(54-74) 156/65 mmHg (12/04 0349) SpO2:  [91 %-100 %] 98 % (12/04 0729) FiO2 (%):  [40 %-40.9 %] 40 % (12/04 0734) Weight:  [96.3 kg (212 lb 4.9 oz)] 212 lb 4.9 oz (96.3 kg) (12/04 0500) Last BM Date:  (PTA)  Intake/Output from previous day: 12/03 0701 - 12/04 0700 In: 2703.5 [I.V.:666; IV Piggyback:662.5; TPN:1025] Out: 2330 [Urine:2100; Emesis/NG output:150; Drains:80] Intake/Output this shift:    On vent Lungs - CTA CV  - reg ABD - soft, quiet, drain output serous, skin VAC in place  Lab Results:   Basename 12/19/10 0405 12/18/10 0300  WBC 11.8* 14.2*  HGB 8.2* 8.6*  HCT 26.4* 27.4*  PLT 266 254   BMET  Basename 12/19/10 0405 12/18/10 0300  NA 149* 144  K 4.3 4.6  CL 118* 116*  CO2 23 20  GLUCOSE 137* 133*  BUN 17 22  CREATININE 1.34 1.29  CALCIUM 9.3 8.8   PT/INR  Basename 12/16/10 1134  LABPROT 15.9*  INR 1.24   ABG  Basename 12/19/10 0316 12/18/10 0500  PHART 7.300* 7.327*  HCO3 22.9 17.8*    Studies/Results: Dg Chest Port 1 View  12/18/2010  *RADIOLOGY REPORT*  Clinical Data: Endotracheal tube placement  PORTABLE CHEST - 1 VIEW  Comparison: 12/ 01/12  Findings: Cardiomediastinal silhouette is stable.  Endotracheal tube with tip 4 cm above the carina.  No convincing pulmonary edema. No diagnostic pneumothorax.  NG tube unchanged in position. Left subclavian central line with tip in SVC stable in position. Persistent left pleural effusion with left lower lobe atelectasis or infiltrate.  Mild right basilar atelectasis.  IMPRESSION:  Endotracheal tube with tip 4 cm above the carina.  No convincing pulmonary edema. No diagnostic pneumothorax.  NG tube unchanged in position.  Left  subclavian central line with tip in SVC stable in position.  Persistent left pleural effusion with left lower lobe atelectasis or infiltrate.  Mild right basilar atelectasis.  Original Report Authenticated By: Lahoma Crocker, M.D.    Anti-infectives: Anti-infectives     Start     Dose/Rate Route Frequency Ordered Stop   12/17/10 1400   vancomycin (VANCOCIN) 1,250 mg in sodium chloride 0.9 % 250 mL IVPB        1,250 mg 166.7 mL/hr over 90 Minutes Intravenous Every 12 hours 12/17/10 1223     12/17/10 0800   fluconazole (DIFLUCAN) IVPB 100 mg        100 mg 50 mL/hr over 60 Minutes Intravenous Every 24 hours 12/16/10 0334     12/16/10 0600   piperacillin-tazobactam (ZOSYN) IVPB 3.375 g        3.375 g 12.5 mL/hr over 240 Minutes Intravenous 3 times per day 12/16/10 0320     12/16/10 0500   fluconazole (DIFLUCAN) IVPB 200 mg        200 mg 100 mL/hr over 60 Minutes Intravenous  Once 12/16/10 0334 12/16/10 0613   12/15/10 2030   ampicillin-sulbactam (UNASYN) 1.5 g in sodium chloride 0.9 % 50 mL IVPB        1.5 g 100 mL/hr over 30 Minutes Intravenous  Once 12/15/10 2027 12/15/10 2127  Assessment/Plan: s/p Procedure(s): EXPLORATORY LAPAROTOMY S/P Patch repair perf DU POD #3 VDRF and sepsis per CCM - appreciate their assist, not weaning well yet ID Vanc/Zosyn/Diflucan, WBC lower FEN TNA for now, pharmacy to adjust Na in TNA WOC RN for [facility] changes R Prox humerus FX sling per ortho VTE heparin  LOS: 4 days    Harry James E 12/19/2010

## 2010-12-19 NOTE — Progress Notes (Addendum)
CRITICAL VALUE ALERT  Critical value received:  Vanc trough - 25.5   Date of notification:  12/19/2010    Time of notification:  5:10 PM  Critical value read back:yes  Nurse who received alert:  Carol Ada, RN  MD notified (1st page): Mckennon Zwart Hearing,  Pharm D notified   Time of first page:    MD notified (2nd page):  Time of second page:  Responding MD:    Time MD responded:

## 2010-12-19 NOTE — Progress Notes (Signed)
Name: CALIN ROMINGER MRN: [id number] DOB: 1950/08/04    LOS: 4  PCCM ADMISSION NOTE  History of Present Illness: This is a 60 year old male who has recently had a fractured right humeral neck. He has been taking NSAIDs.  He woke up with abdominal pain 11/30.  He presented to the emergency room at outside hospital and appeared to have a perforated viscus by CT scan.  Transfered to the ICU post op on vent.  PCCM is asked to assist with cc management.  Lines / Drains: ETT 12/1 >>> L [place] TLC 12/1>>> L radial a-line 12/1>>>12/3 Foley 12/1>>>12/3  Cultures: Blood 12/2>>>NTD Urine 12/2>>>NTD Sputum 12/2>>>NTD  Antibiotics: Unasyn (Per [facility]CS) 12/1 >>12/1 Zosyn 12/1>>> Diflucan 12/2>>> Vancomycin 12/2>>>  Tests / Events: 12/1 to OR to repair ruptured viscus.  Vital Signs: Temp:  [98 F (36.7 C)-99.6 F (37.6 C)] 98 F (36.7 C) (12/04 0800) Pulse Rate:  [86-145] 86  (12/04 0600) Resp:  [11-99] 22  (12/04 0600) BP: (112-156)/(54-74) 156/65 mmHg (12/04 0349) SpO2:  [91 %-100 %] 98 % (12/04 0729) FiO2 (%):  [40 %-40.9 %] 40 % (12/04 0734) Weight:  [96.3 kg (212 lb 4.9 oz)] 212 lb 4.9 oz (96.3 kg) (12/04 0500)  Intake/Output Summary (Last 24 hours) at 12/19/10 0917 Last data filed at 12/19/10 0600  Gross per 24 hour  Intake   2483 ml  Output   1605 ml  Net    878 ml   Physical Examination: General:  Sedated and intubated male, sporadically follows commands. Neuro:  Sedated, intubated, moving all ext spontaneously.  But follows commands occasionally. HEENT:  [place]/AT, PERRL, EOM-spontaneous, -LAN and -thyromegally. Cardiovascular:  RRR, Nl S1/S2, -M/R/G. Lungs:  Coarse BS diffusely. Abdomen:  Soft, NT, ND and -BS. Skin:  Spider angiomas on upper chest.  BMET    Component Value Date/Time   NA 149* 12/19/2010 0405   K 4.3 12/19/2010 0405   CL 118* 12/19/2010 0405   CO2 23 12/19/2010 0405   GLUCOSE 137* 12/19/2010 0405   BUN 17 12/19/2010 0405   CREATININE 1.34 12/19/2010 0405   CALCIUM 9.3 12/19/2010 0405   GFRNONAA 56* 12/19/2010 0405   GFRAA 64* 12/19/2010 0405   CBC    Component Value Date/Time   WBC 11.8* 12/19/2010 0405   RBC 2.74* 12/19/2010 0405   HGB 8.2* 12/19/2010 0405   HCT 26.4* 12/19/2010 0405   PLT 266 12/19/2010 0405   MCV 96.4 12/19/2010 0405   MCH 29.9 12/19/2010 0405   MCHC 31.1 12/19/2010 0405   RDW 14.4 12/19/2010 0405   LYMPHSABS 0.8 12/18/2010 0300   MONOABS 0.7 12/18/2010 0300   EOSABS 0.1 12/18/2010 0300   BASOSABS 0.0 12/18/2010 0300   ABG    Component Value Date/Time   PHART 7.300* 12/19/2010 0316   PCO2ART 48.1* 12/19/2010 0316   PO2ART 80.2 12/19/2010 0316   HCO3 22.9 12/19/2010 0316   TCO2 24.4 12/19/2010 0316   ACIDBASEDEF 2.5* 12/19/2010 0316   O2SAT 96.3 12/19/2010 0316    Ventilator settings: Vent Mode:  [-] PRVC FiO2 (%):  [40 %-40.9 %] 40 % Set Rate:  [22 bmp] 22 bmp Vt Set:  [500 mL] 500 mL PEEP:  [5 cmH20] 5 cmH20 Pressure Support:  [20 cmH20] 20 cmH20 Plateau Pressure:  [17 cmH20-20 cmH20] 19 cmH20  Imaging:  Reviewed.  Please refer to the Assessment and Plan section for relevant results.  Assessment and Plan: 1) Vent dep resp failure post laparotomy - now with pulmonary edema as  well.  Concern for extubation at this point is mental status as the patient does not follow commands appropriately. Plan: PS trials today.  SBT in AM.  Diureses again today.  ? When will be able to use GI tract, will defer to surgery.  Change from cont versed/fent to precedex and PRN fentanyl.   2) AMS: patient is moving everything spontaneously so no need for CT at this time as the exam is non-focal but once off versed/fent and on precedex if not waking up will need to consider an intracranial event. Plan: D/C versed/fent  Precedex drip and PRN fentanyl.  3) Hypotension: developing septic shock, on pressors due to perfed GI tract. Plan: D/C pressors.  Cultures negative.  D/C IVF.  Diflucan.  Vanc.  Cortisol level of 13 but BP is much  more stable today, no need for stress dose steroids.  Continue TPN.  4) DMII - ICU HG protocol.  5) Tachycardia - Scheduled beta blockers today.  6) Peritonitis: Changed unasyn to zosyn/vanc/diflucan and WBC is improving with negative cultures, will keep on current abx for now.  Best practices / Disposition: -->ICU status under [facility]CS, PCCM consulting. -->full code. -->SCDs/[place] Hep for DVT Px -->Protonix for GI Px. -->ventilator bundle. -->TPN.  - Patient was a very difficult airway and is reaching for ETT.  Self extubation would be disastrous for him.  Will keep hands restrained.  The patient is critically ill with multiple organ systems failure and requires high complexity decision making for assessment and support, frequent evaluation and titration of therapies, application of advanced monitoring technologies and extensive interpretation of multiple databases. Critical Care Time devoted to patient care services described in this note is 35 minutes.  Jennet Maduro, M.D.

## 2010-12-19 NOTE — Progress Notes (Signed)
Wasted 20cc versed and 60cc fentanyl wasted in sink with Eustace Pen, RN.  Carol Ada, RN, 12/19/2010, 4:50 PM.

## 2010-12-19 NOTE — Progress Notes (Signed)
ANTIBIOTIC CONSULT NOTE - Follow-Up  Pharmacy Consult:  Vancomycin Indication: Perforated ulcer repair  No Known Allergies  Patient Measurements: Height: 5\' 5"  (165.1 cm) Weight: 212 lb 4.9 oz (96.3 kg) IBW/kg (Calculated) : 61.5    Vital Signs: Temp: 98.5 F (36.9 C) (12/04 1200) Temp src: Oral (12/04 1200) BP: 115/97 mmHg (12/04 1700) Pulse Rate: 106  (12/04 1700) Intake/Output from previous day: 12/03 0701 - 12/04 0700 In: 2761 [I.V.:678.5; IV Piggyback:662.5; TPN:1070] Out: 2330 [Urine:2100; Emesis/NG output:150; Drains:80] Intake/Output from this shift: Total I/O In: 998.1 [I.V.:188.1; IV Piggyback:360; TPN:450] Out: 850 [Urine:850]  Labs:  [facility] 12/19/10 0405 12/18/10 0300 12/17/10 0345  WBC 11.8* 14.2* 17.2*  HGB 8.2* 8.6* 8.5*  PLT 266 254 227  LABCREA -- -- --  CREATININE 1.34 1.29 1.17   Estimated Creatinine Clearance: 61.7 ml/min (by C-G formula based on Cr of 1.34).  Basename 12/19/10 1535  VANCOTROUGH 25.5*  VANCOPEAK --  Jake Michaelis --  GENTTROUGH --  GENTPEAK --  GENTRANDOM --  TOBRATROUGH --  TOBRAPEAK --  TOBRARND --  AMIKACINPEAK --  AMIKACINTROU --  AMIKACIN --     Microbiology: Recent Results (from the past 720 hour(s))  MRSA PCR SCREENING     Status: Normal   Collection Time   12/16/10  3:09 AM      Component Value Range Status Comment   MRSA by PCR NEGATIVE  NEGATIVE  Final   URINE CULTURE     Status: Normal   Collection Time   12/17/10 10:11 AM      Component Value Range Status Comment   Specimen Description URINE, CLEAN CATCH   Final    Special Requests NONE   Final    Setup Time [id number]   Final    Colony Count NO GROWTH   Final    Culture NO GROWTH   Final    Report Status 12/18/2010 FINAL   Final   CULTURE, BLOOD (ROUTINE X 2)     Status: Normal (Preliminary result)   Collection Time   12/17/10 10:20 AM      Component Value Range Status Comment   Specimen Description BLOOD LEFT FOOT   Final    Special Requests  BOTTLES DRAWN AEROBIC ONLY 5CC   Final    Setup Time [id number]   Final    Culture     Final    Value:        BLOOD CULTURE RECEIVED NO GROWTH TO DATE CULTURE WILL BE HELD FOR 5 DAYS BEFORE ISSUING A FINAL NEGATIVE REPORT   Report Status PENDING   Incomplete   CULTURE, BLOOD (ROUTINE X 2)     Status: Normal (Preliminary result)   Collection Time   12/17/10 10:40 AM      Component Value Range Status Comment   Specimen Description BLOOD RIGHT FOOT   Final    Special Requests BOTTLES DRAWN AEROBIC ONLY 5CC   Final    Setup Time [id number]   Final    Culture     Final    Value:        BLOOD CULTURE RECEIVED NO GROWTH TO DATE CULTURE WILL BE HELD FOR 5 DAYS BEFORE ISSUING A FINAL NEGATIVE REPORT   Report Status PENDING   Incomplete   CULTURE, RESPIRATORY     Status: Normal (Preliminary result)   Collection Time   12/17/10 11:11 AM      Component Value Range Status Comment   Specimen Description TRACHEAL ASPIRATE   Final  Special Requests NONE   Final    Gram Stain     Final    Value: ABUNDANT WBC PRESENT,BOTH PMN AND MONONUCLEAR     RARE SQUAMOUS EPITHELIAL CELLS PRESENT     NO ORGANISMS SEEN   Culture NO GROWTH 2 DAYS   Final    Report Status PENDING   Incomplete     Medical History: Past Medical History  Diagnosis Date  . Diabetes mellitus   . Hypertension      Assessment: 60 yo male s/p repair of perforated duodenal ulcer on empiric vanc, Zosyn, and fluconazole.  Renal fxn has improved but is starting to worsen.  Vanc trough supratherapeutic for goal ~15 mcg/mL.  Plan:  1.  Change vanc to 750mg  IV Q12H 2.  Continue Zosyn and fluconazole at current doses 3.  Monitor UOP, vanc trough when indicated  Johnnette Gourd, PharmD 12/19/2010,5:19 PM

## 2010-12-19 NOTE — Progress Notes (Signed)
PARENTERAL NUTRITION CONSULT NOTE - FOLLOW UP  Pharmacy Consult for TPN Indication: perforated DU s/p patch repair  No Known Allergies  Patient Measurements: Height: 5\' 5"  (165.1 cm) Weight: 212 lb 4.9 oz (96.3 kg) IBW/kg (Calculated) : 61.5   Vital Signs: Temp: 98.9 F (37.2 C) (12/04 0400) Temp src: Oral (12/04 0400) BP: 156/65 mmHg (12/04 0349) Pulse Rate: 86  (12/04 0600) Intake/Output from previous day: 12/03 0701 - 12/04 0700 In: 2703.5 [I.V.:666; IV Piggyback:662.5; TPN:1025] Out: 2330 [Urine:2100; Emesis/NG output:150; Drains:80] Intake/Output from this shift:  + 426 today, -3580 yesterday  Labs:  [facility] 12/19/10 0405 12/18/10 0300 12/17/10 0345 12/16/10 1134  WBC 11.8* 14.2* 17.2* --  HGB 8.2* 8.6* 8.5* --  HCT 26.4* 27.4* 25.8* --  PLT 266 254 227 --  APTT -- -- -- --  INR -- -- -- 1.24     Basename 12/19/10 0405 12/18/10 0300 12/17/10 0345  NA 149* 144 140  K 4.3 4.6 4.4  CL 118* 116* 118*  CO2 23 20 16*  GLUCOSE 137* 133* 72  BUN 17 22 30*  CREATININE 1.34 1.29 1.17  LABCREA -- -- --  CREAT24HRUR -- -- --  CALCIUM 9.3 8.8 6.4*  MG 2.2 2.5 1.5  PHOS 2.7 2.9 2.8  PROT 5.3* 5.1* 4.1*  ALBUMIN 1.9* 1.9* 1.7*  AST 16 15 25   ALT 17 14 12   ALKPHOS 89 76 68  BILITOT 2.2* 2.5* 0.9  BILIDIR -- -- --  IBILI -- -- --  PREALBUMIN -- 11.6* --  CHOLHDL -- -- --  CHOL -- 84 --   Estimated Creatinine Clearance: 61.7 ml/min (by C-G formula based on Cr of 1.34).    Basename 12/19/10 0748 12/19/10 0409 12/18/10 2338  GLUCAP 145* 129* 130*    Medications:  Scheduled:     . antiseptic oral rinse  15 mL Mouth Rinse QID  . chlorhexidine  15 mL Mouth Rinse BID  . fluconazole (DIFLUCAN) IV  100 mg Intravenous Q24H  . heparin  5,000 Units Subcutaneous Q8H  . insulin aspart  0-15 Units Subcutaneous Q4H  . ipratropium  0.5 mg Nebulization Q6H  . levalbuterol  0.63 mg Nebulization Q6H  . metoprolol  5 mg Intravenous Q6H  . pantoprazole (PROTONIX) IV   40 mg Intravenous Q12H  . piperacillin-tazobactam (ZOSYN)  IV  3.375 g Intravenous Q8H  . sodium chloride  1,000 mL Intravenous Once  . vancomycin  1,250 mg Intravenous Q12H   Infusions:     . sodium chloride 10 mL/hr at 12/18/10 0900  . TPN (CLINIMIX) +/- additives 40 mL/hr at 12/18/10 1824   And  . fat emulsion 250 mL (12/18/10 1823)  . fentaNYL infusion INTRAVENOUS 100 mcg/hr (12/19/10 0600)  . midazolam (VERSED) infusion 2 mg/hr (12/19/10 0600)  . TPN (CLINIMIX) +/- additives 40 mL/hr at 12/18/10 0700  . DISCONTD: sodium chloride 125 mL/hr at 12/18/10 0900    Insulin Requirements in the past 24 hours:  8 units SSI  Current Nutrition:  NPO  Assessment: 2 YOM s/p Ex-lap and patch repair of perforated DU on 12/1. Appears was taking NSAIDs for pain control following LE fracture.  He is currently intubated and sedated.   F: NS 24ml/hr E: K 4.3         Corrected Ca 10.98 Nutritional Goals: protein 115-130 gm. Permissive underfeeding goal 1350-1550 kcal   Plan:  1. Increase TPN to 60 ml/hr. 2. Due to national backorder, lipids, MVI, and trace elements to be given  MWF.  3.Unable to adjust lytes with clinimix formula.  Will change IV fluids to 1/2 NS. 4.  f/u labs in am   Excell Seltzer Poteet 12/19/2010,8:29 AM

## 2010-12-20 ENCOUNTER — Inpatient Hospital Stay (HOSPITAL_COMMUNITY): Payer: BC Managed Care – PPO

## 2010-12-20 LAB — CBC
Hemoglobin: 8.6 g/dL — ABNORMAL LOW (ref 13.0–17.0)
MCH: 30.3 pg (ref 26.0–34.0)
RBC: 2.84 MIL/uL — ABNORMAL LOW (ref 4.22–5.81)

## 2010-12-20 LAB — BASIC METABOLIC PANEL
CO2: 24 mEq/L (ref 19–32)
Calcium: 9.5 mg/dL (ref 8.4–10.5)
Glucose, Bld: 137 mg/dL — ABNORMAL HIGH (ref 70–99)
Sodium: 147 mEq/L — ABNORMAL HIGH (ref 135–145)

## 2010-12-20 LAB — BLOOD GAS, ARTERIAL
Acid-base deficit: 2.4 mmol/L — ABNORMAL HIGH (ref 0.0–2.0)
FIO2: 0.4 %
MECHVT: 500 mL
O2 Saturation: 98.3 %
Patient temperature: 98.6
TCO2: 24 mmol/L (ref 0–100)
pH, Arterial: 7.328 — ABNORMAL LOW (ref 7.350–7.450)

## 2010-12-20 LAB — GLUCOSE, CAPILLARY
Glucose-Capillary: 162 mg/dL — ABNORMAL HIGH (ref 70–99)
Glucose-Capillary: 150 mg/dL — ABNORMAL HIGH (ref 70–99)
Glucose-Capillary: 136 mg/dL — ABNORMAL HIGH (ref 70–99)

## 2010-12-20 LAB — CULTURE, RESPIRATORY W GRAM STAIN: Culture: NO GROWTH

## 2010-12-20 LAB — PHOSPHORUS: Phosphorus: 3.3 mg/dL (ref 2.3–4.6)

## 2010-12-20 MED ORDER — FUROSEMIDE 10 MG/ML IJ SOLN
40.0000 mg | Freq: Four times a day (QID) | INTRAMUSCULAR | Status: AC
  Administered 2010-12-20 (×3): 40 mg via INTRAVENOUS
  Filled 2010-12-20: qty 4

## 2010-12-20 MED ORDER — METOLAZONE 5 MG PO TABS
5.0000 mg | ORAL_TABLET | Freq: Every day | ORAL | Status: AC
  Administered 2010-12-20: 5 mg via ORAL
  Filled 2010-12-20: qty 1

## 2010-12-20 MED ORDER — M.V.I. ADULT IV INJ
INJECTION | INTRAVENOUS | Status: AC
  Administered 2010-12-20: 18:00:00 via INTRAVENOUS
  Filled 2010-12-20: qty 2000

## 2010-12-20 MED ORDER — FAT EMULSION 20 % IV EMUL
250.0000 mL | INTRAVENOUS | Status: AC
  Administered 2010-12-20: 250 mL via INTRAVENOUS
  Filled 2010-12-20: qty 250

## 2010-12-20 NOTE — Progress Notes (Signed)
PARENTERAL NUTRITION CONSULT NOTE - FOLLOW UP  Pharmacy Consult for TPN Indication: perforated DU s/p patch repair  No Known Allergies  Patient Measurements: Height: 5\' 5"  (165.1 cm) Weight: 212 lb 4.9 oz (96.3 kg) IBW/kg (Calculated) : 61.5   Vital Signs: Temp: 97.9 F (36.6 C) (12/05 0400) Temp src: Oral (12/05 0400) BP: 124/67 mmHg (12/05 0700) Pulse Rate: 57  (12/05 0726) Intake/Output from previous day: 12/04 0701 - 12/05 0700 In: 2386.8 [I.V.:649.6; IV Piggyback:460; TPN:1277.3] Out: 1230 [Urine:850; Emesis/NG output:300; Drains:80] Intake/Output from this shift:  + 426 today, -3580 yesterday  Labs:  [facility] 12/20/10 0330 12/19/10 0405 12/18/10 0300  WBC 9.6 11.8* 14.2*  HGB 8.6* 8.2* 8.6*  HCT 27.1* 26.4* 27.4*  PLT 248 266 254  APTT -- -- --  INR -- -- --     Basename 12/20/10 0330 12/19/10 0405 12/18/10 0300  NA 147* 149* 144  K 4.0 4.3 4.6  CL 116* 118* 116*  CO2 24 23 20   GLUCOSE 137* 137* 133*  BUN 21 17 22   CREATININE 1.24 1.34 1.29  LABCREA -- -- --  CREAT24HRUR -- -- --  CALCIUM 9.5 9.3 8.8  MG 2.3 2.2 2.5  PHOS 3.3 2.7 2.9  PROT -- 5.3* 5.1*  ALBUMIN -- 1.9* 1.9*  AST -- 16 15  ALT -- 17 14  ALKPHOS -- 89 76  BILITOT -- 2.2* 2.5*  BILIDIR -- -- --  IBILI -- -- --  PREALBUMIN -- -- 11.6*  CHOLHDL -- -- --  CHOL -- -- 84   Estimated Creatinine Clearance: 66.7 ml/min (by C-G formula based on Cr of 1.24).    Basename 12/20/10 0340 12/20/10 12/19/10 1942  GLUCAP 134* 162* 148*    Medications:  Scheduled:     . antiseptic oral rinse  15 mL Mouth Rinse QID  . chlorhexidine  15 mL Mouth Rinse BID  . fluconazole (DIFLUCAN) IV  100 mg Intravenous Q24H  . heparin  5,000 Units Subcutaneous Q8H  . insulin aspart  0-15 Units Subcutaneous Q4H  . ipratropium  0.5 mg Nebulization Q6H  . levalbuterol  0.63 mg Nebulization Q6H  . metoprolol  10 mg Intravenous Q6H  . pantoprazole (PROTONIX) IV  40 mg Intravenous Q12H  .  piperacillin-tazobactam (ZOSYN)  IV  3.375 g Intravenous Q8H  . sodium chloride  1,000 mL Intravenous Once  . sodium chloride  10 mL Intracatheter Q12H  . vancomycin  750 mg Intravenous Q12H  . DISCONTD: metoprolol  5 mg Intravenous Q6H  . DISCONTD: vancomycin  1,250 mg Intravenous Q12H   Infusions:     . dexmedetomidine (PRECEDEX) IV infusion for high rates 0.5 mcg/kg/hr (12/20/10 0601)  . dextrose 10 mL (12/19/10 1615)  . dextrose 20 mL (12/19/10 1840)  . TPN (CLINIMIX) +/- additives 40 mL/hr at 12/19/10 0700   And  . fat emulsion Stopped (12/19/10 1751)  . TPN (CLINIMIX) +/- additives 60 mL/hr at 12/19/10 1751  . DISCONTD: sodium chloride Stopped (12/19/10 1027)  . DISCONTD: sodium chloride 10 mL/hr at 12/18/10 0900  . DISCONTD: dexmedetomidine (PRECEDEX) IV infusion 0.5 mcg/kg/hr (12/19/10 1219)  . DISCONTD: fentaNYL infusion INTRAVENOUS Stopped (12/19/10 1029)  . DISCONTD: midazolam (VERSED) infusion Stopped (12/19/10 1029)    Insulin Requirements in the past 24 hours:  8 units SSI  Current Nutrition:  NPO  Assessment: 42 YOM s/p Ex-lap and patch repair of perforated DU on 12/1. Appears was taking NSAIDs for pain control following LE fracture.  He is currently intubated  and sedated.   F: 1/2NS 53ml/hr E: K 4.0, Na 147       Corrected Ca 10.98 Nutritional Goals: protein 115-130 gm. Permissive underfeeding goal 1350-1550 kcal   Plan:  1. Increase TPN to 80 ml/hr. 2. Due to national backorder, lipids, MVI, and trace elements to be given MWF.  3.f/u am labs, cbgs. 4.  f/u labs in am   Kimarie Coor Poteet 12/20/2010,8:30 AM

## 2010-12-20 NOTE — Progress Notes (Signed)
ANTIBIOTIC CONSULT NOTE - Follow-Up  Pharmacy Consult:  Vancomycin/Zosyn/Fluconazole Indication: Perforated ulcer repair  No Known Allergies  Patient Measurements: Height: 5\' 5"  (165.1 cm) Weight: 212 lb 4.9 oz (96.3 kg) IBW/kg (Calculated) : 61.5    Vital Signs: Temp: 98.4 F (36.9 C) (12/05 0800) Temp src: Oral (12/05 0800) BP: 125/80 mmHg (12/05 0900) Pulse Rate: 72  (12/05 0938) Intake/Output from previous day: 12/04 0701 - 12/05 0700 In: 2386.8 [I.V.:649.6; IV Piggyback:460; TPN:1277.3] Out: 1230 [Urine:850; Emesis/NG output:300; Drains:80] Intake/Output from this shift: Total I/O In: 234 [I.V.:64; IV Piggyback:50; TPN:120] Out: 300 [Urine:300]  Labs:  [facility] 12/20/10 0330 12/19/10 0405 12/18/10 0300  WBC 9.6 11.8* 14.2*  HGB 8.6* 8.2* 8.6*  PLT 248 266 254  LABCREA -- -- --  CREATININE 1.24 1.34 1.29   Estimated Creatinine Clearance: 66.7 ml/min (by C-G formula based on Cr of 1.24).  Basename 12/19/10 1535  VANCOTROUGH 25.5*  VANCOPEAK --  Jake Michaelis --  GENTTROUGH --  GENTPEAK --  GENTRANDOM --  TOBRATROUGH --  TOBRAPEAK --  TOBRARND --  AMIKACINPEAK --  AMIKACINTROU --  AMIKACIN --     Microbiology: Recent Results (from the past 720 hour(s))  MRSA PCR SCREENING     Status: Normal   Collection Time   12/16/10  3:09 AM      Component Value Range Status Comment   MRSA by PCR NEGATIVE  NEGATIVE  Final   URINE CULTURE     Status: Normal   Collection Time   12/17/10 10:11 AM      Component Value Range Status Comment   Specimen Description URINE, CLEAN CATCH   Final    Special Requests NONE   Final    Setup Time [id number]   Final    Colony Count NO GROWTH   Final    Culture NO GROWTH   Final    Report Status 12/18/2010 FINAL   Final   CULTURE, BLOOD (ROUTINE X 2)     Status: Normal (Preliminary result)   Collection Time   12/17/10 10:20 AM      Component Value Range Status Comment   Specimen Description BLOOD LEFT FOOT   Final    Special  Requests BOTTLES DRAWN AEROBIC ONLY 5CC   Final    Setup Time [id number]   Final    Culture     Final    Value:        BLOOD CULTURE RECEIVED NO GROWTH TO DATE CULTURE WILL BE HELD FOR 5 DAYS BEFORE ISSUING A FINAL NEGATIVE REPORT   Report Status PENDING   Incomplete   CULTURE, BLOOD (ROUTINE X 2)     Status: Normal (Preliminary result)   Collection Time   12/17/10 10:40 AM      Component Value Range Status Comment   Specimen Description BLOOD RIGHT FOOT   Final    Special Requests BOTTLES DRAWN AEROBIC ONLY 5CC   Final    Setup Time [id number]   Final    Culture     Final    Value:        BLOOD CULTURE RECEIVED NO GROWTH TO DATE CULTURE WILL BE HELD FOR 5 DAYS BEFORE ISSUING A FINAL NEGATIVE REPORT   Report Status PENDING   Incomplete   CULTURE, RESPIRATORY     Status: Normal   Collection Time   12/17/10 11:11 AM      Component Value Range Status Comment   Specimen Description TRACHEAL ASPIRATE   Final    Special  Requests NONE   Final    Gram Stain     Final    Value: ABUNDANT WBC PRESENT,BOTH PMN AND MONONUCLEAR     RARE SQUAMOUS EPITHELIAL CELLS PRESENT     NO ORGANISMS SEEN   Culture NO GROWTH 2 DAYS   Final    Report Status 12/20/2010 FINAL   Final     Medical History: Past Medical History  Diagnosis Date  . Diabetes mellitus   . Hypertension      Assessment: 60 yo male s/p repair of perforated duodenal ulcer on empiric vanc, Zosyn, and fluconazole.  Renal fxn has improved but is starting to worsen.  Vanc trough supratherapeutic for goal ~15 mcg/mL last night, dose adjusted.  Plan:  1.  Cont vanc to 750mg  IV Q12H 2.  Continue Zosyn and fluconazole at current doses 3.  Monitor UOP, vanc trough when indicated  [place] Crane, Pharm D 12/20/2010 ,9:49 AM

## 2010-12-20 NOTE — Consult Note (Signed)
Pt AMS unable to counsel.

## 2010-12-20 NOTE — Progress Notes (Signed)
4 Days Post-Op  Subjective: On vent  Objective: Vital signs in last 24 hours: Temp:  [97.6 F (36.4 C)-98.5 F (36.9 C)] 97.9 F (36.6 C) (12/05 0400) Pulse Rate:  [56-106] 57  (12/05 0726) Resp:  [19-26] 22  (12/05 0726) BP: (115-148)/(67-97) 124/67 mmHg (12/05 0700) SpO2:  [93 %-100 %] 100 % (12/05 0726) FiO2 (%):  [40 %-40.6 %] 40 % (12/05 0700) Last BM Date:  (PTA)  Intake/Output from previous day: 12/04 0701 - 12/05 0700 In: 2386.8 [I.V.:649.6; IV Piggyback:460; TPN:1277.3] Out: 1230 [Urine:850; Emesis/NG output:300; Drains:80] Intake/Output this shift:    Resp: Rhonchi L>R Cardio: S1, S2 normal GI: soft, skin VAC in place, quiet, drain output serous Extremities: sling to be adjusted by ortho  tech  Lab Results:   Basename 12/20/10 0330 12/19/10 0405  WBC 9.6 11.8*  HGB 8.6* 8.2*  HCT 27.1* 26.4*  PLT 248 266   BMET  Basename 12/20/10 0330 12/19/10 0405  NA 147* 149*  K 4.0 4.3  CL 116* 118*  CO2 24 23  GLUCOSE 137* 137*  BUN 21 17  CREATININE 1.24 1.34  CALCIUM 9.5 9.3   PT/INR No results found for this basename: LABPROT:2,INR:2 in the last 72 hours ABG  Basename 12/20/10 0310 12/19/10 0316  PHART 7.328* 7.300*  HCO3 22.7 22.9    Studies/Results: Dg Chest Port 1 View  12/20/2010  *RADIOLOGY REPORT*  Clinical Data: Endotracheal tube placement.  Evaluate support apparatus.  PORTABLE CHEST - 1 VIEW  Comparison: 12/19/2010.  Findings: The endotracheal tube, nasogastric tube and left subclavian central line are unchanged compared yesterday's exam. Pulmonary aeration is also unchanged with left greater than right airspace disease, left effusion and left greater than right atelectasis.  IMPRESSION:  1.  Stable support apparatus. 2.  Unchanged pulmonary aeration with left greater than right airspace disease, left effusion and left greater than right basilar atelectasis.  Original Report Authenticated By: Dereck Ligas, M.D.   Dg Chest Port 1  View  12/19/2010  *RADIOLOGY REPORT*  Clinical Data: Endotracheal tube placement.  PORTABLE CHEST - 1 VIEW  Comparison: 12/18/2010.  Findings: Left subclavian central line unchanged.  Endotracheal tube 27 mm from the carina.  Nasogastric tube passes within the esophagus.  Left greater than right bilateral airspace disease, likely representing asymmetric pulmonary edema.  Probable left pleural effusion.  Dense retrocardiac opacity likely representing atelectasis and effusion in conjunction with airspace disease.  IMPRESSION:  1.  Support apparatus in good position. 2.  Left greater than right airspace disease and atelectasis unchanged.  Original Report Authenticated By: Dereck Ligas, M.D.    Anti-infectives: Anti-infectives     Start     Dose/Rate Route Frequency Ordered Stop   12/20/10 1200   vancomycin (VANCOCIN) 750 mg in sodium chloride 0.9 % 150 mL IVPB        750 mg 150 mL/hr over 60 Minutes Intravenous Every 12 hours 12/19/10 1722     12/17/10 1400   vancomycin (VANCOCIN) 1,250 mg in sodium chloride 0.9 % 250 mL IVPB  Status:  Discontinued        1,250 mg 166.7 mL/hr over 90 Minutes Intravenous Every 12 hours 12/17/10 1223 12/19/10 1722   12/17/10 0800   fluconazole (DIFLUCAN) IVPB 100 mg        100 mg 50 mL/hr over 60 Minutes Intravenous Every 24 hours 12/16/10 0334     12/16/10 0600   piperacillin-tazobactam (ZOSYN) IVPB 3.375 g        3.375 g  12.5 mL/hr over 240 Minutes Intravenous 3 times per day 12/16/10 0320     12/16/10 0500   fluconazole (DIFLUCAN) IVPB 200 mg        200 mg 100 mL/hr over 60 Minutes Intravenous  Once 12/16/10 0334 12/16/10 [id number]   12/15/10 2030   ampicillin-sulbactam (UNASYN) 1.5 g in sodium chloride 0.9 % 50 mL IVPB        1.5 g 100 mL/hr over 30 Minutes Intravenous  Once 12/15/10 2027 12/15/10 2127          Assessment/Plan: s/p Procedure(s): EXPLORATORY LAPAROTOMY S/P Patch repair perf DU POD #4 VDRF and sepsis per CCM - appreciate their  assist, not weaning well yet, on precedex ID Vanc/Zosyn/Diflucan, WBC now WNL FEN TNA for now, pharmacy to adjust Na in TNA, Na improving WOC RN for [facility] changes R Prox humerus FX sling per ortho - ortho tech to adjust sling VTE heparin  LOS: 5 days    Harry James 12/20/2010

## 2010-12-20 NOTE — Progress Notes (Signed)
Harry James 60 y.o. 12/15/2010   Lab. Results:  Basename 12/20/10 0330 12/19/10 0405  WBC 9.6 11.8*  HGB 8.6* 8.2*  HCT 27.1* 26.4*  PLT 248 266   BMET  Basename 12/20/10 0330 12/19/10 0405  NA 147* 149*  K 4.0 4.3  CL 116* 118*  CO2 24 23  GLUCOSE 137* 137*  BUN 21 17  CREATININE 1.24 1.34  CALCIUM 9.5 9.3    INR  Date Value Range Status  12/16/2010 1.24  0.00-1.49 (no units) Final   VITALS Filed Vitals:   12/20/10 1800  BP: 131/70  Pulse: 57  Temp:   Resp: 21     Subjective Remains intubated Objective UE pulses intact No gross deformity of the right shoulder Cap refill< 2 sec in R UE  Assessment/ Plan Dr Collier Salina is aware No acute ortho issues Shoulder stable in sling Will sign-off. If issues arise please contact either myself or Dr Derek Mound D 12/5/20127:16 PM

## 2010-12-20 NOTE — Progress Notes (Signed)
Name: Harry James MRN: [id number] DOB: [birth year]    LOS: 5  PCCM ADMISSION NOTE  History of Present Illness: This is a 60 year old male who has recently had a fractured right humeral neck. He has been taking NSAIDs.  He woke up with abdominal pain 11/30.  He presented to the emergency room at outside hospital and appeared to have a perforated viscus by CT scan.  Transfered to the ICU post op on vent.  PCCM is asked to assist with cc management.  Lines / Drains: ETT 12/1 >>> L [place] TLC 12/1>>> L radial a-line 12/1>>>12/3 Foley 12/1>>>12/3  Cultures: Blood 12/2>>>NTD Urine 12/2>>>NTD Sputum 12/2>>>NTD  Antibiotics: Unasyn (Per CCS) 12/1 >>12/1 Zosyn 12/1>>> Diflucan 12/2>>> Vancomycin 12/2>>>  Tests / Events: 12/1 to OR to repair ruptured viscus.  Vital Signs: Temp:  [97.6 F (36.4 C)-98.5 F (36.9 C)] 98.4 F (36.9 C) (12/05 0800) Pulse Rate:  [56-106] 73  (12/05 0800) Resp:  [19-26] 19  (12/05 0800) BP: (115-148)/(67-97) 141/79 mmHg (12/05 0800) SpO2:  [93 %-100 %] 100 % (12/05 0800) FiO2 (%):  [40 %-40.6 %] 40 % (12/05 0800)  Intake/Output Summary (Last 24 hours) at 12/20/10 0930 Last data filed at 12/20/10 0900  Gross per 24 hour  Intake 2455.84 ml  Output   1255 ml  Net 1200.84 ml   Physical Examination: General:  Sedated and intubated male, sporadically follows commands. Neuro:  Sedated, intubated, moving all ext spontaneously.  But follows commands occasionally. HEENT:  [place]/AT, PERRL, EOM-spontaneous, -LAN and -thyromegally. Cardiovascular:  RRR, Nl S1/S2, -M/R/G. Lungs:  Coarse BS diffusely. Abdomen:  Soft, NT, ND and -BS. Skin:  Spider angiomas on upper chest.  BMET    Component Value Date/Time   NA 147* 12/20/2010 0330   K 4.0 12/20/2010 0330   CL 116* 12/20/2010 0330   CO2 24 12/20/2010 0330   GLUCOSE 137* 12/20/2010 0330   BUN 21 12/20/2010 0330   CREATININE 1.24 12/20/2010 0330   CALCIUM 9.5 12/20/2010 0330   GFRNONAA 61* 12/20/2010 0330   GFRAA  71* 12/20/2010 0330   CBC    Component Value Date/Time   WBC 9.6 12/20/2010 0330   RBC 2.84* 12/20/2010 0330   HGB 8.6* 12/20/2010 0330   HCT 27.1* 12/20/2010 0330   PLT 248 12/20/2010 0330   MCV 95.4 12/20/2010 0330   MCH 30.3 12/20/2010 0330   MCHC 31.7 12/20/2010 0330   RDW 14.1 12/20/2010 0330   LYMPHSABS 0.8 12/18/2010 0300   MONOABS 0.7 12/18/2010 0300   EOSABS 0.1 12/18/2010 0300   BASOSABS 0.0 12/18/2010 0300   ABG    Component Value Date/Time   PHART 7.328* 12/20/2010 0310   PCO2ART 44.5 12/20/2010 0310   PO2ART 111.0* 12/20/2010 0310   HCO3 22.7 12/20/2010 0310   TCO2 24.0 12/20/2010 0310   ACIDBASEDEF 2.4* 12/20/2010 0310   O2SAT 98.3 12/20/2010 0310    Ventilator settings: Vent Mode:  [-] PRVC FiO2 (%):  [40 %-40.6 %] 40 % Set Rate:  [22 bmp] 22 bmp Vt Set:  [500 mL] 500 mL PEEP:  [5 cmH20] 5 cmH20 Pressure Support:  [12 cmH20] 12 cmH20 Plateau Pressure:  [18 cmH20-27 cmH20] 26 cmH20  Imaging:  Reviewed.  Please refer to the Assessment and Plan section for relevant results.  Assessment and Plan: 1) Vent dep resp failure post laparotomy - now with pulmonary edema as well.  Concern for extubation at this point is mental status as the patient does not follow commands appropriately. Plan:  PS trials today.  SBT in AM.  Diureses again today.  ? When will be able to use GI tract, will defer to surgery.  Continue precedex and PRN fentanyl.   2) AMS: patient is moving everything spontaneously so no need for CT at this time as the exam is non-focal but once off versed/fent and on precedex if not waking up will need to consider an intracranial event. Plan: Precedex drip and PRN fentanyl.  3) Hypotension: developing septic shock, on pressors due to perfed GI tract. Plan: D/C pressors.  Cultures negative.  D/C IVF.  Diflucan.  Vanc.  Cortisol level of 13 but BP is much more stable today, no need for stress dose steroids.  Continue TPN.  4) DMII - ICU HG protocol.  5)  Tachycardia - Scheduled beta blockers today.  6) Peritonitis: Changed unasyn to zosyn/vanc/diflucan and WBC is improving with negative cultures, will keep on current abx for now.  Best practices / Disposition: -->ICU status under CCS, PCCM consulting. -->full code. -->SCDs/[place] Hep for DVT Px -->Protonix for GI Px. -->ventilator bundle. -->TPN.  - Patient was a very difficult airway and is reaching for ETT.  Self extubation would be disastrous for him.  Will keep hands restrained.  The patient is critically ill with multiple organ systems failure and requires high complexity decision making for assessment and support, frequent evaluation and titration of therapies, application of advanced monitoring technologies and extensive interpretation of multiple databases. Critical Care Time devoted to patient care services described in this note is 35 minutes.  Jennet Maduro, M.D.

## 2010-12-21 ENCOUNTER — Inpatient Hospital Stay (HOSPITAL_COMMUNITY): Payer: BC Managed Care – PPO

## 2010-12-21 DIAGNOSIS — J9600 Acute respiratory failure, unspecified whether with hypoxia or hypercapnia: Secondary | ICD-10-CM

## 2010-12-21 DIAGNOSIS — R0902 Hypoxemia: Secondary | ICD-10-CM

## 2010-12-21 DIAGNOSIS — A419 Sepsis, unspecified organism: Secondary | ICD-10-CM

## 2010-12-21 DIAGNOSIS — K659 Peritonitis, unspecified: Secondary | ICD-10-CM

## 2010-12-21 DIAGNOSIS — R6521 Severe sepsis with septic shock: Secondary | ICD-10-CM

## 2010-12-21 LAB — PRO B NATRIURETIC PEPTIDE: Pro B Natriuretic peptide (BNP): 377.9 pg/mL — ABNORMAL HIGH (ref 0–125)

## 2010-12-21 LAB — GLUCOSE, CAPILLARY
Glucose-Capillary: 158 mg/dL — ABNORMAL HIGH (ref 70–99)
Glucose-Capillary: 162 mg/dL — ABNORMAL HIGH (ref 70–99)
Glucose-Capillary: 179 mg/dL — ABNORMAL HIGH (ref 70–99)
Glucose-Capillary: 195 mg/dL — ABNORMAL HIGH (ref 70–99)

## 2010-12-21 LAB — COMPREHENSIVE METABOLIC PANEL
Albumin: 2.2 g/dL — ABNORMAL LOW (ref 3.5–5.2)
BUN: 28 mg/dL — ABNORMAL HIGH (ref 6–23)
Calcium: 10.2 mg/dL (ref 8.4–10.5)
Chloride: 103 mEq/L (ref 96–112)
Creatinine, Ser: 1.28 mg/dL (ref 0.50–1.35)
Total Bilirubin: 1.3 mg/dL — ABNORMAL HIGH (ref 0.3–1.2)

## 2010-12-21 LAB — LACTIC ACID, PLASMA: Lactic Acid, Venous: 1.3 mmol/L (ref 0.5–2.2)

## 2010-12-21 LAB — CBC
Hemoglobin: 9.6 g/dL — ABNORMAL LOW (ref 13.0–17.0)
MCHC: 31.9 g/dL (ref 30.0–36.0)
RDW: 13.6 % (ref 11.5–15.5)

## 2010-12-21 LAB — BLOOD GAS, ARTERIAL
Acid-Base Excess: 5.3 mmol/L — ABNORMAL HIGH (ref 0.0–2.0)
Bicarbonate: 29.3 mEq/L — ABNORMAL HIGH (ref 20.0–24.0)
FIO2: 0.4 %
O2 Saturation: 95.3 %
RATE: 22 resp/min
pO2, Arterial: 74.7 mmHg — ABNORMAL LOW (ref 80.0–100.0)

## 2010-12-21 LAB — BASIC METABOLIC PANEL
CO2: 30 mEq/L (ref 19–32)
Calcium: 9.8 mg/dL (ref 8.4–10.5)
Creatinine, Ser: 1.4 mg/dL — ABNORMAL HIGH (ref 0.50–1.35)
GFR calc non Af Amer: 53 mL/min — ABNORMAL LOW (ref >90)
Sodium: 142 mEq/L (ref 135–145)

## 2010-12-21 LAB — PHOSPHORUS
Phosphorus: 3.5 mg/dL (ref 2.3–4.6)
Phosphorus: 3.4 mg/dL (ref 2.3–4.6)

## 2010-12-21 LAB — MAGNESIUM
Magnesium: 1.5 mg/dL (ref 1.5–2.5)
Magnesium: 1.9 mg/dL (ref 1.5–2.5)

## 2010-12-21 MED ORDER — INSULIN REGULAR HUMAN 100 UNIT/ML IJ SOLN
INTRAVENOUS | Status: AC
  Administered 2010-12-21: 18:00:00 via INTRAVENOUS
  Filled 2010-12-21: qty 2000

## 2010-12-21 MED ORDER — PNEUMOCOCCAL VAC POLYVALENT 25 MCG/0.5ML IJ INJ: 0.5000 mL | INJECTION | INTRAMUSCULAR | Status: DC | PRN

## 2010-12-21 MED ORDER — POTASSIUM CHLORIDE 10 MEQ/50ML IV SOLN
10.0000 meq | INTRAVENOUS | Status: DC
  Administered 2010-12-21: 10 meq via INTRAVENOUS
  Filled 2010-12-21: qty 100
  Filled 2010-12-21: qty 50

## 2010-12-21 MED ORDER — SODIUM CHLORIDE 0.9 % IV BOLUS (SEPSIS)
500.0000 mL | Freq: Once | INTRAVENOUS | Status: AC
  Administered 2010-12-21: 500 mL via INTRAVENOUS

## 2010-12-21 MED ORDER — FUROSEMIDE 10 MG/ML IJ SOLN
40.0000 mg | Freq: Four times a day (QID) | INTRAMUSCULAR | Status: DC
  Administered 2010-12-21 (×2): 40 mg via INTRAVENOUS
  Filled 2010-12-21: qty 4

## 2010-12-21 MED ORDER — MAGNESIUM SULFATE 40 MG/ML IJ SOLN
2.0000 g | Freq: Once | INTRAMUSCULAR | Status: AC
  Administered 2010-12-21: 2 g via INTRAVENOUS
  Filled 2010-12-21: qty 50

## 2010-12-21 MED ORDER — POTASSIUM CHLORIDE 10 MEQ/100ML IV SOLN
10.0000 meq | INTRAVENOUS | Status: DC
  Administered 2010-12-21: 10 meq via INTRAVENOUS
  Filled 2010-12-21: qty 100

## 2010-12-21 MED ORDER — POTASSIUM CHLORIDE 10 MEQ/100ML IV SOLN: 10.0000 meq | INTRAVENOUS | Status: DC

## 2010-12-21 MED ORDER — INFLUENZA VIRUS VACC SPLIT PF IM SUSP: 0.5000 mL | INTRAMUSCULAR | Status: DC | PRN

## 2010-12-21 MED ORDER — SODIUM CHLORIDE 0.9 % IV BOLUS (SEPSIS)
1000.0000 mL | Freq: Once | INTRAVENOUS | Status: AC
  Administered 2010-12-21: 1000 mL via INTRAVENOUS

## 2010-12-21 MED ORDER — POTASSIUM CHLORIDE 10 MEQ/50ML IV SOLN
10.0000 meq | INTRAVENOUS | Status: AC
  Administered 2010-12-21 (×2): 10 meq via INTRAVENOUS

## 2010-12-21 MED ORDER — POTASSIUM CHLORIDE 10 MEQ/50ML IV SOLN
10.0000 meq | INTRAVENOUS | Status: AC
  Administered 2010-12-21 (×2): 10 meq via INTRAVENOUS
  Filled 2010-12-21: qty 100

## 2010-12-21 NOTE — Progress Notes (Signed)
eLink Physician-Brief Progress Note Patient Name: Harry James DOB: 03-Apr-1950 MRN: [id number]  Date of Service  12/21/2010   HPI/Events of Note   Date of Service  12/21/2010   HPI/Events of Note   Was bradycardic yesterday but was on precedex This am HR 100s of precedex Last few hours HR 130 - sinus on EKG - no fever. Did not respond to pain med per RN. On lasix and negative volue  eICU Interventions  Fluid bolus 500cc and reassess if tachycardia improved    eICU Interventions     Intervention Category Intermediate Interventions: Arrhythmia - evaluation and management  Khaya Theissen 12/21/2010, 6:15 PM

## 2010-12-21 NOTE — Plan of Care (Signed)
Problem: Phase II Progression Outcomes Goal: Time pt extubated/weaned off vent Outcome: Completed/Met Date Met:  12/21/10 1015

## 2010-12-21 NOTE — Progress Notes (Signed)
5 Days Post-Op  Subjective: Smear BM, weaning, more responsive  Objective: Vital signs in last 24 hours: Temp:  [97.4 F (36.3 C)-98.5 F (36.9 C)] 97.4 F (36.3 C) (12/06 0400) Pulse Rate:  [49-85] 56  (12/06 0729) Resp:  [19-30] 22  (12/06 0729) BP: (119-143)/(60-83) 135/73 mmHg (12/06 0729) SpO2:  [90 %-100 %] 99 % (12/06 0729) FiO2 (%):  [40 %] 40 % (12/06 0729) Weight:  [92.1 kg (203 lb 0.7 oz)] 203 lb 0.7 oz (92.1 kg) (12/06 0517) Last BM Date:  (PTA)  Intake/Output from previous day: 12/05 0701 - 12/06 0700 In: 2934 [I.V.:726; IV Piggyback:468; TPN:1740] Out: 7315 [Urine:7100; Emesis/NG output:120; Drains:95] Intake/Output this shift:    Resp: clear to auscultation bilaterally Cardio: regular rate and rhythm GI: Soft, skin VAC in place, some BS, drains serous Extremities: no edema Neuro: F/C briskly Lab Results:   Basename 12/21/10 0355 12/20/10 0330  WBC 10.4 9.6  HGB 9.6* 8.6*  HCT 30.1* 27.1*  PLT 285 248   BMET  Basename 12/21/10 0355 12/20/10 0330  NA 143 147*  K 2.9* 4.0  CL 103 116*  CO2 30 24  GLUCOSE 204* 137*  BUN 28* 21  CREATININE 1.28 1.24  CALCIUM 10.2 9.5   PT/INR No results found for this basename: LABPROT:2,INR:2 in the last 72 hours ABG  Basename 12/21/10 0430 12/20/10 0310  PHART 7.450 7.328*  HCO3 29.3* 22.7    Studies/Results: Dg Chest Port 1 View  12/20/2010  *RADIOLOGY REPORT*  Clinical Data: Endotracheal tube placement.  Evaluate support apparatus.  PORTABLE CHEST - 1 VIEW  Comparison: 12/19/2010.  Findings: The endotracheal tube, nasogastric tube and left subclavian central line are unchanged compared yesterday's exam. Pulmonary aeration is also unchanged with left greater than right airspace disease, left effusion and left greater than right atelectasis.  IMPRESSION:  1.  Stable support apparatus. 2.  Unchanged pulmonary aeration with left greater than right airspace disease, left effusion and left greater than right  basilar atelectasis.  Original Report Authenticated By: Dereck Ligas, M.D.    Anti-infectives: Anti-infectives     Start     Dose/Rate Route Frequency Ordered Stop   12/20/10 1200   vancomycin (VANCOCIN) 750 mg in sodium chloride 0.9 % 150 mL IVPB        750 mg 150 mL/hr over 60 Minutes Intravenous Every 12 hours 12/19/10 1722     12/17/10 1400   vancomycin (VANCOCIN) 1,250 mg in sodium chloride 0.9 % 250 mL IVPB  Status:  Discontinued        1,250 mg 166.7 mL/hr over 90 Minutes Intravenous Every 12 hours 12/17/10 1223 12/19/10 1722   12/17/10 0800   fluconazole (DIFLUCAN) IVPB 100 mg        100 mg 50 mL/hr over 60 Minutes Intravenous Every 24 hours 12/16/10 0334     12/16/10 0600   piperacillin-tazobactam (ZOSYN) IVPB 3.375 g        3.375 g 12.5 mL/hr over 240 Minutes Intravenous 3 times per day 12/16/10 0320     12/16/10 0500   fluconazole (DIFLUCAN) IVPB 200 mg        200 mg 100 mL/hr over 60 Minutes Intravenous  Once 12/16/10 0334 12/16/10 0613   12/15/10 2030   ampicillin-sulbactam (UNASYN) 1.5 g in sodium chloride 0.9 % 50 mL IVPB        1.5 g 100 mL/hr over 30 Minutes Intravenous  Once 12/15/10 2027 12/15/10 2127  Assessment/Plan: s/p Procedure(s): EXPLORATORY LAPAROTOMY S/P Patch repair perf DU POD #5, UGI once extubated VDRF and sepsis per CCM - appreciate their assist, weaning well on 5/5 now, hope to extubate soon ID Vanc/Zosyn/Diflucan, WBC now WNL FEN TNA for now, supplement K+ WOC RN for [facility] changes R Prox humerus FX sling per ortho - ortho tech to adjust sling VTE heparin  LOS: 6 days    Meleah Demeyer E 12/21/2010

## 2010-12-21 NOTE — Progress Notes (Signed)
eLink Physician-Brief Progress Note Patient Name: Harry James DOB: 1950-09-03 MRN: [id number]  Date of Service  12/21/2010   HPI/Events of Note  Still tacy bmet shows new onset pre-renal failure, bicarb is 30   eICU Interventions  Check bnp Dc lasix Give more fluid challenge   Intervention Category Major Interventions: Arrhythmia - evaluation and management Intermediate Interventions: Diagnostic test evaluation  Kylyn Sookram 12/21/2010, 9:16 PM

## 2010-12-21 NOTE — Progress Notes (Signed)
Name: Harry James MRN: [id number] DOB: 08-Sep-1950  ELECTRONIC ICU PHYSICIAN NOTE  Problem:  Hypokalemia  Intervention:  KCL x 4 runs IV  Christinia Gully 12/21/2010, 6:01 AM

## 2010-12-21 NOTE — Progress Notes (Signed)
eLink Physician-Brief Progress Note Patient Name: Harry James DOB: 1950/09/19 MRN: [id number]  Date of Service  12/21/2010   HPI/Events of Note   HR130 despite fluid bolus. RN say prn lopressor orders not dispensed yet.   eICU Interventions  Ok to give lopressor Check bmet, phos, mag, lactate   Intervention Category Major Interventions: Arrhythmia - evaluation and management  Antonela Freiman 12/21/2010, 8:24 PM

## 2010-12-21 NOTE — Progress Notes (Signed)
Name: Harry James MRN: [id number] DOB: [birth year]    LOS: 6  PCCM ADMISSION NOTE  History of Present Illness: This is a 60 year old male who has recently had a fractured right humeral neck. He has been taking NSAIDs.  He woke up with abdominal pain 11/30.  He presented to the emergency room at outside hospital and appeared to have a perforated viscus by CT scan.  Transfered to the ICU post op on vent.  PCCM is asked to assist with cc management.  Lines / Drains: ETT 12/1 >>> L [place] TLC 12/1>>> L radial a-line 12/1>>>12/3 Foley 12/1>>>12/3  Cultures: Blood 12/2>>>NTD Urine 12/2>>>NTD Sputum 12/2>>>NTD  Antibiotics: Unasyn (Per CCS) 12/1 >>12/1 Zosyn 12/1>>> Diflucan 12/2>>> Vancomycin 12/2>>>  Tests / Events: 12/1 to OR to repair ruptured viscus.  Vital Signs: Temp:  [97.4 F (36.3 C)-98.5 F (36.9 C)] 98.4 F (36.9 C) (12/06 0800) Pulse Rate:  [49-85] 56  (12/06 0729) Resp:  [20-30] 22  (12/06 0729) BP: (119-143)/(60-83) 135/73 mmHg (12/06 0729) SpO2:  [90 %-100 %] 99 % (12/06 0729) FiO2 (%):  [40 %] 40 % (12/06 0729) Weight:  [92.1 kg (203 lb 0.7 oz)] 203 lb 0.7 oz (92.1 kg) (12/06 0517)  Intake/Output Summary (Last 24 hours) at 12/21/10 0921 Last data filed at 12/21/10 0912  Gross per 24 hour  Intake 3256.5 ml  Output   7365 ml  Net -4108.5 ml   Physical Examination: General:  Intubated male. Neuro:  Intubated, moving all ext spontaneously.  But follows commands occasionally. HEENT:  [place]/AT, PERRL, EOM-spontaneous, -LAN and -thyromegally. Cardiovascular:  RRR, Nl S1/S2, -M/R/G. Lungs:  Coarse BS diffusely. Abdomen:  Soft, NT, ND and -BS. Skin:  Spider angiomas on upper chest.  BMET    Component Value Date/Time   NA 143 12/21/2010 0355   K 2.9* 12/21/2010 0355   CL 103 12/21/2010 0355   CO2 30 12/21/2010 0355   GLUCOSE 204* 12/21/2010 0355   BUN 28* 12/21/2010 0355   CREATININE 1.28 12/21/2010 0355   CALCIUM 10.2 12/21/2010 0355   GFRNONAA 59* 12/21/2010  0355   GFRAA 68* 12/21/2010 0355   CBC    Component Value Date/Time   WBC 10.4 12/21/2010 0355   RBC 3.22* 12/21/2010 0355   HGB 9.6* 12/21/2010 0355   HCT 30.1* 12/21/2010 0355   PLT 285 12/21/2010 0355   MCV 93.5 12/21/2010 0355   MCH 29.8 12/21/2010 0355   MCHC 31.9 12/21/2010 0355   RDW 13.6 12/21/2010 0355   LYMPHSABS 0.8 12/18/2010 0300   MONOABS 0.7 12/18/2010 0300   EOSABS 0.1 12/18/2010 0300   BASOSABS 0.0 12/18/2010 0300   ABG    Component Value Date/Time   PHART 7.450 12/21/2010 0430   PCO2ART 42.7 12/21/2010 0430   PO2ART 74.7* 12/21/2010 0430   HCO3 29.3* 12/21/2010 0430   TCO2 30.6 12/21/2010 0430   ACIDBASEDEF 2.4* 12/20/2010 0310   O2SAT 95.3 12/21/2010 0430    Ventilator settings: Vent Mode:  [-] CPAP FiO2 (%):  [40 %] 40 % Set Rate:  [22 bmp] 22 bmp Vt Set:  [500 mL] 500 mL PEEP:  [5 cmH20] 5 cmH20 Pressure Support:  [8 cmH20] 8 cmH20 Plateau Pressure:  [16 cmH20-23 cmH20] 16 cmH20  Imaging:  Reviewed.  Please refer to the Assessment and Plan section for relevant results.  Assessment and Plan: 1) Vent dep resp failure post laparotomy - now with pulmonary edema as well.  Concern for extubation at this point is mental status as  the patient does not follow commands appropriately. Plan: SBT today.  Diureses again today.  ? When will be able to use GI tract, will defer to surgery.  D/C precedex and PRN fentanyl.   2) AMS: Much improved now that he is on precedex and PRN fentanyl. Plan: Precedex drip and PRN fentanyl.  3) Hypotension: developing septic shock, on pressors due to perfed GI tract. Plan: Cultures negative.  D/C IVF.  Diflucan.  Vanc/zosyn.  Continue TPN.  4) DMII - ICU HG protocol.  5) Tachycardia - Scheduled beta blockers, sporadic episodes of bradycardia since precedex, will D/C today.  6) Peritonitis: Changed unasyn to zosyn/vanc/diflucan and WBC is improving with negative cultures, will keep on current abx for now.  Best practices /  Disposition: -->ICU status under CCS, PCCM consulting. -->full code. -->SCDs/[place] Hep for DVT Px -->Protonix for GI Px. -->ventilator bundle. -->TPN.  - Patient was a very difficult airway and is reaching for ETT.  Self extubation would be disastrous for him.  Will keep hands restrained.  The patient is critically ill with multiple organ systems failure and requires high complexity decision making for assessment and support, frequent evaluation and titration of therapies, application of advanced monitoring technologies and extensive interpretation of multiple databases. Critical Care Time devoted to patient care services described in this note is 35 minutes.  Jennet Maduro, M.D.

## 2010-12-21 NOTE — Procedures (Signed)
Extubation Procedure Note  Patient Details:   Name: Harry James DOB: 1950-10-25 MRN: [id number]   Airway Documentation:  AIRWAYS 7.5 mm (Active)  Secured at (cm) 21 cm 12/15/2010 12:00 AM     AIRWAYS 7.5 mm (Active)     Airway 7.5 mm (Active)  Secured at (cm) 23 cm 12/21/2010  7:29 AM  Measured From Lips 12/21/2010  7:29 AM  Secured Location Right 12/21/2010  7:29 AM  Secured By [employer] 12/21/2010  7:29 AM  Tube Holder Repositioned Yes 12/21/2010  7:29 AM  Cuff Pressure (cm H2O) 25 cm H2O 12/21/2010  3:50 AM    Evaluation  O2 sats: stable throughout Complications: No apparent complications Patient did tolerate procedure well. Bilateral Breath Sounds: Diminished;Clear Suctioning: Airway Yes  Cordella Register 12/21/2010, 10:16 AM

## 2010-12-21 NOTE — Progress Notes (Signed)
Nutrition Follow-up  Diet Order:  NPO  Re-estimated nutrition needs: Kcal: 1855 kcal Permissive underfeeding kcal goal per ASPEN guidelines (60-70%): 1115 - 1300 kcal Protein: 115 - 130 grams  Patient is receiving TPN with Clinimix E 5/15 @ 80 ml/hr.  Lipids (20% IVFE @ 10 ml/hr), multivitamins, and trace elements are provided 3 times weekly (MWF) due to national backorder.  Provides 1569 kcal and 96 grams protein daily (based on weekly average).  Meets 121% of maximum re-estimated permissive underfeeding kcal and 83% minimum estimated protein needs.  Meds: Scheduled Meds:   . antiseptic oral rinse  15 mL Mouth Rinse QID  . chlorhexidine  15 mL Mouth Rinse BID  . fluconazole (DIFLUCAN) IV  100 mg Intravenous Q24H  . furosemide  40 mg Intravenous Q6H  . heparin  5,000 Units Subcutaneous Q8H  . insulin aspart  0-15 Units Subcutaneous Q4H  . ipratropium  0.5 mg Nebulization Q6H  . levalbuterol  0.63 mg Nebulization Q6H  . magnesium sulfate IVPB  2 g Intravenous Once  . metolazone  5 mg Oral Daily  . metoprolol  10 mg Intravenous Q6H  . pantoprazole (PROTONIX) IV  40 mg Intravenous Q12H  . piperacillin-tazobactam (ZOSYN)  IV  3.375 g Intravenous Q8H  . potassium chloride  10 mEq Intravenous Q1 Hr x 4  . sodium chloride  1,000 mL Intravenous Once  . sodium chloride  10 mL Intracatheter Q12H  . vancomycin  750 mg Intravenous Q12H  . DISCONTD: potassium chloride  10 mEq Intravenous Q1 Hr x 4  . DISCONTD: potassium chloride  10 mEq Intravenous Q1 Hr x 3   Continuous Infusions:   . dexmedetomidine (PRECEDEX) IV infusion for high rates 0.5 mcg/kg/hr (12/21/10 0700)  . dextrose 5 mL (12/21/10 0906)  . dextrose Stopped (12/21/10 0545)  . TPN (CLINIMIX) +/- additives 80 mL/hr at 12/21/10 0700   And  . fat emulsion 250 mL (12/20/10 1735)  . TPN (CLINIMIX) +/- additives 60 mL/hr at 12/19/10 1751  . TPN (CLINIMIX) +/- additives     PRN Meds:.fentaNYL, influenza  inactive virus vaccine,  ondansetron, pneumococcal 23 valent vaccine, sodium chloride  Labs:  CMP     Component Value Date/Time   NA 143 12/21/2010 0355   K 2.9* 12/21/2010 0355   CL 103 12/21/2010 0355   CO2 30 12/21/2010 0355   GLUCOSE 204* 12/21/2010 0355   BUN 28* 12/21/2010 0355   CREATININE 1.28 12/21/2010 0355   CALCIUM 10.2 12/21/2010 0355   PROT 6.1 12/21/2010 0355   ALBUMIN 2.2* 12/21/2010 0355   AST 18 12/21/2010 0355   ALT 29 12/21/2010 0355   ALKPHOS 256* 12/21/2010 0355   BILITOT 1.3* 12/21/2010 0355   GFRNONAA 59* 12/21/2010 0355   GFRAA 68* 12/21/2010 0355     Intake/Output Summary (Last 24 hours) at 12/21/10 0926 Last data filed at 12/21/10 0912  Gross per 24 hour  Intake 3256.5 ml  Output   7365 ml  Net -4108.5 ml    Weight Status:  92.1 kg (down 8 kg x 4 days)  Nutrition Dx:  Inadequate oral intake - persists.  New Goal:   TPN to meet >90% of estimated protein needs, and 60-70% of kcal needs per ASPEN permissive underfeeding guidelines.  Intervention:   1. TPN per pharmacy 2. RD to follow nutrition care plan  Monitor:   TPN adequacy, weights, labs, extubation   Harry James, Harry James Pager #:  [phone]

## 2010-12-21 NOTE — Progress Notes (Signed)
PARENTERAL NUTRITION CONSULT NOTE - FOLLOW UP  Pharmacy Consult for TPN Indication: perforated DU s/p patch repair  No Known Allergies  Patient Measurements: Height: 5\' 5"  (165.1 cm) Weight: 203 lb 0.7 oz (92.1 kg) IBW/kg (Calculated) : 61.5   Vital Signs: Temp: 98.4 F (36.9 C) (12/06 0800) Temp src: Oral (12/06 0800) BP: 135/73 mmHg (12/06 0729) Pulse Rate: 56  (12/06 0729) Intake/Output from previous day: 12/05 0701 - 12/06 0700 In: 3126 [I.V.:748; IV Piggyback:518; TPN:1860] Out: [id number] [Urine:7100; Emesis/NG output:120; Drains:95] Intake/Output from this shift: Total I/O In: 364.5 [I.V.:34.5; IV Piggyback:150; TPN:180] Out: 350 [Urine:350]+ 426 today, -3580 yesterday  Labs:  [facility] 12/21/10 0355 12/20/10 0330 12/19/10 0405  WBC 10.4 9.6 11.8*  HGB 9.6* 8.6* 8.2*  HCT 30.1* 27.1* 26.4*  PLT 285 248 266  APTT -- -- --  INR -- -- --     Basename 12/21/10 0355 12/20/10 0330 12/19/10 0405  NA 143 147* 149*  K 2.9* 4.0 4.3  CL 103 116* 118*  CO2 30 24 23   GLUCOSE 204* 137* 137*  BUN 28* 21 17  CREATININE 1.28 1.24 1.34  LABCREA -- -- --  CREAT24HRUR -- -- --  CALCIUM 10.2 9.5 9.3  MG 1.5 2.3 2.2  PHOS 3.5 3.3 2.7  PROT 6.1 -- 5.3*  ALBUMIN 2.2* -- 1.9*  AST 18 -- 16  ALT 29 -- 17  ALKPHOS 256* -- 89  BILITOT 1.3* -- 2.2*  BILIDIR -- -- --  IBILI -- -- --  PREALBUMIN -- -- --  CHOLHDL -- -- --  CHOL -- -- --   Estimated Creatinine Clearance: 63.2 ml/min (by C-G formula based on Cr of 1.28).    Basename 12/21/10 0758 12/21/10 0404 12/20/10 2342  GLUCAP 185* 191* 158*    Medications:  Scheduled:     . antiseptic oral rinse  15 mL Mouth Rinse QID  . chlorhexidine  15 mL Mouth Rinse BID  . fluconazole (DIFLUCAN) IV  100 mg Intravenous Q24H  . furosemide  40 mg Intravenous Q6H  . heparin  5,000 Units Subcutaneous Q8H  . insulin aspart  0-15 Units Subcutaneous Q4H  . ipratropium  0.5 mg Nebulization Q6H  . levalbuterol  0.63 mg Nebulization  Q6H  . magnesium sulfate IVPB  2 g Intravenous Once  . metolazone  5 mg Oral Daily  . metoprolol  10 mg Intravenous Q6H  . pantoprazole (PROTONIX) IV  40 mg Intravenous Q12H  . piperacillin-tazobactam (ZOSYN)  IV  3.375 g Intravenous Q8H  . potassium chloride  10 mEq Intravenous Q1 Hr x 4  . sodium chloride  1,000 mL Intravenous Once  . sodium chloride  10 mL Intracatheter Q12H  . vancomycin  750 mg Intravenous Q12H  . DISCONTD: potassium chloride  10 mEq Intravenous Q1 Hr x 4  . DISCONTD: potassium chloride  10 mEq Intravenous Q1 Hr x 3   Infusions:     . dexmedetomidine (PRECEDEX) IV infusion for high rates 0.5 mcg/kg/hr (12/21/10 0700)  . dextrose 5 mL (12/21/10 0906)  . dextrose Stopped (12/21/10 0545)  . TPN (CLINIMIX) +/- additives 80 mL/hr at 12/21/10 0700   And  . fat emulsion 250 mL (12/20/10 1735)  . TPN (CLINIMIX) +/- additives 60 mL/hr at 12/19/10 1751  . TPN (CLINIMIX) +/- additives      Insulin Requirements in the past 24 hours:  8 units SSI  Current Nutrition:  NPO  Assessment: 21 YOM s/p Ex-lap and patch repair of perforated DU  on 12/1. Appears was taking NSAIDs for pain control following LE fracture.  He is currently intubated and sedated.   E: K 2.9, Na 143, phos 3.5, mag 1.5      Corrected Ca 11.64  Ca X phos=39.9 Nutritional Goals: protein 115-130 gm. Permissive underfeeding goal 1350-1550 kcal   Plan:  1. TPN at 80 ml/hr.  Add insulin to TPN. 2. Due to national backorder, lipids, MVI, and trace elements to be given MWF.  3. Magnesium 2 gm bolus today. 4.  f/u labs in am   Edilia Ghuman Poteet 12/21/2010,9:17 AM

## 2010-12-22 ENCOUNTER — Inpatient Hospital Stay (HOSPITAL_COMMUNITY): Payer: BC Managed Care – PPO

## 2010-12-22 LAB — MAGNESIUM: Magnesium: 1.8 mg/dL (ref 1.5–2.5)

## 2010-12-22 LAB — BASIC METABOLIC PANEL
CO2: 29 mEq/L (ref 19–32)
Chloride: 103 mEq/L (ref 96–112)
Creatinine, Ser: 1.36 mg/dL — ABNORMAL HIGH (ref 0.50–1.35)
GFR calc Af Amer: 63 mL/min — ABNORMAL LOW (ref >90)
Potassium: 3.4 mEq/L — ABNORMAL LOW (ref 3.5–5.1)
Sodium: 142 mEq/L (ref 135–145)

## 2010-12-22 LAB — GLUCOSE, CAPILLARY
Glucose-Capillary: 215 mg/dL — ABNORMAL HIGH (ref 70–99)
Glucose-Capillary: 196 mg/dL — ABNORMAL HIGH (ref 70–99)

## 2010-12-22 LAB — CBC
MCV: 92.6 fL (ref 78.0–100.0)
Platelets: 331 10*3/uL (ref 150–400)
RBC: 3.25 MIL/uL — ABNORMAL LOW (ref 4.22–5.81)
RDW: 13.7 % (ref 11.5–15.5)
WBC: 12.3 10*3/uL — ABNORMAL HIGH (ref 4.0–10.5)

## 2010-12-22 LAB — PHOSPHORUS: Phosphorus: 3.3 mg/dL (ref 2.3–4.6)

## 2010-12-22 MED ORDER — POTASSIUM CHLORIDE 10 MEQ/50ML IV SOLN
10.0000 meq | INTRAVENOUS | Status: AC
  Administered 2010-12-22 (×2): 10 meq via INTRAVENOUS

## 2010-12-22 MED ORDER — IOHEXOL 300 MG/ML  SOLN
150.0000 mL | Freq: Once | INTRAMUSCULAR | Status: AC | PRN
  Administered 2010-12-22: 150 mL via ORAL

## 2010-12-22 MED ORDER — TRACE MINERALS CR-CU-MN-SE-ZN 10-1000-500-60 MCG/ML IV SOLN
INTRAVENOUS | Status: AC
  Administered 2010-12-22: 18:00:00 via INTRAVENOUS
  Filled 2010-12-22: qty 2000

## 2010-12-22 MED ORDER — MAGNESIUM SULFATE IN D5W 10-5 MG/ML-% IV SOLN
1.0000 g | Freq: Once | INTRAVENOUS | Status: AC
  Administered 2010-12-22: 1 g via INTRAVENOUS
  Filled 2010-12-22: qty 100

## 2010-12-22 MED ORDER — FAT EMULSION 20 % IV EMUL
250.0000 mL | INTRAVENOUS | Status: AC
  Administered 2010-12-22: 250 mL via INTRAVENOUS
  Filled 2010-12-22: qty 250

## 2010-12-22 MED ORDER — POTASSIUM CHLORIDE 10 MEQ/50ML IV SOLN
INTRAVENOUS | Status: AC
  Administered 2010-12-22: 10 meq via INTRAVENOUS
  Filled 2010-12-22: qty 100

## 2010-12-22 NOTE — Progress Notes (Addendum)
6 Days Post-Op  Subjective: Tolerated extubation, small BM this AM  Objective: Vital signs in last 24 hours: Temp:  [97.9 F (36.6 C)-98.4 F (36.9 C)] 98.4 F (36.9 C) (12/07 0400) Pulse Rate:  [79-140] 100  (12/07 0700) Resp:  [19-29] 25  (12/07 0700) BP: (113-145)/(52-82) 133/52 mmHg (12/07 0700) SpO2:  [92 %-97 %] 96 % (12/07 0741) Weight:  [93.2 kg (205 lb 7.5 oz)] 205 lb 7.5 oz (93.2 kg) (12/07 0500) Last BM Date:  (PTA)  Intake/Output from previous day: 12/06 0701 - 12/07 0700 In: 4795.5 [I.V.:445.5; IV Piggyback:2330; TPN:2020] Out: [id number] [Urine:4515; Drains:26] Intake/Output this shift: Total I/O In: 80 [TPN:80] Out: -   General appearance: alert and cooperative Resp: clear to auscultation bilaterally Cardio: regular rate and rhythm GI: Skin VAC in place, +BS, Soft, JP output minimal  Lab Results:   Basename 12/22/10 0530 12/21/10 0355  WBC 12.3* 10.4  HGB 9.7* 9.6*  HCT 30.1* 30.1*  PLT 331 285   BMET  Basename 12/22/10 0530 12/21/10 2034  NA 142 142  K 3.4* 3.5  CL 103 102  CO2 29 30  GLUCOSE 203* 204*  BUN 29* 30*  CREATININE 1.36* 1.40*  CALCIUM 9.2 9.8   PT/INR No results found for this basename: LABPROT:2,INR:2 in the last 72 hours ABG  Basename 12/21/10 0430 12/20/10 0310  PHART 7.450 7.328*  HCO3 29.3* 22.7    Studies/Results: Dg Chest Port 1 View  12/21/2010  *RADIOLOGY REPORT*  Clinical Data: Evaluate endotracheal tube position, shortness of breath  PORTABLE CHEST - 1 VIEW  Comparison: The portable chest x-ray of 12/20/2010  Findings: The tip of the endotracheal tube is approximately 6.8 cm above the carina.  Left central venous line tip is in the upper SVC.  There is basilar atelectasis remaining left greater than right with probable left effusion.  Mild cardiomegaly is stable.  IMPRESSION:  1.  Little change in left basilar opacity consistent with atelectasis and effusion.  Pneumonia cannot be excluded. 2.  Tip of endotracheal tube 6.8  cm above the carina.  Original Report Authenticated By: Joretta Bachelor, M.D.    Anti-infectives: Anti-infectives     Start     Dose/Rate Route Frequency Ordered Stop   12/20/10 1200   vancomycin (VANCOCIN) 750 mg in sodium chloride 0.9 % 150 mL IVPB        750 mg 150 mL/hr over 60 Minutes Intravenous Every 12 hours 12/19/10 1722     12/17/10 1400   vancomycin (VANCOCIN) 1,250 mg in sodium chloride 0.9 % 250 mL IVPB  Status:  Discontinued        1,250 mg 166.7 mL/hr over 90 Minutes Intravenous Every 12 hours 12/17/10 1223 12/19/10 1722   12/17/10 0800   fluconazole (DIFLUCAN) IVPB 100 mg        100 mg 50 mL/hr over 60 Minutes Intravenous Every 24 hours 12/16/10 0334     12/16/10 0600  piperacillin-tazobactam (ZOSYN) IVPB 3.375 g       3.375 g 12.5 mL/hr over 240 Minutes Intravenous 3 times per day 12/16/10 0320     12/16/10 0500   fluconazole (DIFLUCAN) IVPB 200 mg        200 mg 100 mL/hr over 60 Minutes Intravenous  Once 12/16/10 0334 12/16/10 0613   12/15/10 2030   ampicillin-sulbactam (UNASYN) 1.5 g in sodium chloride 0.9 % 50 mL IVPB        1.5 g 100 mL/hr over 30 Minutes Intravenous  Once 12/15/10  2027 12/15/10 2127          Assessment/Plan: s/p Procedure(s): EXPLORATORY LAPAROTOMY S/P Patch repair perf DU POD #6, UGI today Resp - doing well post-extubation, appreciate CCM assist ID Vanc/Zosyn/Diflucan, WBC mild elev FEN TNA for now, supplement K+ WOC RN for [facility] changes R Prox humerus FX sling per ortho - ortho tech to adjust sling VTE heparin Decond- PT/OT  LOS: 7 days    Harry James 12/22/2010

## 2010-12-22 NOTE — Progress Notes (Signed)
Name: Harry James MRN: [id number] DOB: 1950/05/06    LOS: 7  PCCM ADMISSION NOTE  History of Present Illness: This is a 60 year old male who has recently had a fractured right humeral neck. He has been taking NSAIDs.  He woke up with abdominal pain 11/30.  He presented to the emergency room at outside hospital and appeared to have a perforated viscus by CT scan.  Transfered to the ICU post op on vent.  PCCM is asked to assist with cc management.  Lines / Drains: ETT 12/1 >>> L [place] TLC 12/1>>> L radial a-line 12/1>>>12/3 Foley 12/1>>>12/3  Cultures: Blood 12/2>>>NTD Urine 12/2>>>NTD Sputum 12/2>>>NTD  Antibiotics: Unasyn (Per CCS) 12/1 >>12/1 Zosyn 12/1>>> Diflucan 12/2>>>12/7 Vancomycin 12/2>>>12/7  Tests / Events: 12/1 to OR to repair ruptured viscus.  Vital Signs: Temp:  [97.3 F (36.3 C)-98.4 F (36.9 C)] 97.3 F (36.3 C) (12/07 0800) Pulse Rate:  [87-140] 108  (12/07 0900) Resp:  [19-31] 21  (12/07 0900) BP: (113-145)/(52-108) 134/108 mmHg (12/07 0900) SpO2:  [92 %-97 %] 95 % (12/07 0900) Weight:  [93.2 kg (205 lb 7.5 oz)] 205 lb 7.5 oz (93.2 kg) (12/07 0500)  Intake/Output Summary (Last 24 hours) at 12/22/10 0929 Last data filed at 12/22/10 0800  Gross per 24 hour  Intake 4511.01 ml  Output   3966 ml  Net 545.01 ml   Physical Examination: General:  Intubated male. Neuro:  Intubated, moving all ext spontaneously.  But follows commands occasionally. HEENT:  [place]/AT, PERRL, EOM-spontaneous, -LAN and -thyromegally. Cardiovascular:  RRR, Nl S1/S2, -M/R/G. Lungs:  Coarse BS diffusely. Abdomen:  Soft, NT, ND and -BS. Skin:  Spider angiomas on upper chest.  BMET    Component Value Date/Time   NA 142 12/22/2010 0530   K 3.4* 12/22/2010 0530   CL 103 12/22/2010 0530   CO2 29 12/22/2010 0530   GLUCOSE 203* 12/22/2010 0530   BUN 29* 12/22/2010 0530   CREATININE 1.36* 12/22/2010 0530   CALCIUM 9.2 12/22/2010 0530   GFRNONAA 55* 12/22/2010 0530   GFRAA 63* 12/22/2010  0530   CBC    Component Value Date/Time   WBC 12.3* 12/22/2010 0530   RBC 3.25* 12/22/2010 0530   HGB 9.7* 12/22/2010 0530   HCT 30.1* 12/22/2010 0530   PLT 331 12/22/2010 0530   MCV 92.6 12/22/2010 0530   MCH 29.8 12/22/2010 0530   MCHC 32.2 12/22/2010 0530   RDW 13.7 12/22/2010 0530   LYMPHSABS 0.8 12/18/2010 0300   MONOABS 0.7 12/18/2010 0300   EOSABS 0.1 12/18/2010 0300   BASOSABS 0.0 12/18/2010 0300   ABG    Component Value Date/Time   PHART 7.450 12/21/2010 0430   PCO2ART 42.7 12/21/2010 0430   PO2ART 74.7* 12/21/2010 0430   HCO3 29.3* 12/21/2010 0430   TCO2 30.6 12/21/2010 0430   ACIDBASEDEF 2.4* 12/20/2010 0310   O2SAT 95.3 12/21/2010 0430    Ventilator settings:   Imaging:  Reviewed.  Please refer to the Assessment and Plan section for relevant results.  Assessment and Plan: 1) Vent dep resp failure post laparotomy - now with pulmonary edema as well.  Concern for extubation at this point is mental status as the patient does not follow commands appropriately. Plan: Titrate O2 for sat of 88-92%  IS per RT protocol.  Flutter valve per RT protocol.   2) AMS: Much improved now that he is on precedex and PRN fentanyl. Plan: D/C sedation.  PRN fentanyl.  3) Hypertension: developing septic shock, on pressors due  to perfed GI tract. Plan: Cultures negative.  D/C IVF.  D/C diflucan.  D/C vanc.  Continue zosyn.  4) DMII - ICU HG protocol.  5) Tachycardia - Continue current dose of beta blockers, once able to take PO will change to PO lopressor.  6) Peritonitis: Changed unasyn to zosyn/vanc/diflucan and WBC is improving with negative cultures, will keep on current abx for now.  Extubated and doing well, PCCM signing off, please call back if needed.  Jennet Maduro, M.D. [phone]

## 2010-12-22 NOTE — Progress Notes (Signed)
PARENTERAL NUTRITION CONSULT NOTE - FOLLOW UP  Pharmacy Consult for TPN Indication: perforated DU s/p patch repair  No Known Allergies  Patient Measurements: Height: 5\' 5"  (165.1 cm) Weight: 205 lb 7.5 oz (93.2 kg) IBW/kg (Calculated) : 61.5   Vital Signs: Temp: 97.3 F (36.3 C) (12/07 0800) Temp src: Oral (12/07 0800) BP: 134/108 mmHg (12/07 0900) Pulse Rate: 108  (12/07 0900) Intake/Output from previous day: 12/06 0701 - 12/07 0700 In: 4795.5 [I.V.:445.5; IV Piggyback:2330; TPN:2020] Out: [id number] [Urine:4515; Drains:26] Intake/Output from this shift: Total I/O In: 80 [TPN:80] Out: - + 426 today, -3580 yesterday  Labs:  [facility] 12/22/10 0530 12/21/10 0355 12/20/10 0330  WBC 12.3* 10.4 9.6  HGB 9.7* 9.6* 8.6*  HCT 30.1* 30.1* 27.1*  PLT 331 285 248  APTT -- -- --  INR -- -- --     Basename 12/22/10 0530 12/21/10 2034 12/21/10 0355  NA 142 142 143  K 3.4* 3.5 2.9*  CL 103 102 103  CO2 29 30 30   GLUCOSE 203* 204* 204*  BUN 29* 30* 28*  CREATININE 1.36* 1.40* 1.28  LABCREA -- -- --  CREAT24HRUR -- -- --  CALCIUM 9.2 9.8 10.2  MG 1.8 1.9 1.5  PHOS 3.3 3.4 3.5  PROT -- -- 6.1  ALBUMIN -- -- 2.2*  AST -- -- 18  ALT -- -- 29  ALKPHOS -- -- 256*  BILITOT -- -- 1.3*  BILIDIR -- -- --  IBILI -- -- --  PREALBUMIN -- -- --  CHOLHDL -- -- --  CHOL -- -- --   Estimated Creatinine Clearance: 59.9 ml/min (by C-G formula based on Cr of 1.36).    Basename 12/22/10 0815 12/22/10 0335 12/21/10 2346  GLUCAP 167* 183* 179*    Insulin Requirements in the past 24 hours:  18 units SSI  Current Nutrition:  NPO  Assessment: 14 YOM s/p Ex-lap and patch repair of perforated DU on 12/1. Appears was taking NSAIDs for pain control following LE fracture. K 3.4 to get 2 runs K per CCS, mag 1.8 after 2gm bolus yesterdeay. Ca 9.2 Nutritional Goals: protein 115-130 gm. Permissive underfeeding goal 1115 - 1300 kcal Plan:  1. TPN at 80 ml/hr.  increase insulin inTPN. 2. Due  to national backorder, lipids, MVI, and trace elements to be given MWF.  3. Magnesium 1 gm bolus today. 4.  f/u labs in am   Leodis Sias T 12/22/2010,9:26 AM

## 2010-12-22 NOTE — Progress Notes (Signed)
   CARE MANAGEMENT NOTE 12/22/2010  Patient:  Harry James, Harry James   Account Number:  [id number]  Date Initiated:  12/19/2010  Documentation initiated by:  Sandi Mariscal  Subjective/Objective Assessment:   perforated duodenal ulcer requiring surgical repair. On vent in ICU with significant agitation noted.     Action/Plan:   Await improvement to determine pt's needs at d/c. Home HH vs SNF is best estimate now.   Anticipated DC Date:  12/28/2010   Anticipated DC Plan:  [place]  CM consult               Status of service:  In process, will continue to follow  Discharge Disposition:  TBD  Per UR Regulation:  Reviewed for med. necessity/level of care/duration of stay  Comments:  12/22/2010 Sandi Mariscal, RN BSN CCM 1429--Pt remains in ICU. Extubated today. Await PT/OT evals to determine recommendations for discharge planning.

## 2010-12-23 LAB — CBC
HCT: 30.8 % — ABNORMAL LOW (ref 39.0–52.0)
Hemoglobin: 9.9 g/dL — ABNORMAL LOW (ref 13.0–17.0)
MCV: 93.3 fL (ref 78.0–100.0)
RBC: 3.3 MIL/uL — ABNORMAL LOW (ref 4.22–5.81)
WBC: 15.2 10*3/uL — ABNORMAL HIGH (ref 4.0–10.5)

## 2010-12-23 LAB — CULTURE, BLOOD (ROUTINE X 2)
Culture  Setup Time: 201212021805
Culture: NO GROWTH

## 2010-12-23 LAB — MAGNESIUM: Magnesium: 2.1 mg/dL (ref 1.5–2.5)

## 2010-12-23 LAB — BASIC METABOLIC PANEL
BUN: 24 mg/dL — ABNORMAL HIGH (ref 6–23)
CO2: 28 mEq/L (ref 19–32)
Chloride: 102 mEq/L (ref 96–112)
Creatinine, Ser: 1.21 mg/dL (ref 0.50–1.35)

## 2010-12-23 LAB — GLUCOSE, CAPILLARY: Glucose-Capillary: 201 mg/dL — ABNORMAL HIGH (ref 70–99)

## 2010-12-23 MED ORDER — ONDANSETRON HCL 4 MG/2ML IJ SOLN: 4.0000 mg | Freq: Four times a day (QID) | INTRAMUSCULAR | Status: DC | PRN

## 2010-12-23 MED ORDER — NALOXONE HCL 0.4 MG/ML IJ SOLN: 0.4000 mg | INTRAMUSCULAR | Status: DC | PRN

## 2010-12-23 MED ORDER — INSULIN REGULAR HUMAN 100 UNIT/ML IJ SOLN
INTRAVENOUS | Status: AC
  Administered 2010-12-23: 18:00:00 via INTRAVENOUS
  Filled 2010-12-23: qty 2000

## 2010-12-23 MED ORDER — INFLUENZA VIRUS VACC SPLIT PF IM SUSP: 0.5000 mL | INTRAMUSCULAR | Status: DC | PRN

## 2010-12-23 MED ORDER — PIPERACILLIN-TAZOBACTAM 3.375 G IVPB
3.3750 g | Freq: Three times a day (TID) | INTRAVENOUS | Status: DC
  Administered 2010-12-23 – 2010-12-28 (×14): 3.375 g via INTRAVENOUS
  Filled 2010-12-23 (×16): qty 50

## 2010-12-23 MED ORDER — DIPHENHYDRAMINE HCL 12.5 MG/5ML PO ELIX
12.5000 mg | ORAL_SOLUTION | Freq: Four times a day (QID) | ORAL | Status: DC | PRN
  Filled 2010-12-23: qty 5

## 2010-12-23 MED ORDER — DIPHENHYDRAMINE HCL 50 MG/ML IJ SOLN
12.5000 mg | Freq: Four times a day (QID) | INTRAMUSCULAR | Status: DC | PRN
  Filled 2010-12-23: qty 1

## 2010-12-23 MED ORDER — MORPHINE SULFATE (PF) 1 MG/ML IV SOLN
INTRAVENOUS | Status: DC
  Administered 2010-12-23: 23:00:00 via INTRAVENOUS
  Administered 2010-12-23: 2 mg via INTRAVENOUS
  Administered 2010-12-24: 1 mg via INTRAVENOUS
  Administered 2010-12-24: 2 mg via INTRAVENOUS
  Administered 2010-12-25: 3 mg via INTRAVENOUS
  Administered 2010-12-25: 1 mg via INTRAVENOUS
  Administered 2010-12-25: 5.6 mg via INTRAVENOUS
  Administered 2010-12-26: 3 mg via INTRAVENOUS
  Administered 2010-12-26: 3.88 mg via INTRAVENOUS
  Administered 2010-12-26: 2 mg via INTRAVENOUS
  Filled 2010-12-23: qty 25

## 2010-12-23 MED ORDER — SODIUM CHLORIDE 0.9 % IJ SOLN: 9.0000 mL | INTRAMUSCULAR | Status: DC | PRN

## 2010-12-23 MED ORDER — PNEUMOCOCCAL VAC POLYVALENT 25 MCG/0.5ML IJ INJ: 0.5000 mL | INJECTION | INTRAMUSCULAR | Status: DC | PRN

## 2010-12-23 NOTE — Progress Notes (Signed)
PT Cancellation Note Attempted to see patient this pm.  Patient politely requests to defer PT eval until tomorrow am.  States he has been "up and down all day".  Will return in am for PT eval. Carita Pian. Sanjuana Kava, [place] Pager [phone]

## 2010-12-23 NOTE — Progress Notes (Signed)
PARENTERAL NUTRITION CONSULT NOTE - FOLLOW UP  Pharmacy Consult for TPN Indication: perforated DU s/p patch repair  No Known Allergies  Patient Measurements: Height: 5\' 5"  (165.1 cm) Weight: 204 lb 5.9 oz (92.7 kg) IBW/kg (Calculated) : 61.5   Vital Signs: Temp: 98.6 F (37 C) (12/08 0400) Temp src: Oral (12/08 0400) BP: 149/74 mmHg (12/08 0600) Pulse Rate: 99  (12/07 2000) Intake/Output from previous day: 12/07 0701 - 12/08 0700 In: 2145 [P.O.:130; I.V.:280; IV Piggyback:135; TPN:1600] Out: 1115 [Urine:1100; Drains:15] Intake/Output from this shift:  + 426 today, -3580 yesterday  Labs:  [facility] 12/23/10 0530 12/22/10 0530 12/21/10 0355  WBC 15.2* 12.3* 10.4  HGB 9.9* 9.7* 9.6*  HCT 30.8* 30.1* 30.1*  PLT 363 331 285  APTT -- -- --  INR -- -- --     Basename 12/23/10 0530 12/22/10 0530 12/21/10 2034 12/21/10 0355  NA 138 142 142 --  K 3.8 3.4* 3.5 --  CL 102 103 102 --  CO2 28 29 30  --  GLUCOSE 212* 203* 204* --  BUN 24* 29* 30* --  CREATININE 1.21 1.36* 1.40* --  LABCREA -- -- -- --  CREAT24HRUR -- -- -- --  CALCIUM 9.3 9.2 9.8 --  MG 2.1 1.8 1.9 --  PHOS 3.0 3.3 3.4 --  PROT -- -- -- 6.1  ALBUMIN -- -- -- 2.2*  AST -- -- -- 18  ALT -- -- -- 29  ALKPHOS -- -- -- 256*  BILITOT -- -- -- 1.3*  BILIDIR -- -- -- --  IBILI -- -- -- --  PREALBUMIN -- -- -- --  CHOLHDL -- -- -- --  CHOL -- -- -- --   Estimated Creatinine Clearance: 67.1 ml/min (by C-G formula based on Cr of 1.21).    Basename 12/23/10 0314 12/22/10 2321 12/22/10 1943  GLUCAP 173* 196* 215*    Insulin Requirements in the past 24 hours:  11 units SSI  Current Nutrition:  Clear liquids  Assessment: 29 YOM s/p Ex-lap and patch repair of perforated DU on 12/1. Appears was taking NSAIDs for pain control following LE fracture. K 3.8, mag 2.1. Ca 9.3 Nutritional Goals: protein 115-130 gm. Permissive underfeeding goal 1115 - 1300 kcal Plan:  1. TPN at 80 ml/hr.  increase insulin  inTPN. 2. Due to national backorder, lipids, MVI, and trace elements to be given MWF.  3.  f/u labs in am   Harry James 12/23/2010,7:27 AM

## 2010-12-23 NOTE — Progress Notes (Signed)
ANTIBIOTIC CONSULT NOTE - Follow-Up  Pharmacy Consult:  Vancomycin/Zosyn/Fluconazole Indication: Perforated ulcer repair  No Known Allergies  Patient Measurements: Height: 5\' 5"  (165.1 cm) Weight: 204 lb 5.9 oz (92.7 kg) IBW/kg (Calculated) : 61.5    Vital Signs: Temp: 98 F (36.7 C) (12/08 0700) Temp src: Oral (12/08 0700) BP: 148/70 mmHg (12/08 0800) Pulse Rate: 89  (12/08 0734) Intake/Output from previous day: 12/07 0701 - 12/08 0700 In: 2627.5 [P.O.:130; I.V.:300; IV Piggyback:147.5; TPN:2050] Out: 1465 [Urine:1450; Drains:15] Intake/Output from this shift: Total I/O In: 122.5 [I.V.:20; IV Piggyback:12.5; TPN:90] Out: -   Labs:  Basename 12/23/10 0530 12/22/10 0530 12/21/10 2034 12/21/10 0355  WBC 15.2* 12.3* -- 10.4  HGB 9.9* 9.7* -- 9.6*  PLT 363 331 -- 285  LABCREA -- -- -- --  CREATININE 1.21 1.36* 1.40* --   Estimated Creatinine Clearance: 67.1 ml/min (by C-G formula based on Cr of 1.21). No results found for this basename: VANCOTROUGH:2,VANCOPEAK:2,VANCORANDOM:2,GENTTROUGH:2,GENTPEAK:2,GENTRANDOM:2,TOBRATROUGH:2,TOBRAPEAK:2,TOBRARND:2,AMIKACINPEAK:2,AMIKACINTROU:2,AMIKACIN:2, in the last 72 hours   Microbiology: Recent Results (from the past 720 hour(s))  MRSA PCR SCREENING     Status: Normal   Collection Time   12/16/10  3:09 AM      Component Value Range Status Comment   MRSA by PCR NEGATIVE  NEGATIVE  Final   URINE CULTURE     Status: Normal   Collection Time   12/17/10 10:11 AM      Component Value Range Status Comment   Specimen Description URINE, CLEAN CATCH   Final    Special Requests NONE   Final    Setup Time [id number]   Final    Colony Count NO GROWTH   Final    Culture NO GROWTH   Final    Report Status 12/18/2010 FINAL   Final   CULTURE, BLOOD (ROUTINE X 2)     Status: Normal (Preliminary result)   Collection Time   12/17/10 10:20 AM      Component Value Range Status Comment   Specimen Description BLOOD LEFT FOOT   Final    Special  Requests BOTTLES DRAWN AEROBIC ONLY 5CC   Final    Setup Time [id number]   Final    Culture     Final    Value:        BLOOD CULTURE RECEIVED NO GROWTH TO DATE CULTURE WILL BE HELD FOR 5 DAYS BEFORE ISSUING A FINAL NEGATIVE REPORT   Report Status PENDING   Incomplete   CULTURE, BLOOD (ROUTINE X 2)     Status: Normal (Preliminary result)   Collection Time   12/17/10 10:40 AM      Component Value Range Status Comment   Specimen Description BLOOD RIGHT FOOT   Final    Special Requests BOTTLES DRAWN AEROBIC ONLY 5CC   Final    Setup Time [id number]   Final    Culture     Final    Value:        BLOOD CULTURE RECEIVED NO GROWTH TO DATE CULTURE WILL BE HELD FOR 5 DAYS BEFORE ISSUING A FINAL NEGATIVE REPORT   Report Status PENDING   Incomplete   CULTURE, RESPIRATORY     Status: Normal   Collection Time   12/17/10 11:11 AM      Component Value Range Status Comment   Specimen Description TRACHEAL ASPIRATE   Final    Special Requests NONE   Final    Gram Stain     Final    Value: ABUNDANT WBC PRESENT,BOTH PMN AND  MONONUCLEAR     RARE SQUAMOUS EPITHELIAL CELLS PRESENT     NO ORGANISMS SEEN   Culture NO GROWTH 2 DAYS   Final    Report Status 12/20/2010 FINAL   Final     Medical History: Past Medical History  Diagnosis Date  . Diabetes mellitus   . Hypertension      Assessment: 60 yo male s/p repair of perforated duodenal ulcer, was on vanc, Zosyn, and fluconazole. Narrowed to Zosyn alone yesterday.  Cultures negative, WBC trending up, afebrile. Plan:  1. Continue Zosyn 3.375 mg IV q8 hrs. 2. F/U cultures, LOT.  [place] Crane, Pharm D 12/23/2010 ,9:38 AM

## 2010-12-23 NOTE — Progress Notes (Signed)
Pt. Seen to be comfortable with shoulder.  Will follow up in Dr. Pearla Dubonnet office when discharged. Call [phone] for appt. Thanks.

## 2010-12-23 NOTE — Progress Notes (Signed)
7 Days Post-Op  Subjective: Tolerating clears, doing well, no flatus  Objective: Vital signs in last 24 hours: Temp:  [97.4 F (36.3 C)-98.6 F (37 C)] 98 F (36.7 C) (12/08 0700) Pulse Rate:  [89-115] 89  (12/08 0734) Resp:  [18-31] 26  (12/08 0800) BP: (95-160)/(45-108) 148/70 mmHg (12/08 0800) SpO2:  [95 %-99 %] 95 % (12/08 0734) FiO2 (%):  [2 %] 2 % (12/08 0000) Weight:  [204 lb 5.9 oz (92.7 kg)] 204 lb 5.9 oz (92.7 kg) (12/08 0600) Last BM Date: 12/22/10  Intake/Output from previous day: 12/07 0701 - 12/08 0700 In: 2627.5 [P.O.:130; I.V.:300; IV Piggyback:147.5; TPN:2050] Out: 1465 [Urine:1450; Drains:15] Intake/Output this shift:    General appearance: no distress Resp: diminished breath sounds bibasilar Cardio: regular rate and rhythm, S1, S2 normal, no murmur, click, rub or gallop GI: few bowel sounds, jps with serous output Incision/Wound: vac in place  Lab Results:   Basename 12/23/10 0530 12/22/10 0530  WBC 15.2* 12.3*  HGB 9.9* 9.7*  HCT 30.8* 30.1*  PLT 363 331   BMET  Basename 12/23/10 0530 12/22/10 0530  NA 138 142  K 3.8 3.4*  CL 102 103  CO2 28 29  GLUCOSE 212* 203*  BUN 24* 29*  CREATININE 1.21 1.36*  CALCIUM 9.3 9.2   PT/INR No results found for this basename: LABPROT:2,INR:2 in the last 72 hours ABG  Basename 12/21/10 0430  PHART 7.450  HCO3 29.3*    Studies/Results: Dg Chest Port 1 View  12/22/2010  *RADIOLOGY REPORT*  Clinical Data: Respiratory failure.  PORTABLE CHEST - 1 VIEW  Comparison: 12/21/2010.  Findings: Left central line tip proximal superior vena cava level/azygos vein level.  Nasogastric tube and endotracheal tube have been removed.  Asymmetric air space disease greater on the left.  Question asymmetric pulmonary edema versus infiltrate with consolidation most notable left base.  No gross pneumothorax.  Limited for detection of free intraperitoneal air.  IMPRESSION: Endotracheal tube and nasogastric tube have been  removed. Remainder of findings without significant change as noted above.  Original Report Authenticated By: Doug Sou, M.D.   Dg Duanne Limerick W/water Sol Cm  12/22/2010  *RADIOLOGY REPORT*  Clinical Data:Repair perforated duodenal ulcer.  Check for leak.  WATER SOLUBLE UPPER GI SERIES  Technique:  Single-column upper GI series was performed using water soluble contrast.  Fluoroscopy Time: 2.03  Contrast: 150 ml Omnipaque-300  Comparison:  CT 12/15/2010.  Findings: Fluoroscopic evaluation of the stomach and duodenum was performed after ingestion of 150 ml Omnipaque-300.  I could not position the patient in the far lateral position to visualize the anterior wall due to his humeral fracture.  With the patient in an oblique position, the stomach emptied and I see no evidence of contrast extravasation from the duodenum.  IMPRESSION: No evidence of contrast extravasation from the duodenum.  Original Report Authenticated By: Raelyn Number, M.D.    Anti-infectives: Anti-infectives     Start     Dose/Rate Route Frequency Ordered Stop   12/20/10 1200   vancomycin (VANCOCIN) 750 mg in sodium chloride 0.9 % 150 mL IVPB  Status:  Discontinued        750 mg 150 mL/hr over 60 Minutes Intravenous Every 12 hours 12/19/10 1722 12/22/10 0938   12/17/10 1400   vancomycin (VANCOCIN) 1,250 mg in sodium chloride 0.9 % 250 mL IVPB  Status:  Discontinued        1,250 mg 166.7 mL/hr over 90 Minutes Intravenous Every 12 hours 12/17/10  1223 12/19/10 1722   12/17/10 0800   fluconazole (DIFLUCAN) IVPB 100 mg  Status:  Discontinued        100 mg 50 mL/hr over 60 Minutes Intravenous Every 24 hours 12/16/10 0334 12/22/10 0938   12/16/10 0600  piperacillin-tazobactam (ZOSYN) IVPB 3.375 g       3.375 g 12.5 mL/hr over 240 Minutes Intravenous 3 times per day 12/16/10 0320     12/16/10 0500   fluconazole (DIFLUCAN) IVPB 200 mg        200 mg 100 mL/hr over 60 Minutes Intravenous  Once 12/16/10 0334 12/16/10 [id number]   12/15/10  2030   ampicillin-sulbactam (UNASYN) 1.5 g in sodium chloride 0.9 % 50 mL IVPB        1.5 g 100 mL/hr over 30 Minutes Intravenous  Once 12/15/10 2027 12/15/10 2127          Assessment/Plan: S/p [name] patch for perforated DU, significant contamination  Neuro: pca for pain control CV/Pulm: aggressive pulm toilet GI: will cont clears today ID: cont abx, wbc increased, afebrile, will follow, high risk for abscess Lovenox, scds May tx to floor today   LOS: 8 days    [facility] 12/23/2010

## 2010-12-24 LAB — GLUCOSE, CAPILLARY
Glucose-Capillary: 159 mg/dL — ABNORMAL HIGH (ref 70–99)
Glucose-Capillary: 165 mg/dL — ABNORMAL HIGH (ref 70–99)
Glucose-Capillary: 160 mg/dL — ABNORMAL HIGH (ref 70–99)

## 2010-12-24 LAB — CBC
HCT: 33.2 % — ABNORMAL LOW (ref 39.0–52.0)
MCHC: 32.5 g/dL (ref 30.0–36.0)
Platelets: 396 10*3/uL (ref 150–400)
RDW: 13.8 % (ref 11.5–15.5)
WBC: 18 10*3/uL — ABNORMAL HIGH (ref 4.0–10.5)

## 2010-12-24 LAB — DIFFERENTIAL
Basophils Absolute: 0.1 10*3/uL (ref 0.0–0.1)
Basophils Relative: 0 % (ref 0–1)
Eosinophils Relative: 4 % (ref 0–5)
Lymphocytes Relative: 11 % — ABNORMAL LOW (ref 12–46)
Monocytes Absolute: 0.9 10*3/uL (ref 0.1–1.0)
Neutro Abs: 14.3 10*3/uL — ABNORMAL HIGH (ref 1.7–7.7)

## 2010-12-24 MED ORDER — LORAZEPAM 2 MG/ML IJ SOLN
1.0000 mg | INTRAMUSCULAR | Status: DC | PRN
  Administered 2010-12-25: 1 mg via INTRAVENOUS
  Filled 2010-12-24: qty 1

## 2010-12-24 MED ORDER — INSULIN REGULAR HUMAN 100 UNIT/ML IJ SOLN
INTRAVENOUS | Status: AC
  Administered 2010-12-24: 18:00:00 via INTRAVENOUS
  Filled 2010-12-24: qty 2000

## 2010-12-24 NOTE — Progress Notes (Signed)
Physical Therapy Evaluation Patient Details Name: Harry James MRN: [id number] DOB: 1950/06/11 Today's Date: 12/24/2010  Problem List:  Patient Active Problem List  Diagnoses  . Perforated duodenal ulcer  . Fx humeral neck  . Obesity (BMI 30-39.9)  . Renal insufficiency  . Hypertension    Past Medical History:  Past Medical History  Diagnosis Date  . Diabetes mellitus   . Hypertension    Past Surgical History:  Past Surgical History  Procedure Date  . Cystectomy under tongue  . Laparotomy 12/16/2010    Procedure: EXPLORATORY LAPAROTOMY;  Surgeon: [name], MD;  Location: [facility] OR;  Service: General;  Laterality: N/A;    PT Assessment/Plan/Recommendation PT Assessment Clinical Impression Statement: Patient is a 60 yo male admitted with perforated duodenal ulcer, s/p surgical repair.  Patient also with recent fx of right humerus (3 weeks pta per patient) and is in a sling.  Patient with general weakness throughout, impacting functional mobility and balance.  Patient lives alone (brother lives on property), and was caring for himself following his UE fx.  Would recommend HHPT for continued therapy for mobility and balance at discharge.   PT Recommendation/Assessment: Patient will need skilled PT in the acute care venue PT Problem List: Decreased strength;Decreased activity tolerance;Decreased balance;Decreased mobility PT Therapy Diagnosis : Abnormality of gait;Difficulty walking;Generalized weakness PT Plan PT Frequency: Min 3X/week PT Treatment/Interventions: DME instruction;Gait training;Stair training;Functional mobility training;Balance training;Patient/family education PT Recommendation Follow Up Recommendations: Home health PT Equipment Recommended: Cane (May possibly benefit from cane - TBD) PT Goals  Acute Rehab PT Goals PT Goal Formulation: With patient Time For Goal Achievement: 7 days Pt will go Supine/Side to Sit: Independently;with HOB 0 degrees PT  Goal: Supine/Side to Sit - Progress: Not met Pt will go Sit to Supine/Side: Independently;with HOB 0 degrees PT Goal: Sit to Supine/Side - Progress: Not met Pt will go Sit to Stand: with modified independence PT Goal: Sit to Stand - Progress: Not met Pt will go Stand to Sit: with modified independence PT Goal: Stand to Sit - Progress: Not met Pt will Ambulate: >150 feet;with modified independence;with cane;Other (comment) (without loss of balance) PT Goal: Ambulate - Progress: Not met Pt will Go Up / Down Stairs: 3-5 stairs;with supervision;with least restrictive assistive device PT Goal: Up/Down Stairs - Progress: Not met  PT Evaluation Precautions/Restrictions  Precautions Required Braces or Orthoses: Yes Other Brace/Splint: Sling for RUE Restrictions Weight Bearing Restrictions:  (Unclear re: RUE with recent fracture - left MD note) Other Position/Activity Restrictions: Sling RUE Prior Functioning  Home Living Lives With: Alone (Brother lives on property) Type of Home: House Home Layout: One level Home Access: Stairs to enter Technical brewer of Steps: 3 Home Adaptive Equipment: None Prior Function Level of Independence: Independent with gait;Independent with basic ADLs (States he was "making do" pta with RUE in sling) Cognition Cognition Arousal/Alertness: Awake/alert Overall Cognitive Status: Appears within functional limits for tasks assessed Orientation Level: Oriented to person;Oriented to place;Oriented to situation Sensation/Coordination Sensation Light Touch: Appears Intact Extremity Assessment RUE Assessment RUE Assessment: Not tested (In sling due to recent humeral fx.) LUE Assessment LUE Assessment: Within Functional Limits RLE Assessment RLE Assessment: Within Functional Limits (General weakness - grossly 4/5) LLE Assessment LLE Assessment: Within Functional Limits (General weakness - grossly 4/5) Mobility (including Balance) Bed Mobility Bed  Mobility: Yes Supine to Sit: 4: Min assist;With rails Supine to Sit Details (indicate cue type and reason): Cues for technique and to refrain from using RUE Transfers Transfers: Yes  Sit to Stand: 4: Min assist;With upper extremity assist;From bed Sit to Stand Details (indicate cue type and reason): Cues to use left UE to push up from bed.  Physical assist for safety/balance Stand to Sit: 4: Min assist;With upper extremity assist;With armrests;To chair/3-in-1 Stand to Sit Details: Cues for safe technique and LUE placement Ambulation/Gait Ambulation/Gait: Yes Ambulation/Gait Assistance: 4: Min assist Ambulation/Gait Assistance Details (indicate cue type and reason): Cues to move at slow safe speed. Ambulation Distance (Feet): 62 Feet Assistive device: 1 person hand held assist Gait Pattern: Step-through pattern;Decreased stride length;Trunk flexed (Staggering gait with loss of balance x 4 during gait.) Gait velocity: Slow gait speed Stairs: No  Balance Balance Assessed: Yes Dynamic Standing Balance Dynamic Standing - Balance Support: Left upper extremity supported Dynamic Standing - Level of Assistance: 4: Min assist Dynamic Standing - Balance Activities: Reaching across midline;Other (comment) (with walking and changing speed) Dynamic Standing - Comments: Patient with staggering gait with loss of balance x 4 during gait of 62 feet.  Patient required min assist to regain balance. Exercise    End of Session PT - End of Session Activity Tolerance: Patient limited by fatigue;Other (comment) (General deconditioning and weakness) Patient left: in chair;with call bell in reach Nurse Communication: Mobility status for ambulation General Behavior During Session: [facility] for tasks performed Cognition: [facility] for tasks performed  Despina Pole [id number] 12/24/2010, 12:16 PM

## 2010-12-24 NOTE — Progress Notes (Signed)
8 Days Post-Op  Subjective: Complains of soreness but otherwise okay. No nausea or vomiting  Objective: Vital signs in last 24 hours: Temp:  [97.8 F (36.6 C)-98.6 F (37 C)] 98.5 F (36.9 C) (12/09 0522) Pulse Rate:  [76-90] 76  (12/09 0522) Resp:  [16-30] 20  (12/09 0522) BP: (147-165)/(71-88) 157/88 mmHg (12/09 0522) SpO2:  [94 %-97 %] 97 % (12/09 0522) FiO2 (%):  [2 %] 2 % (12/08 1143) Last BM Date: 12/22/10  Intake/Output from previous day: 12/08 0701 - 12/09 0700 In: 2170.8 [I.V.:75; IV Piggyback:172.5; TPN:1923.3] Out: 805 [Urine:800; Drains:5] Intake/Output this shift:    GI: soft with minimal tenderness. Drains are putting out only serosanguineous fluid, no bilious fluid.good bowel sounds  Lab Results:   Basename 12/23/10 0530 12/22/10 0530  WBC 15.2* 12.3*  HGB 9.9* 9.7*  HCT 30.8* 30.1*  PLT 363 331   BMET  Basename 12/23/10 0530 12/22/10 0530  NA 138 142  K 3.8 3.4*  CL 102 103  CO2 28 29  GLUCOSE 212* 203*  BUN 24* 29*  CREATININE 1.21 1.36*  CALCIUM 9.3 9.2   PT/INR No results found for this basename: LABPROT:2,INR:2 in the last 72 hours ABG No results found for this basename: PHART:2,PCO2:2,PO2:2,HCO3:2 in the last 72 hours  Studies/Results: Dg Ugi W/water Sol Cm  12/22/2010  *RADIOLOGY REPORT*  Clinical Data:Repair perforated duodenal ulcer.  Check for leak.  WATER SOLUBLE UPPER GI SERIES  Technique:  Single-column upper GI series was performed using water soluble contrast.  Fluoroscopy Time: 2.03  Contrast: 150 ml Omnipaque-300  Comparison:  CT 12/15/2010.  Findings: Fluoroscopic evaluation of the stomach and duodenum was performed after ingestion of 150 ml Omnipaque-300.  I could not position the patient in the far lateral position to visualize the anterior wall due to his humeral fracture.  With the patient in an oblique position, the stomach emptied and I see no evidence of contrast extravasation from the duodenum.  IMPRESSION: No evidence of  contrast extravasation from the duodenum.  Original Report Authenticated By: Raelyn Number, M.D.    Anti-infectives: Anti-infectives     Start     Dose/Rate Route Frequency Ordered Stop   12/23/10 2200   piperacillin-tazobactam (ZOSYN) IVPB 3.375 g        3.375 g 12.5 mL/hr over 240 Minutes Intravenous 3 times per day 12/23/10 2032     12/20/10 1200   vancomycin (VANCOCIN) 750 mg in sodium chloride 0.9 % 150 mL IVPB  Status:  Discontinued        750 mg 150 mL/hr over 60 Minutes Intravenous Every 12 hours 12/19/10 1722 12/22/10 0938   12/17/10 1400   vancomycin (VANCOCIN) 1,250 mg in sodium chloride 0.9 % 250 mL IVPB  Status:  Discontinued        1,250 mg 166.7 mL/hr over 90 Minutes Intravenous Every 12 hours 12/17/10 1223 12/19/10 1722   12/17/10 0800   fluconazole (DIFLUCAN) IVPB 100 mg  Status:  Discontinued        100 mg 50 mL/hr over 60 Minutes Intravenous Every 24 hours 12/16/10 0334 12/22/10 0938   12/16/10 0600   piperacillin-tazobactam (ZOSYN) IVPB 3.375 g  Status:  Discontinued        3.375 g 12.5 mL/hr over 240 Minutes Intravenous 3 times per day 12/16/10 0320 12/23/10 1952   12/16/10 0500   fluconazole (DIFLUCAN) IVPB 200 mg        200 mg 100 mL/hr over 60 Minutes Intravenous  Once 12/16/10  [id number] 12/16/10 0613   12/15/10 2030   ampicillin-sulbactam (UNASYN) 1.5 g in sodium chloride 0.9 % 50 mL IVPB        1.5 g 100 mL/hr over 30 Minutes Intravenous  Once 12/15/10 2027 12/15/10 2127          Assessment/Plan: s/p Procedure(s): EXPLORATORY LAPAROTOMY continue clears for now. ambulate. continue abx  LOS: 9 days    TOTH III,Michelene Keniston S 12/24/2010

## 2010-12-25 ENCOUNTER — Encounter (HOSPITAL_COMMUNITY): Payer: Self-pay | Admitting: General Surgery

## 2010-12-25 LAB — COMPREHENSIVE METABOLIC PANEL
Albumin: 2.6 g/dL — ABNORMAL LOW (ref 3.5–5.2)
Alkaline Phosphatase: 282 U/L — ABNORMAL HIGH (ref 39–117)
BUN: 26 mg/dL — ABNORMAL HIGH (ref 6–23)
Creatinine, Ser: 1.27 mg/dL (ref 0.50–1.35)
Potassium: 3.7 mEq/L (ref 3.5–5.1)
Total Protein: 6.3 g/dL (ref 6.0–8.3)

## 2010-12-25 LAB — PHOSPHORUS: Phosphorus: 3.3 mg/dL (ref 2.3–4.6)

## 2010-12-25 LAB — DIFFERENTIAL
Lymphs Abs: 1.8 10*3/uL (ref 0.7–4.0)
Monocytes Relative: 6 % (ref 3–12)
Neutro Abs: 10.1 10*3/uL — ABNORMAL HIGH (ref 1.7–7.7)
Neutrophils Relative %: 76 % (ref 43–77)

## 2010-12-25 LAB — MAGNESIUM: Magnesium: 2.2 mg/dL (ref 1.5–2.5)

## 2010-12-25 LAB — CBC
Hemoglobin: 9.4 g/dL — ABNORMAL LOW (ref 13.0–17.0)
RBC: 3.18 MIL/uL — ABNORMAL LOW (ref 4.22–5.81)

## 2010-12-25 LAB — GLUCOSE, CAPILLARY: Glucose-Capillary: 134 mg/dL — ABNORMAL HIGH (ref 70–99)

## 2010-12-25 LAB — PREALBUMIN: Prealbumin: 28.7 mg/dL (ref 17.0–34.0)

## 2010-12-25 LAB — TRIGLYCERIDES: Triglycerides: 217 mg/dL — ABNORMAL HIGH (ref <150)

## 2010-12-25 MED ORDER — LORAZEPAM 2 MG/ML IJ SOLN
0.0000 mg | Freq: Four times a day (QID) | INTRAMUSCULAR | Status: AC
  Administered 2010-12-27: 1 mg via INTRAVENOUS
  Filled 2010-12-25: qty 1

## 2010-12-25 MED ORDER — LORAZEPAM 2 MG/ML IJ SOLN: 1.0000 mg | Freq: Four times a day (QID) | INTRAMUSCULAR | Status: DC | PRN

## 2010-12-25 MED ORDER — TRACE MINERALS CR-CU-MN-SE-ZN 10-1000-500-60 MCG/ML IV SOLN
INTRAVENOUS | Status: DC
  Administered 2010-12-25: 18:00:00 via INTRAVENOUS
  Filled 2010-12-25: qty 2000

## 2010-12-25 MED ORDER — LORAZEPAM 1 MG PO TABS
1.0000 mg | ORAL_TABLET | Freq: Four times a day (QID) | ORAL | Status: DC | PRN
  Administered 2010-12-25: 1 mg via ORAL
  Filled 2010-12-25: qty 1

## 2010-12-25 MED ORDER — FOLIC ACID 1 MG PO TABS
1.0000 mg | ORAL_TABLET | Freq: Every day | ORAL | Status: DC
  Administered 2010-12-25 – 2010-12-28 (×4): 1 mg via ORAL
  Filled 2010-12-25 (×4): qty 1

## 2010-12-25 MED ORDER — THERA M PLUS PO TABS
1.0000 | ORAL_TABLET | Freq: Every day | ORAL | Status: DC
  Administered 2010-12-25 – 2010-12-28 (×4): 1 via ORAL
  Filled 2010-12-25 (×4): qty 1

## 2010-12-25 MED ORDER — THIAMINE HCL 100 MG/ML IJ SOLN
100.0000 mg | Freq: Every day | INTRAMUSCULAR | Status: DC
  Filled 2010-12-25 (×2): qty 1

## 2010-12-25 MED ORDER — VITAMIN B-1 100 MG PO TABS
100.0000 mg | ORAL_TABLET | Freq: Every day | ORAL | Status: DC
  Administered 2010-12-25 – 2010-12-28 (×4): 100 mg via ORAL
  Filled 2010-12-25 (×4): qty 1

## 2010-12-25 MED ORDER — FAT EMULSION 20 % IV EMUL
240.0000 mL | INTRAVENOUS | Status: AC
  Administered 2010-12-25: 240 mL via INTRAVENOUS
  Filled 2010-12-25: qty 250

## 2010-12-25 MED ORDER — LORAZEPAM 2 MG/ML IJ SOLN: 0.0000 mg | Freq: Two times a day (BID) | INTRAMUSCULAR | Status: DC

## 2010-12-25 NOTE — Progress Notes (Signed)
PARENTERAL NUTRITION CONSULT NOTE - FOLLOW UP  Pharmacy Consult for TPN Indication: perforated DU s/p patch repair  No Known Allergies  Patient Measurements: Height: 5\' 5"  (165.1 cm) Weight: 204 lb 5.9 oz (92.7 kg) IBW/kg (Calculated) : 61.5   Vital Signs: Temp: 98.4 F (36.9 C) (12/10 1015) Temp src: Oral (12/10 1015) BP: 147/83 mmHg (12/10 1015) Pulse Rate: 76  (12/10 1015) Intake/Output from previous day: 12/09 0701 - 12/10 0700 In: 3187.3 [P.O.:1120; IV Piggyback:150; TPN:1917.3] Out: 1310 [Urine:1275; Drains:35] Intake/Output from this shift: Total I/O In: 130 [P.O.:120; I.V.:10] Out: 500 [Urine:500]+ 426 today, -3580 yesterday  Labs:  Basename 12/25/10 0713 12/24/10 1100 12/23/10 0530  WBC 13.3* 18.0* 15.2*  HGB 9.4* 10.8* 9.9*  HCT 29.8* 33.2* 30.8*  PLT 344 396 363  APTT -- -- --  INR -- -- --     Basename 12/25/10 0713 12/23/10 0530  NA 140 138  K 3.7 3.8  CL 106 102  CO2 26 28  GLUCOSE 132* 212*  BUN 26* 24*  CREATININE 1.27 1.21  LABCREA -- --  CREAT24HRUR -- --  CALCIUM 9.6 9.3  MG 2.2 2.1  PHOS 3.3 3.0  PROT 6.3 --  ALBUMIN 2.6* --  AST 50* --  ALT 134* --  ALKPHOS 282* --  BILITOT 0.7 --  BILIDIR -- --  IBILI -- --  PREALBUMIN -- --  CHOLHDL -- --  CHOL 182 --   Estimated Creatinine Clearance: 63.9 ml/min (by C-G formula based on Cr of 1.27).    Basename 12/25/10 0759 12/25/10 0001 12/24/10 2011  GLUCAP 132* 133* 154*    Current Nutrition:  Full liquid diet started today  Assessment: 45 YOM s/p Ex-lap and patch repair of perforated DU on 12/16/10.  Patient start on full liquid diet today. 1.  Endo:  CBGs better controlled post insulin increase in TNA.  Required 18 units SSI. 2.  Lytes:  Mild hypercalcemia, others WNL 3.  Renal fxn stable, good UOP 4.  TC WNL, TG mildly elevated - supplementing lipids MWF.  Nutritional Goals: protein 115-130 gm. Permissive underfeeding goal 1115 - 1300 kcal  Plan:  1. Continue Clinimix E  5/15 at 80 ml/hr, add lipids at 10 ml/hr.  TPN to provide 1843 kcal and 96 gm protein today. 2.  Increase insulin a little more in TPN b/c of large requirement from SSI. 3.  Due to national backorder, lipids, MVI, and trace elements to be given MWF.  4.  Monitor calcium 5.  F/U PO intake to start weaning TPN.   [name] 12/25/2010,11:46 AM

## 2010-12-25 NOTE — Progress Notes (Signed)
ANTIBIOTIC CONSULT NOTE - Follow-Up  Pharmacy Consult:  Zosyn  Indication: Perforated ulcer repair  No Known Allergies  Patient Measurements: Height: 5\' 5"  (165.1 cm) Weight: 204 lb 5.9 oz (92.7 kg) IBW/kg (Calculated) : 61.5    Vital Signs: Temp: 98.4 F (36.9 C) (12/10 1015) Temp src: Oral (12/10 1015) BP: 147/83 mmHg (12/10 1015) Pulse Rate: 76  (12/10 1015) Intake/Output from previous day: 12/09 0701 - 12/10 0700 In: 3187.3 [P.O.:1120; IV Piggyback:150; TPN:1917.3] Out: 1310 [Urine:1275; Drains:35] Intake/Output from this shift: Total I/O In: 120 [P.O.:120] Out: 250 [Urine:250]  Labs:  [facility] 12/25/10 0713 12/24/10 1100 12/23/10 0530  WBC 13.3* 18.0* 15.2*  HGB 9.4* 10.8* 9.9*  PLT 344 396 363  LABCREA -- -- --  CREATININE 1.27 -- 1.21   Estimated Creatinine Clearance: 63.9 ml/min (by C-G formula based on Cr of 1.27). No results found for this basename: VANCOTROUGH:2,VANCOPEAK:2,VANCORANDOM:2,GENTTROUGH:2,GENTPEAK:2,GENTRANDOM:2,TOBRATROUGH:2,TOBRAPEAK:2,TOBRARND:2,AMIKACINPEAK:2,AMIKACINTROU:2,AMIKACIN:2, in the last 72 hours   Microbiology: Recent Results (from the past 720 hour(s))  MRSA PCR SCREENING     Status: Normal   Collection Time   12/16/10  3:09 AM      Component Value Range Status Comment   MRSA by PCR NEGATIVE  NEGATIVE  Final   URINE CULTURE     Status: Normal   Collection Time   12/17/10 10:11 AM      Component Value Range Status Comment   Specimen Description URINE, CLEAN CATCH   Final    Special Requests NONE   Final    Setup Time [id number]   Final    Colony Count NO GROWTH   Final    Culture NO GROWTH   Final    Report Status 12/18/2010 FINAL   Final   CULTURE, BLOOD (ROUTINE X 2)     Status: Normal   Collection Time   12/17/10 10:20 AM      Component Value Range Status Comment   Specimen Description BLOOD LEFT FOOT   Final    Special Requests BOTTLES DRAWN AEROBIC ONLY 5CC   Final    Setup Time [id number]   Final    Culture NO  GROWTH 5 DAYS   Final    Report Status 12/23/2010 FINAL   Final   CULTURE, BLOOD (ROUTINE X 2)     Status: Normal   Collection Time   12/17/10 10:40 AM      Component Value Range Status Comment   Specimen Description BLOOD RIGHT FOOT   Final    Special Requests BOTTLES DRAWN AEROBIC ONLY 5CC   Final    Setup Time [id number]   Final    Culture NO GROWTH 5 DAYS   Final    Report Status 12/23/2010 FINAL   Final   CULTURE, RESPIRATORY     Status: Normal   Collection Time   12/17/10 11:11 AM      Component Value Range Status Comment   Specimen Description TRACHEAL ASPIRATE   Final    Special Requests NONE   Final    Gram Stain     Final    Value: ABUNDANT WBC PRESENT,BOTH PMN AND MONONUCLEAR     RARE SQUAMOUS EPITHELIAL CELLS PRESENT     NO ORGANISMS SEEN   Culture NO GROWTH 2 DAYS   Final    Report Status 12/20/2010 FINAL   Final     Medical History: Past Medical History  Diagnosis Date  . Diabetes mellitus   . Hypertension      Assessment: 60 yo male s/p repair  of perforated duodenal ulcer on D10 Zosyn. All cultures from 12/2 are negative. Renal function has been stable.   Plan:  1. Continue Zosyn 3.375 mg IV q8 hrs (4 hr infusion). 2. Pharmacy will sign-off as no dose adjustments anticipated. Thank you for the consult.   12/25/2010 10:28 AM

## 2010-12-25 NOTE — Consult Note (Signed)
WOC follow up:  Pt. With NPWT dressing changes M/W/F, verified bedside nursing to perform now that on medical/surgical floor. Will sign off Re consult if needed, will not follow at this time. Thanks  Orris Perin Kellogg, [place] [phone])

## 2010-12-25 NOTE — Progress Notes (Signed)
Pt. Found sitting on the floor next to the bed. He said he was trying to get to the bedside commode and lost his balance. He was able to hold on to the chair and that broke his fall. He denies any injury or pain. VS as follows: BP=149/88, RR=20, T=98.5, PR= 111 T=98.5. Dr. Brantley Stage aware. No new orders. Will continue to monitor. Gailen Shelter rn

## 2010-12-25 NOTE — Progress Notes (Signed)
9 Days Post-Op  Subjective: Doing well this AM.  Minimal po intake, he didn't really want clears, Drains 5 Ml each clear liquid. Still on TNA Drainage from JP clear. Wound Vac on M-W-F schedule. Objective: Vital signs in last 24 hours: Temp:  [97.4 F (36.3 C)-98.8 F (37.1 C)] 97.6 F (36.4 C) (12/10 0621) Pulse Rate:  [75-119] 75  (12/10 0621) Resp:  [16-28] 18  (12/10 0621) BP: (143-160)/(68-85) 149/76 mmHg (12/10 0621) SpO2:  [90 %-98 %] 94 % (12/10 0621) Last BM Date: 12/22/10  Intake/Output from previous day: 12/09 0701 - 12/10 0700 In: 3187.3 [P.O.:1120; IV Piggyback:150; TPN:1917.3] Out: 1110 [Urine:1075; Drains:35] Intake/Output this shift:    General appearance: alert, cooperative and no distress Resp: clear to auscultation bilaterally and decreased breath sounds in bases GI: Soft, not tender, +bowel sounds, incision with wound vac in place  Lab Results:   [facility] 12/25/10 0713 12/24/10 1100  WBC 13.3* 18.0*  HGB 9.4* 10.8*  HCT 29.8* 33.2*  PLT 344 396    BMET  Basename 12/23/10 0530  NA 138  K 3.8  CL 102  CO2 28  GLUCOSE 212*  BUN 24*  CREATININE 1.21  CALCIUM 9.3    Lab 12/21/10 0355 12/19/10 0405  AST 18 16  ALT 29 17  ALKPHOS 256* 89  BILITOT 1.3* 2.2*  PROT 6.1 5.3*  ALBUMIN 2.2* 1.9*    PT/INR No results found for this basename: LABPROT:2,INR:2 in the last 72 hours   Studies/Results: No results found.  Anti-infectives: Anti-infectives     Start     Dose/Rate Route Frequency Ordered Stop   12/23/10 2200  piperacillin-tazobactam (ZOSYN) IVPB 3.375 g       3.375 g 12.5 mL/hr over 240 Minutes Intravenous 3 times per day 12/23/10 2032     12/20/10 1200   vancomycin (VANCOCIN) 750 mg in sodium chloride 0.9 % 150 mL IVPB  Status:  Discontinued        750 mg 150 mL/hr over 60 Minutes Intravenous Every 12 hours 12/19/10 1722 12/22/10 0938   12/17/10 1400   vancomycin (VANCOCIN) 1,250 mg in sodium chloride 0.9 % 250 mL IVPB  Status:   Discontinued        1,250 mg 166.7 mL/hr over 90 Minutes Intravenous Every 12 hours 12/17/10 1223 12/19/10 1722   12/17/10 0800   fluconazole (DIFLUCAN) IVPB 100 mg  Status:  Discontinued        100 mg 50 mL/hr over 60 Minutes Intravenous Every 24 hours 12/16/10 0334 12/22/10 0938   12/16/10 0600   piperacillin-tazobactam (ZOSYN) IVPB 3.375 g  Status:  Discontinued        3.375 g 12.5 mL/hr over 240 Minutes Intravenous 3 times per day 12/16/10 0320 12/23/10 1952   12/16/10 0500   fluconazole (DIFLUCAN) IVPB 200 mg        200 mg 100 mL/hr over 60 Minutes Intravenous  Once 12/16/10 0334 12/16/10 0613   12/15/10 2030   ampicillin-sulbactam (UNASYN) 1.5 g in sodium chloride 0.9 % 50 mL IVPB        1.5 g 100 mL/hr over 30 Minutes Intravenous  Once 12/15/10 2027 12/15/10 2127         Current Facility-Administered Medications  Medication Dose Route Frequency Provider Last Rate Last Dose  . antiseptic oral rinse (BIOTENE) solution 15 mL  15 mL Mouth Rinse QID [name], MD   15 mL at 12/25/10 0434  . dextrose 5 % solution   Intravenous Continuous  Wesam Yacoub 20 mL/hr at 12/23/10 0700    . diphenhydrAMINE (BENADRYL) injection 12.5 mg  12.5 mg Intravenous Q6H PRN [name], MD       Or  . diphenhydrAMINE (BENADRYL) 12.5 MG/5ML elixir 12.5 mg  12.5 mg Oral Q6H PRN [name], MD      . heparin injection 5,000 Units  5,000 Units Subcutaneous Q8H [name], MD   5,000 Units at 12/25/10 0545  . insulin aspart (novoLOG) injection 0-15 Units  0-15 Units Subcutaneous Q4H Merton Border, MD   2 Units at 12/25/10 [id number]  . ipratropium (ATROVENT) nebulizer solution 0.5 mg  0.5 mg Nebulization Q6H Merton Border, MD   0.5 mg at 12/25/10 0142  . levalbuterol (XOPENEX) nebulizer solution 0.63 mg  0.63 mg Nebulization Q6H Merton Border, MD   0.63 mg at 12/25/10 0143  . LORazepam (ATIVAN) injection 1 mg  1 mg Intravenous Q4H PRN Thomas A. Cornett, MD   1 mg at 12/25/10 0044  .  metoprolol (LOPRESSOR) injection 10 mg  10 mg Intravenous Q6H Wesam Yacoub   10 mg at 12/25/10 0545  . morphine 1 MG/ML PCA injection   Intravenous Q4H [name], MD   1 mg at 12/24/10 0800  . naloxone [facility]) injection 0.4 mg  0.4 mg Intravenous PRN [name], MD       And  . sodium chloride 0.9 % injection 9 mL  9 mL Intravenous PRN [name], MD      . ondansetron [facility]) injection 4 mg  4 mg Intravenous Q6H PRN [name], MD      . pantoprazole (PROTONIX) injection 40 mg  40 mg Intravenous Q12H [name], MD   40 mg at 12/24/10 2152  . piperacillin-tazobactam (ZOSYN) IVPB 3.375 g  3.375 g Intravenous Q8H [name], MD   3.375 g at 12/25/10 0553  . pneumococcal 23 valent vaccine (PNU-IMMUNE) injection 0.5 mL  0.5 mL Intramuscular Prior to discharge [name], MD      . sodium chloride 0.9 % injection 10 mL  10 mL Intracatheter Q12H Adela Lank Cox, RN   10 mL at 12/24/10 1120  . sodium chloride 0.9 % injection 10 mL  10 mL Intracatheter PRN Carolyne Fiscal, RN      . tpn solution (CLINIMIX E 5/15) 2,000 mL with insulin regular 40 Units infusion   Intravenous Continuous TPN Lora Poteet Seay, PHARMD 80 mL/hr at 12/23/10 1806    . tpn solution (CLINIMIX E 5/15) 2,000 mL with insulin regular 50 Units infusion   Intravenous Continuous TPN Lora Poteet Seay, PHARMD 80 mL/hr at 12/24/10 1819      Assessment/Plan POD9  Exploratory lap, graham patch perforated duodenal Ulcer;Hx NSAID USE;UGI 12/7-No contrast extravasation Post-Op Resp failure, Pulmonary edema;extubated 12/22/10 Post op hypotension/sepsis due to perforation Fx. R humeral head AODM Hypertension Tobacco:1ppd Etoh:1/5 per week Still on Zosyn and diflucan Obesity. Plan: Advance diet, SIWA protocol, continue to mobilize, wean O2,  Look at wound with Vac change.    LOS: 10 days    Allia Wiltsey 12/25/2010

## 2010-12-25 NOTE — Progress Notes (Signed)
Occupational Therapy Evaluation Patient Details Name: Harry James MRN: [id number] DOB: December 24, 1950 Today's Date: 12/25/2010  Problem List:  Patient Active Problem List  Diagnoses  . Perforated duodenal ulcer  . Fx humeral neck  . Obesity (BMI 30-39.9)  . Renal insufficiency  . Hypertension    Past Medical History:  Past Medical History  Diagnosis Date  . Diabetes mellitus   . Hypertension    Past Surgical History:  Past Surgical History  Procedure Date  . Cystectomy under tongue  . Laparotomy 12/16/2010    Procedure: EXPLORATORY LAPAROTOMY;  Surgeon: [name], MD;  Location: [facility];  Service: General;  Laterality: N/A;    OT Assessment/Plan/Recommendation OT Assessment Clinical Impression Statement: Pt with decreased I with ADLs and functional transfers due to perforated duodenal ulcer, s/p surgical repair with complication of R humeral fx pta.   Pt will benefit from acute OT to increase I with BADLs and functional transfers in prep for d/c home.  OT Recommendation/Assessment: Patient will need skilled OT in the acute care venue OT Problem List: Decreased activity tolerance OT Therapy Diagnosis : Generalized weakness;Acute pain OT Plan OT Frequency: Min 2X/week OT Treatment/Interventions: Self-care/ADL training;DME and/or AE instruction;Therapeutic activities;Patient/family education OT Recommendation Follow Up Recommendations: Home health OT Equipment Recommended: Other (comment) (To be determined at next session) Individuals Consulted Consulted and Agree with Results and Recommendations: Patient OT Goals Acute Rehab OT Goals OT Goal Formulation: With patient Time For Goal Achievement: 2 weeks ADL Goals Pt Will Perform Grooming: with modified independence;Standing at sink;Unsupported ADL Goal: Grooming - Progress: Other (comment) Pt Will Perform Upper Body Bathing: with modified independence;Sitting, chair;Sitting, edge of bed;Unsupported ADL Goal: Upper  Body Bathing - Progress: Other (comment) Pt Will Perform Lower Body Bathing: with modified independence;Sit to stand from bed;Unsupported;Sit to stand from chair ADL Goal: Lower Body Bathing - Progress: Other (comment) Pt Will Perform Upper Body Dressing: with modified independence;Sitting, chair;Sitting, bed;Unsupported ADL Goal: Upper Body Dressing - Progress: Other (comment) Pt Will Perform Lower Body Dressing: with modified independence;Sit to stand from bed;Sit to stand from chair;Unsupported ADL Goal: Lower Body Dressing - Progress: Other (comment) Pt Will Transfer to Toilet: with modified independence;Regular height toilet;with DME;Ambulation;3-in-1 ADL Goal: Toilet Transfer - Progress: Other (comment) Pt Will Perform Tub/Shower Transfer: Tub transfer;Shower transfer;with modified independence;with DME;Ambulation (tub or shower based on what pt prefers) ADL Goal: Tub/Shower Transfer - Progress: Other (comment)  OT Evaluation Precautions/Restrictions  Precautions Required Braces or Orthoses: Yes Other Brace/Splint: Sling for RUE Restrictions Weight Bearing Restrictions: No (WB restrictions not clarified by MD yet) Other Position/Activity Restrictions: Sling RUE Prior Functioning Home Living Lives With: Alone (Brother lives on property) Type of Home: House Home Layout: One level Home Access: Stairs to enter [employer] of Steps: 3 Bathroom Shower/Tub: Tub/shower unit;Walk-in shower Bathroom Toilet: Standard Home Adaptive Equipment: None Prior Function Level of Independence: Independent with gait;Independent with basic ADLs ADL ADL Eating/Feeding: Simulated;Independent Where Assessed - Eating/Feeding: Edge of bed Grooming: Simulated;Set up Where Assessed - Grooming: Sitting, bed;Unsupported Upper Body Bathing: Simulated;Minimal assistance Where Assessed - Upper Body Bathing: Sitting, bed;Unsupported Lower Body Bathing: Simulated;Minimal assistance Where  Assessed - Lower Body Bathing: Lean right and/or left;Sitting, bed;Unsupported Where Assessed - Upper Body Dressing: Unsupported;Sitting, bed Lower Body Dressing: Simulated;Supervision/safety Where Assessed - Lower Body Dressing: Sitting, bed;Lean right and/or left;Unsupported Toilet Transfer: Not assessed Toilet Transfer Method: Not assessed Toileting - Clothing Manipulation: Not assessed Where Assessed - Toileting Clothing Manipulation: Not assessed Toileting - Hygiene: Not assessed Where Assessed -  Toileting Hygiene: Not assessed Tub/Shower Transfer: Not assessed Tub/Shower Transfer Method: Not assessed ADL Comments: Pt unable to recall precaution or follow up MD appt for R shoulder.Eval limited due to pt declining to get OOB.  Pt still "groggy" from pain meds and slow to process information during eval.  Plan to assess OOB functional transfers at next session Vision/Perception    Cognition Cognition Arousal/Alertness: Lethargic Overall Cognitive Status: Difficult to assess Difficult to assess due to: level of arousal Orientation Level: Oriented to person;Oriented to place;Oriented to situation Cognition - Other Comments: Pt slow to process information possibly due to pain meds Sensation/Coordination   Extremity Assessment RUE Assessment RUE Assessment: Not tested (in sling due to recent humeral fx) LUE Assessment LUE Assessment: Within Functional Limits Mobility  Bed Mobility Bed Mobility: Yes Supine to Sit: 5: Supervision;With rails;HOB elevated (Comment degrees) (30) Transfers Transfers: No Exercises   End of Session OT - End of Session Activity Tolerance: Patient limited by fatigue Patient left: in bed;with call bell in reach General Behavior During Session: [facility] for tasks performed Cognition: [facility] for tasks performed   Darrol Jump 12/25/2010, 3:41 PM  12/25/2010 Darrol Jump OTR/L Pager [phone] Office [phone]

## 2010-12-25 NOTE — Progress Notes (Signed)
Patient resting comfortably.  Had some confusion yesterday - EtOH history.  Minimal drain output. WBC decreasing.  VAC change today.  Imogene Burn. Georgette Dover, MD, [facility]  12/25/2010 9:36 AM

## 2010-12-26 DIAGNOSIS — F1011 Alcohol abuse, in remission: Secondary | ICD-10-CM

## 2010-12-26 DIAGNOSIS — F17200 Nicotine dependence, unspecified, uncomplicated: Secondary | ICD-10-CM | POA: Diagnosis present

## 2010-12-26 DIAGNOSIS — J95821 Acute postprocedural respiratory failure: Secondary | ICD-10-CM

## 2010-12-26 DIAGNOSIS — I7101 Dissection of thoracic aorta: Secondary | ICD-10-CM

## 2010-12-26 DIAGNOSIS — J449 Chronic obstructive pulmonary disease, unspecified: Secondary | ICD-10-CM

## 2010-12-26 LAB — GLUCOSE, CAPILLARY
Glucose-Capillary: 135 mg/dL — ABNORMAL HIGH (ref 70–99)
Glucose-Capillary: 125 mg/dL — ABNORMAL HIGH (ref 70–99)

## 2010-12-26 MED ORDER — TRACE MINERALS CR-CU-MN-SE-ZN 10-1000-500-60 MCG/ML IV SOLN
INTRAVENOUS | Status: AC
  Filled 2010-12-26: qty 2000

## 2010-12-26 MED ORDER — ACETAMINOPHEN 325 MG PO TABS: 650.0000 mg | ORAL_TABLET | Freq: Four times a day (QID) | ORAL | Status: DC | PRN

## 2010-12-26 MED ORDER — LORAZEPAM 2 MG/ML IJ SOLN: 1.0000 mg | Freq: Four times a day (QID) | INTRAMUSCULAR | Status: AC | PRN

## 2010-12-26 MED ORDER — MORPHINE SULFATE (PF) 1 MG/ML IV SOLN
INTRAVENOUS | Status: AC
  Administered 2010-12-26: 05:00:00
  Filled 2010-12-26: qty 25

## 2010-12-26 MED ORDER — METOPROLOL TARTRATE 12.5 MG HALF TABLET
12.5000 mg | ORAL_TABLET | Freq: Three times a day (TID) | ORAL | Status: DC
  Administered 2010-12-26 – 2010-12-27 (×6): 12.5 mg via ORAL
  Filled 2010-12-26 (×8): qty 1

## 2010-12-26 MED ORDER — INSULIN REGULAR HUMAN 100 UNIT/ML IJ SOLN
INTRAVENOUS | Status: AC
  Administered 2010-12-26: 17:00:00 via INTRAVENOUS
  Filled 2010-12-26: qty 2000

## 2010-12-26 MED ORDER — POLYETHYLENE GLYCOL 3350 17 G PO PACK
17.0000 g | PACK | Freq: Every day | ORAL | Status: DC
  Administered 2010-12-26 – 2010-12-28 (×3): 17 g via ORAL
  Filled 2010-12-26 (×3): qty 1

## 2010-12-26 MED ORDER — MORPHINE SULFATE (PF) 1 MG/ML IV SOLN
INTRAVENOUS | Status: AC
  Administered 2010-12-26: 02:00:00
  Filled 2010-12-26: qty 25

## 2010-12-26 MED ORDER — MORPHINE SULFATE 2 MG/ML IJ SOLN: 1.0000 mg | INTRAMUSCULAR | Status: DC | PRN

## 2010-12-26 MED ORDER — OXYCODONE-ACETAMINOPHEN 5-325 MG PO TABS
1.0000 | ORAL_TABLET | ORAL | Status: DC | PRN
  Administered 2010-12-26 – 2010-12-27 (×2): 2 via ORAL
  Filled 2010-12-26 (×2): qty 2

## 2010-12-26 MED ORDER — LORAZEPAM 0.5 MG PO TABS: 0.5000 mg | ORAL_TABLET | Freq: Four times a day (QID) | ORAL | Status: AC | PRN

## 2010-12-26 NOTE — Progress Notes (Signed)
   CARE MANAGEMENT NOTE 12/26/2010  Patient:  Harry James, Harry James   Account Number:  [id number]  Date Initiated:  12/19/2010  Documentation initiated by:  Sandi Mariscal  Subjective/Objective Assessment:   perforated duodenal ulcer requiring surgical repair. On vent in ICU with significant agitation noted.     Action/Plan:   Await improvement to determine pt's needs at d/c. Home HH vs SNF is best estimate now.   Anticipated DC Date:  12/28/2010   Anticipated DC Plan:  [place]  CM consult               Status of service:  In process, will continue to follow  Per UR Regulation:  Reviewed for med. necessity/level of care/duration of stay  Comments:  12/26/2010 Sandi Mariscal, RN BSN CCM 1628--Pt on acute unit since 12/8. VAC still in place. Await MD decision on whether pt needs VAC for d/c or just HHRN and wet->dry dressing changes. VAC approval from [place] notoriously slow and pt's often d/c home with wet->dry dressing until [facility] delivers from [facility] provider. Will notify MD/PA if this is the case.

## 2010-12-26 NOTE — Progress Notes (Addendum)
PARENTERAL NUTRITION CONSULT NOTE - FOLLOW UP  Pharmacy Consult for TPN Indication: perforated DU s/p patch repair  No Known Allergies  Patient Measurements: Height: 5\' 5"  (165.1 cm) Weight: 204 lb 5.9 oz (92.7 kg) IBW/kg (Calculated) : 61.5   Vital Signs: Temp: 97.9 F (36.6 C) (12/11 1026) BP: 154/73 mmHg (12/11 1026) Pulse Rate: 115  (12/11 1026) Intake/Output from previous day: 12/10 0701 - 12/11 0700 In: 3449.3 [P.O.:320; I.V.:1127.9; IV Piggyback:100; TPN:1901.3] Out: 2606 [Urine:2575; Drains:31] Intake/Output from this shift: Total I/O In: 240 [P.O.:240] Out: 250 [Urine:250]+ 426 today, -3580 yesterday  Labs:  [facility] 12/25/10 0713 12/24/10 1100  WBC 13.3* 18.0*  HGB 9.4* 10.8*  HCT 29.8* 33.2*  PLT 344 396  APTT -- --  INR -- --     Basename 12/25/10 0713  NA 140  K 3.7  CL 106  CO2 26  GLUCOSE 132*  BUN 26*  CREATININE 1.27  LABCREA --  CREAT24HRUR --  CALCIUM 9.6  MG 2.2  PHOS 3.3  PROT 6.3  ALBUMIN 2.6*  AST 50*  ALT 134*  ALKPHOS 282*  BILITOT 0.7  BILIDIR --  IBILI --  PREALBUMIN 28.7  CHOLHDL --  CHOL 182   Estimated Creatinine Clearance: 63.9 ml/min (by C-G formula based on Cr of 1.27).    Basename 12/26/10 0804 12/26/10 0359 12/25/10 2358  GLUCAP 102* 106* 104*    Current Nutrition:  Full liquid diet started today  Assessment: 70 YOM s/p Ex-lap and patch repair of perforated DU on 12/16/10.  Patient to start on a regular diet today and pharmacy to start weaning TPN. 1.  Endo:  CBGs better controlled post insulin increase in TNA (14 units in TPN). 2.  Lytes:  No labs today 3.  Renal fxn stable, good UOP 4.  Prealbumin WNL and increasing.  Nutritional Goals: protein 115-130 gm. Permissive underfeeding goal 1115 - 1300 kcal  Plan:  1. Decrease Clinimix E 5/15 to 60 ml/hr (goal 80 ml/hr), no lipids today so decrease Clinimix rate minimally. 2.  CBGs controlled but will decrease insulin slightly since Clinimix rate is  decreased. 3.  Due to national backorder, lipids, MVI, and trace elements to be given MWF.  4.   F/U PO intake to d/c TPN.   Harry James, Harry James 12/26/2010,10:30 AM

## 2010-12-26 NOTE — Progress Notes (Signed)
Patient ID: Harry James, male   DOB: 05/17/1950, 60 y.o.   MRN: [id number] 10 Days Post-Op  Subjective: Doing well this AM.  Minimal po intake, he  Doesn't like the Full liquids either.Drainage from JP clear. Wound Vac on M-W-F schedule. He was pretty shakey yesterday morning, but ativan really knocked him out to the point he couldn't do PT, and didn't really eat much. Pt. Feeling OK today, not really interested in talking to anyone about ETOH use. Objective: Vital signs in last 24 hours: Temp:  [97 F (36.1 C)-98.4 F (36.9 C)] 98.1 F (36.7 C) (12/11 0522) Pulse Rate:  [71-88] 71  (12/11 0522) Resp:  [18-22] 20  (12/11 0720) BP: (142-187)/(58-87) 142/58 mmHg (12/11 0522) SpO2:  [91 %-100 %] 92 % (12/11 0720) Last BM Date: 12/24/10  Intake/Output from previous day: 12/10 0701 - 12/11 0700 In: 3449.3 [P.O.:320; I.V.:1127.9; IV Piggyback:100; TPN:1901.3] Out: 2606 [Urine:2575; Drains:31] Intake/Output this shift:   He is not tremulous this AM General appearance: alert, cooperative and no distress Resp: clear to auscultation bilaterally and decreased breath sounds in bases GI: Soft, not tender, +bowel sounds, incision with wound vac in place. Clear drainage from JP's  Lab Results:   [facility] 12/25/10 0713 12/24/10 1100  WBC 13.3* 18.0*  HGB 9.4* 10.8*  HCT 29.8* 33.2*  PLT 344 396    BMET  Basename 12/25/10 0713  NA 140  K 3.7  CL 106  CO2 26  GLUCOSE 132*  BUN 26*  CREATININE 1.27  CALCIUM 9.6    Lab 12/25/10 0713 12/21/10 0355  AST 50* 18  ALT 134* 29  ALKPHOS 282* 256*  BILITOT 0.7 1.3*  PROT 6.3 6.1  ALBUMIN 2.6* 2.2*    PT/INR No results found for this basename: LABPROT:2,INR:2 in the last 72 hours   Studies/Results: No results found.  Anti-infectives: Anti-infectives     Start     Dose/Rate Route Frequency Ordered Stop   12/23/10 2200   piperacillin-tazobactam (ZOSYN) IVPB 3.375 g        3.375 g 12.5 mL/hr over 240 Minutes Intravenous 3  times per day 12/23/10 2032     12/20/10 1200   vancomycin (VANCOCIN) 750 mg in sodium chloride 0.9 % 150 mL IVPB  Status:  Discontinued        750 mg 150 mL/hr over 60 Minutes Intravenous Every 12 hours 12/19/10 1722 12/22/10 0938   12/17/10 1400   vancomycin (VANCOCIN) 1,250 mg in sodium chloride 0.9 % 250 mL IVPB  Status:  Discontinued        1,250 mg 166.7 mL/hr over 90 Minutes Intravenous Every 12 hours 12/17/10 1223 12/19/10 1722   12/17/10 0800   fluconazole (DIFLUCAN) IVPB 100 mg  Status:  Discontinued        100 mg 50 mL/hr over 60 Minutes Intravenous Every 24 hours 12/16/10 0334 12/22/10 0938   12/16/10 0600   piperacillin-tazobactam (ZOSYN) IVPB 3.375 g  Status:  Discontinued        3.375 g 12.5 mL/hr over 240 Minutes Intravenous 3 times per day 12/16/10 0320 12/23/10 1952   12/16/10 0500   fluconazole (DIFLUCAN) IVPB 200 mg        200 mg 100 mL/hr over 60 Minutes Intravenous  Once 12/16/10 0334 12/16/10 0613   12/15/10 2030   ampicillin-sulbactam (UNASYN) 1.5 g in sodium chloride 0.9 % 50 mL IVPB        1.5 g 100 mL/hr over 30 Minutes Intravenous  Once 12/15/10  2027 12/15/10 2127         Current Facility-Administered Medications  Medication Dose Route Frequency Provider Last Rate Last Dose  . antiseptic oral rinse (BIOTENE) solution 15 mL  15 mL Mouth Rinse QID [name], MD   15 mL at 12/26/10 0403  . diphenhydrAMINE (BENADRYL) injection 12.5 mg  12.5 mg Intravenous Q6H PRN [name], MD       Or  . diphenhydrAMINE (BENADRYL) 12.5 MG/5ML elixir 12.5 mg  12.5 mg Oral Q6H PRN [name], MD      . fat emulsion 20 % infusion 240 mL  240 mL Intravenous Continuous TPN Saundra Shelling, PHARMD 10 mL/hr at 12/25/10 1825 240 mL at 12/25/10 1825  . folic acid (FOLVITE) tablet 1 mg  1 mg Oral Daily Earnstine Regal, [place]   1 mg at 12/25/10 1126  . heparin injection 5,000 Units  5,000 Units Subcutaneous Q8H [name], MD   5,000 Units at 12/26/10 0630   . insulin aspart (novoLOG) injection 0-15 Units  0-15 Units Subcutaneous Q4H Merton Border, MD   2 Units at 12/25/10 1957  . ipratropium (ATROVENT) nebulizer solution 0.5 mg  0.5 mg Nebulization Q6H Merton Border, MD   0.5 mg at 12/26/10 0224  . levalbuterol (XOPENEX) nebulizer solution 0.63 mg  0.63 mg Nebulization Q6H Merton Border, MD   0.63 mg at 12/26/10 0224  . LORazepam (ATIVAN) injection 0-4 mg  0-4 mg Intravenous Q6H Earnstine Regal, [place]      . LORazepam (ATIVAN) tablet 1 mg  1 mg Oral Q6H PRN Earnstine Regal, PA   1 mg at 12/25/10 1127   Or  . LORazepam (ATIVAN) injection 1 mg  1 mg Intravenous Q6H PRN Earnstine Regal, PA      . metoprolol (LOPRESSOR) injection 10 mg  10 mg Intravenous Q6H Wesam Yacoub   10 mg at 12/26/10 0630  . morphine 1 MG/ML PCA injection   Intravenous Q4H [name], MD   3 mg at 12/26/10 0720  . morphine 1 MG/ML PCA injection           . morphine 1 MG/ML PCA injection           . multivitamins ther. w/minerals tablet 1 tablet  1 tablet Oral Daily Earnstine Regal, [place]   1 tablet at 12/25/10 1131  . naloxone [facility]) injection 0.4 mg  0.4 mg Intravenous PRN [name], MD       And  . sodium chloride 0.9 % injection 9 mL  9 mL Intravenous PRN [name], MD      . ondansetron [facility]) injection 4 mg  4 mg Intravenous Q6H PRN [name], MD      . pantoprazole (PROTONIX) injection 40 mg  40 mg Intravenous Q12H [name], MD   40 mg at 12/25/10 2204  . piperacillin-tazobactam (ZOSYN) IVPB 3.375 g  3.375 g Intravenous Q8H [name], MD   3.375 g at 12/26/10 0630  . pneumococcal 23 valent vaccine (PNU-IMMUNE) injection 0.5 mL  0.5 mL Intramuscular Prior to discharge [name], MD      . sodium chloride 0.9 % injection 10 mL  10 mL Intracatheter Q12H Adela Lank Cox, RN   10 mL at 12/25/10 1133  . sodium chloride 0.9 % injection 10 mL  10 mL Intracatheter PRN Carolyne Fiscal, RN   10 mL at 12/25/10 1832  . thiamine  (VITAMIN B-1) tablet 100 mg  100 mg Oral Daily Earnstine Regal, [place]   100  mg at 12/25/10 1126  . tpn solution (CLINIMIX E 5/15) 2,000 mL with insulin regular 50 Units infusion   Intravenous Continuous TPN Lora Poteet Seay, PHARMD 80 mL/hr at 12/24/10 1819    . tpn solution (CLINIMIX E 5/15) 2,000 mL with multivitamins adult 10 mL, trace elements Cr-Cu-Mn-Se-Zn 1 mL, insulin regular 57 Units infusion   Intravenous Continuous TPN Thuy Dien Dang, PHARMD 80 mL/hr at 12/25/10 1825    . DISCONTD: dextrose 5 % solution   Intravenous Continuous Wesam Yacoub 20 mL/hr at 12/23/10 0700    . DISCONTD: LORazepam (ATIVAN) injection 0-4 mg  0-4 mg Intravenous Q12H Earnstine Regal, [place]      . DISCONTD: thiamine (B-1) injection 100 mg  100 mg Intravenous Daily Earnstine Regal, [place]        Assessment/Plan POD9  Exploratory lap, graham patch perforated duodenal Ulcer;Hx NSAID USE;UGI 12/7-No contrast extravasation Post-Op Resp failure, Pulmonary edema;extubated 12/22/10 Post op hypotension/sepsis due to perforation Fx. R humeral head AODM Hypertension Tobacco:1ppd Etoh:1/5 per week Still on Zosyn and diflucan Obesity. Plan: Advance diet, SIWA protocol, continue to mobilize, wean O2, also on Nebulizers, wean TNA., Miralax   LOS: 11 days    Pebbles Zeiders 12/26/2010

## 2010-12-26 NOTE — Progress Notes (Signed)
Slight improvement. PT Advance diet  Imogene Burn. Georgette Dover, MD, [facility]  12/26/2010 9:36 AM

## 2010-12-26 NOTE — Progress Notes (Signed)
Physical Therapy Treatment Patient Details Name: Harry James MRN: [id number] DOB: [birth year] Today's Date: 12/26/2010  PT Assessment/Plan  PT - Assessment/Plan Comments on Treatment Session: Pt progressing well. Pt requires cues to call for assistance when getting up due to fall risk. Upon initially walking into room patient was coming back from the restroom without assistance. Pt does not have around the clock care at home but refuses to go anywhere but his house upon DC from hospital.  PT Plan: Discharge plan remains appropriate PT Frequency: Min 3X/week Follow Up Recommendations: Home health PT Equipment Recommended: Harry James PT Goals  Acute Rehab PT Goals PT Goal: Sit to Supine/Side - Progress: Progressing toward goal PT Goal: Sit to Stand - Progress: Progressing toward goal PT Goal: Stand to Sit - Progress: Progressing toward goal PT Goal: Ambulate - Progress: Progressing toward goal PT Goal: Up/Down Stairs - Progress: Progressing toward goal  PT Treatment Precautions/Restrictions  Precautions Precautions: Fall Required Braces or Orthoses: Yes Other Brace/Splint: sling RUE Restrictions Weight Bearing Restrictions: No Other Position/Activity Restrictions: Taking RUE NWB as patient had fx in RUE 38 weeks old. No weightbearing restrictions listed in chart.  Mobility (including Balance) Bed Mobility Sit to Supine - Right: 6: Modified independent (Device/Increase time) Transfers Sit to Stand: 5: Supervision;From chair/3-in-1;With armrests Sit to Stand Details (indicate cue type and reason): Cues for safety Stand to Sit: With upper extremity assist;To bed;5: Supervision Ambulation/Gait Ambulation/Gait Assistance: 4: Min assist Ambulation/Gait Assistance Details (indicate cue type and reason): Min A x1 for R lateral LOB. Cues for safety. Pt doing a great job of manuvering IV pole around objects. Pts looks to check if space is cleared.  Ambulation Distance (Feet): 240  Feet Assistive device: Other (Comment) (IV Pole) Gait Pattern: Lateral trunk lean to right;Step-through pattern;Decreased stride length Stairs: Yes Stairs Assistance: Other (comment) (MinGuard a) Stair Management Technique: One rail Left Number of Stairs: 3     Exercise    End of Session PT - End of Session Equipment Utilized During Treatment: Gait belt Activity Tolerance: Patient tolerated treatment well Patient left: in bed;with call bell in reach Nurse Communication: Mobility status for transfers;Mobility status for ambulation General Behavior During Session: [facility] for tasks performed Cognition: [facility] for tasks performed  [place], Tonia Brooms 12/26/2010, 2:33 PM 12/26/2010 Harry James PTA [phone] pager [phone] office

## 2010-12-26 NOTE — Progress Notes (Signed)
Occupational Therapy Treatment Patient Details Name: Harry James MRN: [id number] DOB: 03/28/1950 Today's Date: 12/26/2010  OT Assessment/Plan OT Assessment/Plan Comments on Treatment Session: Pt progressing towards goals.  Pt reports that he would like a transfer tub bench for home and demonstrated a tub transfer during session. OT Plan: Discharge plan remains appropriate OT Frequency: Min 2X/week Follow Up Recommendations: Home health OT Equipment Recommended: Tub/shower bench OT Goals Acute Rehab OT Goals OT Goal Formulation: With patient Time For Goal Achievement: 2 weeks ADL Goals Pt Will Perform Grooming: with modified independence;Standing at sink;Unsupported ADL Goal: Grooming - Progress: Not addressed Pt Will Perform Upper Body Bathing: with modified independence;Sitting, chair;Sitting, edge of bed;Unsupported ADL Goal: Upper Body Bathing - Progress: Not addressed Pt Will Perform Lower Body Bathing: with modified independence;Sit to stand from bed;Unsupported;Sit to stand from chair ADL Goal: Lower Body Bathing - Progress: Not addressed Pt Will Perform Upper Body Dressing: with modified independence;Sitting, chair;Sitting, bed;Unsupported ADL Goal: Upper Body Dressing - Progress: Not addressed Pt Will Perform Lower Body Dressing: with modified independence;Sit to stand from bed;Sit to stand from chair;Unsupported ADL Goal: Lower Body Dressing - Progress: Not addressed Pt Will Transfer to Toilet: with modified independence;Regular height toilet;with DME;Ambulation;3-in-1 ADL Goal: Toilet Transfer - Progress: Progressing toward goals Pt Will Perform Tub/Shower Transfer: Tub transfer;Shower transfer;with modified independence;with DME;Ambulation ADL Goal: Tub/Shower Transfer - Progress: Progressing toward goals  OT Treatment Precautions/Restrictions  Precautions Precautions: Fall Required Braces or Orthoses: Yes Other Brace/Splint: sling RUE Restrictions Weight Bearing  Restrictions: No Other Position/Activity Restrictions: Taking RUE NWB as patient had fx in RUE 4 weeks ago and MD has not clarified otherwise.   ADL ADL Toilet Transfer: Simulated;Minimal assistance Toilet Transfer Method: Ambulating Tub/Shower Transfer: Performed;Minimal Museum/gallery conservator Details (indicate cue type and reason): Min guard assist for safety and steadying Tub/Shower Transfer Method: Therapist, art: IT consultant Used: Other (comment) (Pt held IV pole with LUE for support. ) Mobility  Bed Mobility Bed Mobility: Yes Sit to Supine - Right: 6: Modified independent (Device/Increase time) Transfers Transfers: Yes Sit to Stand: 5: Supervision;From chair/3-in-1;With armrests Sit to Stand Details (indicate cue type and reason): cues for safety Stand to Sit: With upper extremity assist;To bed;5: Supervision Exercises    End of Session OT - End of Session Equipment Utilized During Treatment: Gait belt;Other (comment) (sling for RUE) Activity Tolerance: Patient tolerated treatment well Patient left: in bed;with call bell in reach General Behavior During Session: [facility] for tasks performed Cognition: [facility] for tasks performed  Darrol Jump  12/26/2010, 4:30 PM 12/26/2010 Darrol Jump OTR/L Pager [phone] Office [phone]

## 2010-12-27 LAB — COMPREHENSIVE METABOLIC PANEL
ALT: 82 U/L — ABNORMAL HIGH (ref 0–53)
AST: 26 U/L (ref 0–37)
CO2: 23 mEq/L (ref 19–32)
Calcium: 9.5 mg/dL (ref 8.4–10.5)
Chloride: 108 mEq/L (ref 96–112)
GFR calc non Af Amer: 54 mL/min — ABNORMAL LOW (ref >90)
Sodium: 142 mEq/L (ref 135–145)

## 2010-12-27 LAB — GLUCOSE, CAPILLARY
Glucose-Capillary: 127 mg/dL — ABNORMAL HIGH (ref 70–99)
Glucose-Capillary: 120 mg/dL — ABNORMAL HIGH (ref 70–99)

## 2010-12-27 LAB — CBC
MCH: 29.9 pg (ref 26.0–34.0)
Platelets: 378 10*3/uL (ref 150–400)
RBC: 3.48 MIL/uL — ABNORMAL LOW (ref 4.22–5.81)
WBC: 13.8 10*3/uL — ABNORMAL HIGH (ref 4.0–10.5)

## 2010-12-27 MED ORDER — PANTOPRAZOLE SODIUM 40 MG PO TBEC: 40.0000 mg | DELAYED_RELEASE_TABLET | Freq: Two times a day (BID) | ORAL | Status: DC

## 2010-12-27 MED ORDER — IPRATROPIUM-ALBUTEROL 18-103 MCG/ACT IN AERO: 2.0000 | INHALATION_SPRAY | Freq: Four times a day (QID) | RESPIRATORY_TRACT | Status: DC

## 2010-12-27 MED ORDER — THERA M PLUS PO TABS: 1.0000 | ORAL_TABLET | Freq: Every day | ORAL | Status: AC

## 2010-12-27 MED ORDER — METOPROLOL TARTRATE 25 MG PO TABS: 25.0000 mg | ORAL_TABLET | Freq: Two times a day (BID) | ORAL | Status: DC

## 2010-12-27 MED ORDER — ACETAMINOPHEN 325 MG PO TABS: 650.0000 mg | ORAL_TABLET | Freq: Four times a day (QID) | ORAL | Status: AC | PRN

## 2010-12-27 MED ORDER — LORAZEPAM 0.5 MG PO TABS: 0.5000 mg | ORAL_TABLET | Freq: Four times a day (QID) | ORAL | Status: AC | PRN

## 2010-12-27 MED ORDER — OXYCODONE-ACETAMINOPHEN 5-325 MG PO TABS: 1.0000 | ORAL_TABLET | ORAL | Status: AC | PRN

## 2010-12-27 MED ORDER — METOPROLOL TARTRATE 25 MG PO TABS
25.0000 mg | ORAL_TABLET | Freq: Two times a day (BID) | ORAL | Status: DC
  Administered 2010-12-27 – 2010-12-28 (×2): 25 mg via ORAL
  Filled 2010-12-27 (×3): qty 1

## 2010-12-27 MED ORDER — IPRATROPIUM-ALBUTEROL 18-103 MCG/ACT IN AERO
2.0000 | INHALATION_SPRAY | Freq: Four times a day (QID) | RESPIRATORY_TRACT | Status: DC
  Administered 2010-12-27 – 2010-12-28 (×2): 2 via RESPIRATORY_TRACT
  Filled 2010-12-27: qty 14.7

## 2010-12-27 MED ORDER — POLYETHYLENE GLYCOL 3350 17 G PO PACK: 17.0000 g | PACK | Freq: Every day | ORAL | Status: AC | PRN

## 2010-12-27 MED ORDER — PANTOPRAZOLE SODIUM 40 MG PO TBEC
40.0000 mg | DELAYED_RELEASE_TABLET | Freq: Two times a day (BID) | ORAL | Status: DC
  Administered 2010-12-28: 40 mg via ORAL
  Filled 2010-12-27: qty 1

## 2010-12-27 NOTE — Progress Notes (Signed)
Will remove drains tomorrow.  Will d/c VAC and go home with daily wet to dry dressings.  This will be easier for mobility.  Will d/c antibiotics.  Probable discharge tomorrow or Friday.  Imogene Burn. Georgette Dover, MD, [facility] Surgery  12/27/2010 3:21 PM

## 2010-12-27 NOTE — Progress Notes (Signed)
PARENTERAL NUTRITION CONSULT NOTE - FOLLOW UP  Pharmacy Consult for TPN Indication: perforated DU s/p patch repair  No Known Allergies  Patient Measurements: Height: 5\' 5"  (165.1 cm) Weight: 204 lb 5.9 oz (92.7 kg) IBW/kg (Calculated) : 61.5   Vital Signs: Temp: 98.1 F (36.7 C) (12/12 0505) BP: 119/57 mmHg (12/12 0505) Pulse Rate: 124  (12/12 0505) Intake/Output from previous day: 12/11 0701 - 12/12 0700 In: [id number] [P.O.:240; I.V.:10; [id number] Out: 1665 [id number]; Drains:15] Intake/Output from this shift: Total I/O In: 1185.5 [IV Piggyback:150; TPN:1035.5] Out: - + 426 today, -3580 yesterday  Labs:  [facility] 12/27/10 0500 12/25/10 0713 12/24/10 1100  WBC 13.8* 13.3* 18.0*  HGB 10.4* 9.4* 10.8*  HCT 32.5* 29.8* 33.2*  PLT 378 344 396  APTT -- -- --  INR -- -- --     Basename 12/27/10 0500 12/25/10 0713  NA 142 140  K 4.0 3.7  CL 108 106  CO2 23 26  GLUCOSE 94 132*  BUN 26* 26*  CREATININE 1.38* 1.27  LABCREA -- --  CREAT24HRUR -- --  CALCIUM 9.5 9.6  MG -- 2.2  PHOS -- 3.3  PROT 6.1 6.3  ALBUMIN 2.7* 2.6*  AST 26 50*  ALT 82* 134*  ALKPHOS 239* 282*  BILITOT 0.5 0.7  BILIDIR -- --  IBILI -- --  PREALBUMIN -- 28.7  CHOLHDL -- --  CHOL -- 182   Estimated Creatinine Clearance: 58.8 ml/min (by C-G formula based on Cr of 1.38).    Basename 12/27/10 0819 12/27/10 0402 12/26/10 2337  GLUCAP 141* 127* 113*    Current Nutrition:  Full liquid diet started today  Assessment: 9 YOM s/p Ex-lap and patch repair of perforated DU on 12/16/10.  Patient started on a regular diet yesterday and reported eating at least 85% of meals.  He denies N/V/abd pain.   Plan:  1. Spoke to PA, d/c TPN after this bag.   Harry James 12/27/2010,9:47 AM

## 2010-12-27 NOTE — Discharge Summary (Signed)
Physician Discharge Summary  Patient ID: Harry James MRN: [id number] DOB/AGE: 04-27-1950 60 y.o.  Admit date: 12/15/2010 Discharge date: 12/28/2010  Admission Diagnoses: Gastric perforation with peritonitis. Chronic Type B Aortic dissection  Discharge Diagnoses:  Principal Problem:  *Perforated duodenal ulcer Active Problems:  Renal insufficiency  Respiratory failure following trauma and surgery  COPD, severity to be determined  Fx humeral neck  Hypertension  History of ETOH abuse  Dissecting aneurysm of thoracic aorta, Stanford type B  Tobacco dependence  Obesity (BMI 30-39.9) Tremors, possible withdrawal sx.  PROCEDURES: Exploratory Laparotomy with [name] Patch of Perforated Duodenal ulcer. 12-16-10 Dr. Mauricio Po.    Consults: Critical Care Medicine Dr. Jennet Maduro Orthopedics Dr. Melina Schools.  Hospital Course: Patient is a 60 year old male who developed abdominal pain 24 hours prior to admission he eventually presented to the emergency room at [facility] CT scan of the abdomen and pelvis reveals pneumoperitoneum with free fluid possible thickening of the gastric wall distally he has never had pain like this before. He states he broke his right humerus 2 weeks ago as been taking Aleve hydrocodone other medications for pain. He was seen in the emergency room. He was seen by Dr. Aviva Signs and transferred to [facility]. There was evaluated by Dr. [name] and taken to the operating room and underwent exploratory laparotomy [name] patch of perforated duodenal ulcer. His postoperative course was complicated. He required a good deal of sedation to keep him from pulling out all his lines. He had postoperative respiratory failure and pulmonary edema. He was not extubated until  12/22/2010. He had postoperative hypotension and sepsis due to his perforation. He has a history of alcohol use, and concern for post operative DTs. These were mild and  controlled with Ativan. Additional problems included also diabetes mellitus, hypertension, tobacco use, and a history of a type B aortic dissection. By 12/28/2010 he is advanced to a regular diet, so the incision had a wound VAC in the subcutaneous replace with wet-to-dry dressings. His drains have been removed. We plan to discharge him home. He will followup with Dr. Collier Salina for his fractured right humerus. Dr. Dennard Schaumann for his primary care and medical issues. He will followup with Dr. [name] in 3 weeks for surgical issues. Condition on discharge: Improved. Will [facility] physician assistant for Dr. Donnie Mesa.         Disposition:    Current Discharge Medication List    START taking these medications   Details  acetaminophen (TYLENOL) 325 MG tablet Take 2 tablets (650 mg total) by mouth every 6 (six) hours as needed. Qty: 1 tablet, Refills: 0    albuterol-ipratropium (COMBIVENT) 18-103 MCG/ACT inhaler Inhale 2 puffs into the lungs 4 (four) times daily. Qty: 1 Inhaler, Refills: 1    LORazepam (ATIVAN) 0.5 MG tablet Take 1 tablet (0.5 mg total) by mouth every 6 (six) hours as needed for anxiety. Qty: 30 tablet, Refills: 0    metoprolol tartrate (LOPRESSOR) 25 MG tablet Take 1 tablet (25 mg total) by mouth 2 (two) times daily. Qty: 60 tablet, Refills: 1    Multiple Vitamins-Minerals (MULTIVITAMINS THER. W/MINERALS) TABS Take 1 tablet by mouth daily. Qty: 30 each    oxyCODONE-acetaminophen (PERCOCET) 5-325 MG per tablet Take 1-2 tablets by mouth every 4 (four) hours as needed. Qty: 40 tablet, Refills: 0    pantoprazole (PROTONIX) 40 MG tablet Take 1 tablet (40 mg total) by mouth 2 (two) times daily before a meal. Qty: 60  tablet, Refills: 0    polyethylene glycol (MIRALAX / GLYCOLAX) packet Take 17 g by mouth daily as needed. Qty: 14 each       Follow-up Information    Follow up with [facility], MD. Make an appointment in 3 weeks. (Check abdominal  wound.  Call if you have a problem before that.)    Contact information:   [facility], [place] [phone]       Follow up with APLINGTON,JAMES P. Make an appointment in 2 days. (Call for any problems with your arm.  Get an appointment after discharge.)    Contact information:   [facility] [address], [place] [phone]       Follow up with [facility], MD. Make an appointment in 1 week. (Call for medical needs, including ulcer, lung problems, weight, ,blood pressure, and drinking problems.. You also have a problem with your aorta that needs to be followed.)    Contact information:   [address] [place]          Signed: Earnstine Regal 12/28/2010, 8:24 AM

## 2010-12-27 NOTE — Progress Notes (Signed)
Pt case discussed in LOS meeting today.

## 2010-12-27 NOTE — Progress Notes (Signed)
Patient ID: Harry James, male   DOB: [birth year], 60 y.o.   MRN: [id number] 11 Days Post-Op  Subjective: Had Eggs for breakfast, Large BM last night.  Doing pretty well with OT/PT, he has no desire to go to rehab after D/C here.  Seems fairly comfortable.  Social services and  Case Manager are beginning D/C planning. Objective: Vital signs in last 24 hours: Temp:  [98.1 F (36.7 C)-98.4 F (36.9 C)] 98.1 F (36.7 C) (12/12 0900) Pulse Rate:  [99-124] 104  (12/12 0900) Resp:  [18] 18  (12/12 0900) BP: (113-147)/(54-70) 113/54 mmHg (12/12 0900) SpO2:  [92 %-96 %] 95 % (12/12 0900) FiO2 (%):  [21 %] 21 % (12/12 0825) Last BM Date: 12/26/10  Intake/Output from previous day: 12/11 0701 - 12/12 0700 In: [id number] [P.O.:240; I.V.:10; [id number] Out: [id number]; Drains:15] Intake/Output this shift: Total I/O In: 1185.5 [IV Piggyback:150; TPN:1035.5] Out: -  He is not tremulous this AM General appearance: alert, cooperative and no distress Resp: clear to auscultation bilaterally and decreased breath sounds in bases GI: Soft, not tender, +bowel sounds, incision with wound vac in place. Clear drainage from JP's +BM, incision with wound vac.  Drains are clear. 62ml recorded yesterday.  Lab Results:   Basename 12/27/10 0500 12/25/10 0713  WBC 13.8* 13.3*  HGB 10.4* 9.4*  HCT 32.5* 29.8*  PLT 378 344    BMET  Basename 12/27/10 0500 12/25/10 0713  NA 142 140  K 4.0 3.7  CL 108 106  CO2 23 26  GLUCOSE 94 132*  BUN 26* 26*  CREATININE 1.38* 1.27  CALCIUM 9.5 9.6    Lab 12/27/10 0500 12/25/10 0713 12/21/10 0355  AST 26 50* 18  ALT 82* 134* 29  ALKPHOS 239* 282* 256*  BILITOT 0.5 0.7 1.3*  PROT 6.1 6.3 6.1  ALBUMIN 2.7* 2.6* 2.2*    PT/INR No results found for this basename: LABPROT:2,INR:2 in the last 72 hours   Studies/Results: No results found.  Anti-infectives: Anti-infectives     Start     Dose/Rate Route Frequency Ordered Stop   12/23/10 2200   piperacillin-tazobactam (ZOSYN) IVPB 3.375 g       3.375 g 12.5 mL/hr over 240 Minutes Intravenous 3 times per day 12/23/10 2032     12/20/10 1200   vancomycin (VANCOCIN) 750 mg in sodium chloride 0.9 % 150 mL IVPB  Status:  Discontinued        750 mg 150 mL/hr over 60 Minutes Intravenous Every 12 hours 12/19/10 1722 12/22/10 0938   12/17/10 1400   vancomycin (VANCOCIN) 1,250 mg in sodium chloride 0.9 % 250 mL IVPB  Status:  Discontinued        1,250 mg 166.7 mL/hr over 90 Minutes Intravenous Every 12 hours 12/17/10 1223 12/19/10 1722   12/17/10 0800   fluconazole (DIFLUCAN) IVPB 100 mg  Status:  Discontinued        100 mg 50 mL/hr over 60 Minutes Intravenous Every 24 hours 12/16/10 0334 12/22/10 0938   12/16/10 0600   piperacillin-tazobactam (ZOSYN) IVPB 3.375 g  Status:  Discontinued        3.375 g 12.5 mL/hr over 240 Minutes Intravenous 3 times per day 12/16/10 0320 12/23/10 1952   12/16/10 0500   fluconazole (DIFLUCAN) IVPB 200 mg        200 mg 100 mL/hr over 60 Minutes Intravenous  Once 12/16/10 0334 12/16/10 0613   12/15/10 2030   ampicillin-sulbactam (UNASYN) 1.5 g in sodium chloride 0.9 %  50 mL IVPB        1.5 g 100 mL/hr over 30 Minutes Intravenous  Once 12/15/10 2027 12/15/10 2127         Current Facility-Administered Medications  Medication Dose Route Frequency Provider Last Rate Last Dose  . acetaminophen (TYLENOL) tablet 650 mg  650 mg Oral Q6H PRN Earnstine Regal, PA      . antiseptic oral rinse (BIOTENE) solution 15 mL  15 mL Mouth Rinse QID [name], MD   15 mL at 12/27/10 0418  . fat emulsion 20 % infusion 240 mL  240 mL Intravenous Continuous TPN Saundra Shelling, PHARMD 10 mL/hr at 12/25/10 1825 240 mL at 12/25/10 1825  . folic acid (FOLVITE) tablet 1 mg  1 mg Oral Daily Earnstine Regal, [place]   1 mg at 12/27/10 1020  . heparin injection 5,000 Units  5,000 Units Subcutaneous Q8H [name], MD   5,000 Units at 12/27/10 0603  . insulin aspart  (novoLOG) injection 0-15 Units  0-15 Units Subcutaneous Q4H Merton Border, MD   2 Units at 12/27/10 0757  . ipratropium (ATROVENT) nebulizer solution 0.5 mg  0.5 mg Nebulization Q6H Merton Border, MD   0.5 mg at 12/27/10 0824  . levalbuterol (XOPENEX) nebulizer solution 0.63 mg  0.63 mg Nebulization Q6H Merton Border, MD   0.63 mg at 12/27/10 0824  . LORazepam (ATIVAN) injection 0-4 mg  0-4 mg Intravenous Q6H Earnstine Regal, [place]   1 mg at 12/27/10 0209  . LORazepam (ATIVAN) tablet 0.5-1 mg  0.5-1 mg Oral Q6H PRN Earnstine Regal, PA       Or  . LORazepam (ATIVAN) injection 1 mg  1 mg Intravenous Q6H PRN Earnstine Regal, PA      . metoprolol tartrate (LOPRESSOR) tablet 12.5 mg  12.5 mg Oral TID AC & HS Earnstine Regal, PA   12.5 mg at 12/27/10 0757  . morphine 2 MG/ML injection 1-3 mg  1-3 mg Intravenous Q1H PRN Earnstine Regal, PA      . multivitamins ther. w/minerals tablet 1 tablet  1 tablet Oral Daily Earnstine Regal, [place]   1 tablet at 12/27/10 1020  . oxyCODONE-acetaminophen (PERCOCET) 5-325 MG per tablet 1-2 tablet  1-2 tablet Oral Q4H PRN Earnstine Regal, PA   2 tablet at 12/26/10 2358  . pantoprazole (PROTONIX) injection 40 mg  40 mg Intravenous Q12H [name], MD   40 mg at 12/27/10 1020  . piperacillin-tazobactam (ZOSYN) IVPB 3.375 g  3.375 g Intravenous Q8H [name], MD   3.375 g at 12/27/10 0556  . pneumococcal 23 valent vaccine (PNU-IMMUNE) injection 0.5 mL  0.5 mL Intramuscular Prior to discharge [name], MD      . polyethylene glycol (MIRALAX / GLYCOLAX) packet 17 g  17 g Oral Daily Earnstine Regal, [place]   17 g at 12/27/10 1021  . sodium chloride 0.9 % injection 10 mL  10 mL Intracatheter Q12H Marissa M Cox, RN   10 mL at 12/27/10 1021  . sodium chloride 0.9 % injection 10 mL  10 mL Intracatheter PRN Carolyne Fiscal, RN   10 mL at 12/27/10 0520  . thiamine (VITAMIN B-1) tablet 100 mg  100 mg Oral Daily Earnstine Regal, [place]   100 mg at 12/27/10 1020  . tpn  solution (CLINIMIX E 5/15) 2,000 mL with insulin regular 50 Units infusion   Intravenous Continuous TPN Saundra Shelling, PHARMD 60 mL/hr at 12/26/10 1723    . tpn solution (CLINIMIX E 5/15) 2,000  mL with multivitamins adult 10 mL, trace elements Cr-Cu-Mn-Se-Zn 1 mL, insulin regular 57 Units infusion   Intravenous Continuous TPN Earnstine Regal, PA        Assessment/Plan POD10  Exploratory lap, graham patch perforated duodenal Ulcer;Hx NSAID USE;UGI 12/7-No contrast extravasation Post-Op Resp failure, Pulmonary edema;extubated 12/22/10 Post op hypotension/sepsis due to perforation Fx. R humeral head AODM Hypertension Tobacco:1ppd Etoh:1/5 per week Still on Zosyn and diflucan Obesity. Plan:He's taking a soft diet now.  Mobilizing, lungs sound good.  I will do dressing change on him later today and look at wound.  He is still on Zosyn, and WBC is still up.  Will discuss when to pull drains, and D/C antibiotics.  D/C date.  No further tremors.                                                                                  LOS: 12 days    Maripat Borba 12/27/2010

## 2010-12-27 NOTE — Progress Notes (Signed)
This patient was discussed at long LOS rounds this morning.

## 2010-12-28 LAB — GLUCOSE, CAPILLARY: Glucose-Capillary: 79 mg/dL (ref 70–99)

## 2010-12-28 MED ORDER — METOPROLOL TARTRATE 25 MG PO TABS: 25.0000 mg | ORAL_TABLET | Freq: Two times a day (BID) | ORAL | Status: DC

## 2010-12-28 NOTE — Discharge Summary (Signed)
Ready for discharge.  Harry James. Georgette Dover, MD, [facility]  12/28/2010 8:54 AM

## 2010-12-28 NOTE — Progress Notes (Signed)
   CARE MANAGEMENT NOTE 12/28/2010  Patient:  Harry James, Harry James   Account Number:  [id number]  Date Initiated:  12/19/2010  Documentation initiated by:  Sandi Mariscal  Subjective/Objective Assessment:   perforated duodenal ulcer requiring surgical repair. On vent in ICU with significant agitation noted.     Action/Plan:   Await improvement to determine pt's needs at d/c. Home HH vs SNF is best estimate now.   Anticipated DC Date:  12/28/2010   Anticipated DC Plan:  [place]  CM consult      Choice offered to / List presented to:  Patient        [place] arranged  HH-1 RN      [place].   Status of service:  Completed, signed off  Discharge Disposition:  [place]  Per UR Regulation:  Reviewed for med. necessity/level of care/duration of stay  Comments:  12/28/2010 Sandi Mariscal, RN BSN CCM 0852--Pt for d/c home today with [facility] for daily dressing changes. Confirmed address and phone number listed in EPIC is correct. Brother stays at pt's home and others will be coming in to assist as needed.

## 2010-12-28 NOTE — Progress Notes (Signed)
Patient ID: Harry James, male   DOB: [birth year], 60 y.o.   MRN: [id number] Patient ID: Harry James, male   DOB: May 27, 1950, 60 y.o.   MRN: [id number] 12 Days Post-Op  Subjective: Doing well, tolerating PO's well, + Bm, Is ready for D/C. Drains removed, minimal clear drainage.  Changed from Wound vac to wet to dry.   Objective: Vital signs in last 24 hours: Temp:  [97.7 F (36.5 C)-98.5 F (36.9 C)] 98.1 F (36.7 C) (12/13 0500) Pulse Rate:  [85-110] 85  (12/13 0500) Resp:  [18] 18  (12/13 0500) BP: (113-169)/(54-81) 151/72 mmHg (12/13 0500) SpO2:  [92 %-99 %] 99 % (12/13 0500) FiO2 (%):  [21 %] 21 % (12/12 1429) Last BM Date: 12/27/10  Intake/Output from previous day: 12/12 0701 - 12/13 0700 In: 2180.5 [P.O.:245; IV Piggyback:200; TPN:1735.5] Out: 852 [Urine:800; Drains:52] Intake/Output this shift:   He is not tremulous this AM General appearance: alert, cooperative and no distress Resp: clear to auscultation bilaterally and decreased breath sounds in bases GI: Soft, not tender, +bowel sounds, incision with wound vac in place. Clear drainage from JP's +BM, incision with wound vac.  Drains are clear. 6ml recorded yesterday. Abd. Wound looks great, going for Wet to Dry dressing for home. Lab Results:   Basename 12/27/10 0500  WBC 13.8*  HGB 10.4*  HCT 32.5*  PLT 378    BMET  Basename 12/27/10 0500  NA 142  K 4.0  CL 108  CO2 23  GLUCOSE 94  BUN 26*  CREATININE 1.38*  CALCIUM 9.5    Lab 12/27/10 0500 12/25/10 0713  AST 26 50*  ALT 82* 134*  ALKPHOS 239* 282*  BILITOT 0.5 0.7  PROT 6.1 6.3  ALBUMIN 2.7* 2.6*    PT/INR No results found for this basename: LABPROT:2,INR:2 in the last 72 hours   Studies/Results: No results found.  Anti-infectives: Anti-infectives     Start     Dose/Rate Route Frequency Ordered Stop   12/23/10 2200   piperacillin-tazobactam (ZOSYN) IVPB 3.375 g        3.375 g 12.5 mL/hr over 240 Minutes Intravenous 3 times per day  12/23/10 2032     12/20/10 1200   vancomycin (VANCOCIN) 750 mg in sodium chloride 0.9 % 150 mL IVPB  Status:  Discontinued        750 mg 150 mL/hr over 60 Minutes Intravenous Every 12 hours 12/19/10 1722 12/22/10 0938   12/17/10 1400   vancomycin (VANCOCIN) 1,250 mg in sodium chloride 0.9 % 250 mL IVPB  Status:  Discontinued        1,250 mg 166.7 mL/hr over 90 Minutes Intravenous Every 12 hours 12/17/10 1223 12/19/10 1722   12/17/10 0800   fluconazole (DIFLUCAN) IVPB 100 mg  Status:  Discontinued        100 mg 50 mL/hr over 60 Minutes Intravenous Every 24 hours 12/16/10 0334 12/22/10 0938   12/16/10 0600   piperacillin-tazobactam (ZOSYN) IVPB 3.375 g  Status:  Discontinued        3.375 g 12.5 mL/hr over 240 Minutes Intravenous 3 times per day 12/16/10 0320 12/23/10 1952   12/16/10 0500   fluconazole (DIFLUCAN) IVPB 200 mg        200 mg 100 mL/hr over 60 Minutes Intravenous  Once 12/16/10 0334 12/16/10 0613   12/15/10 2030   ampicillin-sulbactam (UNASYN) 1.5 g in sodium chloride 0.9 % 50 mL IVPB        1.5 g 100 mL/hr over  30 Minutes Intravenous  Once 12/15/10 2027 12/15/10 2127         Current Facility-Administered Medications  Medication Dose Route Frequency Provider Last Rate Last Dose  . acetaminophen (TYLENOL) tablet 650 mg  650 mg Oral Q6H PRN Earnstine Regal, PA      . albuterol-ipratropium (COMBIVENT) inhaler 2 puff  2 puff Inhalation QID Earnstine Regal, PA   2 puff at 12/27/10 2052  . antiseptic oral rinse (BIOTENE) solution 15 mL  15 mL Mouth Rinse QID [name], MD   15 mL at 12/27/10 2358  . folic acid (FOLVITE) tablet 1 mg  1 mg Oral Daily Earnstine Regal, [place]   1 mg at 12/27/10 1020  . heparin injection 5,000 Units  5,000 Units Subcutaneous Q8H [name], MD   5,000 Units at 12/28/10 0522  . insulin aspart (novoLOG) injection 0-15 Units  0-15 Units Subcutaneous Q4H Merton Border, MD   2 Units at 12/28/10 0430  . LORazepam (ATIVAN) injection 0-4 mg   0-4 mg Intravenous Q6H Earnstine Regal, [place]   1 mg at 12/27/10 0209  . LORazepam (ATIVAN) tablet 0.5-1 mg  0.5-1 mg Oral Q6H PRN Earnstine Regal, PA       Or  . LORazepam (ATIVAN) injection 1 mg  1 mg Intravenous Q6H PRN Earnstine Regal, PA      . metoprolol tartrate (LOPRESSOR) tablet 25 mg  25 mg Oral BID Earnstine Regal, [place]   25 mg at 12/27/10 2217  . morphine 2 MG/ML injection 1-3 mg  1-3 mg Intravenous Q1H PRN Earnstine Regal, PA      . multivitamins ther. w/minerals tablet 1 tablet  1 tablet Oral Daily Earnstine Regal, [place]   1 tablet at 12/27/10 1020  . oxyCODONE-acetaminophen (PERCOCET) 5-325 MG per tablet 1-2 tablet  1-2 tablet Oral Q4H PRN Earnstine Regal, PA   2 tablet at 12/27/10 1636  . pantoprazole (PROTONIX) EC tablet 40 mg  40 mg Oral BID AC Earnstine Regal, [place]      . piperacillin-tazobactam (ZOSYN) IVPB 3.375 g  3.375 g Intravenous Q8H [name], MD   3.375 g at 12/28/10 0522  . pneumococcal 23 valent vaccine (PNU-IMMUNE) injection 0.5 mL  0.5 mL Intramuscular Prior to discharge [name], MD      . polyethylene glycol (MIRALAX / GLYCOLAX) packet 17 g  17 g Oral Daily Earnstine Regal, [place]   17 g at 12/27/10 1021  . sodium chloride 0.9 % injection 10 mL  10 mL Intracatheter Q12H Marissa M Cox, RN   10 mL at 12/27/10 1021  . sodium chloride 0.9 % injection 10 mL  10 mL Intracatheter PRN Carolyne Fiscal, RN   10 mL at 12/27/10 0520  . thiamine (VITAMIN B-1) tablet 100 mg  100 mg Oral Daily Earnstine Regal, PA   100 mg at 12/27/10 1020  . DISCONTD: ipratropium (ATROVENT) nebulizer solution 0.5 mg  0.5 mg Nebulization Q6H Merton Border, MD   0.5 mg at 12/27/10 1428  . DISCONTD: levalbuterol (XOPENEX) nebulizer solution 0.63 mg  0.63 mg Nebulization Q6H Merton Border, MD   0.63 mg at 12/27/10 1427  . DISCONTD: metoprolol tartrate (LOPRESSOR) tablet 12.5 mg  12.5 mg Oral TID AC & HS Earnstine Regal, PA   12.5 mg at 12/27/10 1746  . DISCONTD: pantoprazole (PROTONIX)  injection 40 mg  40 mg Intravenous Q12H [name], MD   40 mg at 12/27/10 1020    Assessment/Plan POD11  Exploratory lap, graham patch perforated duodenal Ulcer;Hx  NSAID USE;UGI 12/7-No contrast extravasation Post-Op Resp failure, Pulmonary edema;extubated 12/22/10 Post op hypotension/sepsis due to perforation Fx. R humeral head AODM Hypertension Type B Aortic dissection, known since 2004. Tobacco:1ppd Etoh:1/5 per week Still on Zosyn and diflucan Obesity. Plan:He's taking a soft diet now.  Mobilizing, lungs sound good.Drains removed.  Changed Vac to wet to dry.  He has an appointment with Dr. Dennard Schaumann 12/28, to follow-up on all his medical isssues.  Will keep him on MDI, and BB for now.  Plan to D/C home with wet to dry dressing BID at home.                                                                              LOS: 13 days    Joeanna Howdyshell 12/28/2010

## 2010-12-28 NOTE — Progress Notes (Signed)
Ready for discharge.  Harry James. Georgette Dover, MD, [facility]  12/28/2010 8:23 AM

## 2010-12-28 NOTE — Progress Notes (Signed)
Discharge instructions/Med Rec Sheet reviewed w/ pt. Pt expressed understanding and copies given w/ prescriptions. Pt d/c'd in stable condition via w/c, accompanied by discharge volunteers 

## 2010-12-29 ENCOUNTER — Telehealth (INDEPENDENT_AMBULATORY_CARE_PROVIDER_SITE_OTHER): Payer: Self-pay | Admitting: General Surgery

## 2010-12-29 NOTE — Telephone Encounter (Signed)
I guess.  Daily dressing changes are fine though from my standpoint.

## 2010-12-29 NOTE — Telephone Encounter (Signed)
Harry James with [facility] is requesting a wound vac stating that the patient is unable to do daily dressing changes.

## 2010-12-29 NOTE — Telephone Encounter (Signed)
Returned home health nurse's message about directions on the daily dressing changes b/c the pt is not able to do dressing changes so they wanted an order to put a wound vac on the pt. Per Dr Donne Hazel he really feels the pt only needs the daily dressing changes but if pt can't do then I guess a wound vac can be ordered. I spoke to [place] who will try to talk to the pt again about doing dressing changes./ [facility]

## 2011-01-10 ENCOUNTER — Telehealth (INDEPENDENT_AMBULATORY_CARE_PROVIDER_SITE_OTHER): Payer: Self-pay | Admitting: General Surgery

## 2011-01-10 NOTE — Telephone Encounter (Signed)
Pt has a follow up appt within 3 weeks

## 2011-01-10 NOTE — Telephone Encounter (Signed)
Patient had a sx on an ulcer on 12/29/10, needs a 3wk po, please call.

## 2011-02-01 ENCOUNTER — Ambulatory Visit (INDEPENDENT_AMBULATORY_CARE_PROVIDER_SITE_OTHER): Payer: BC Managed Care – PPO | Admitting: General Surgery

## 2011-02-01 ENCOUNTER — Encounter (INDEPENDENT_AMBULATORY_CARE_PROVIDER_SITE_OTHER): Payer: Self-pay | Admitting: General Surgery

## 2011-02-01 VITALS — BP 140/82 | HR 85 | Temp 97.7°F | Ht 66.0 in | Wt 209.2 lb

## 2011-02-01 DIAGNOSIS — Z09 Encounter for follow-up examination after completed treatment for conditions other than malignant neoplasm: Secondary | ICD-10-CM

## 2011-02-01 NOTE — Progress Notes (Signed)
Subjective:     Patient ID: Harry James, male   DOB: 07-13-1950, 61 y.o.   MRN: [id number]  HPI This is a 61 year old male who I met with a perforated duodenal ulcer. I took him to the operating room and a Associate Professor. Postoperatively he did well and was discharged home. He now was at home without any significant complaints. He is eating. He is taking his proton pump inhibitors. He is having bowel movements without any difficulty.  Review of Systems     Objective:   Physical Exam Healing midline incision without infection    Assessment:     S/p [name] patch for [employer]:     He is really doing well. Her recent full activity today. He is going to continue his proton pump inhibitor he will come back and see me as needed.

## 2011-03-24 ENCOUNTER — Other Ambulatory Visit (INDEPENDENT_AMBULATORY_CARE_PROVIDER_SITE_OTHER): Payer: Self-pay | Admitting: General Surgery

## 2011-03-30 ENCOUNTER — Telehealth (INDEPENDENT_AMBULATORY_CARE_PROVIDER_SITE_OTHER): Payer: Self-pay

## 2011-03-30 NOTE — Telephone Encounter (Signed)
LM for patient to call our office regarding refill request from [facility].  Rx is for combivent inhaler which was sent home with patient from the hospital by our PA, Will Creig Hines, and will not be refilled by our office.  [facility] [id number].

## 2012-06-13 ENCOUNTER — Other Ambulatory Visit (INDEPENDENT_AMBULATORY_CARE_PROVIDER_SITE_OTHER): Payer: BC Managed Care – PPO

## 2012-06-13 DIAGNOSIS — E785 Hyperlipidemia, unspecified: Secondary | ICD-10-CM

## 2012-06-13 DIAGNOSIS — Z0000 Encounter for general adult medical examination without abnormal findings: Secondary | ICD-10-CM

## 2012-06-13 DIAGNOSIS — I10 Essential (primary) hypertension: Secondary | ICD-10-CM

## 2012-06-13 DIAGNOSIS — E119 Type 2 diabetes mellitus without complications: Secondary | ICD-10-CM

## 2012-06-13 LAB — CBC WITH DIFFERENTIAL/PLATELET
Basophils Absolute: 0 10*3/uL (ref 0.0–0.1)
Eosinophils Absolute: 0.3 10*3/uL (ref 0.0–0.7)
Lymphocytes Relative: 17 % (ref 12–46)
Lymphs Abs: 1.5 10*3/uL (ref 0.7–4.0)
MCH: 29.9 pg (ref 26.0–34.0)
Neutrophils Relative %: 75 % (ref 43–77)
Platelets: 199 10*3/uL (ref 150–400)
RBC: 5.59 MIL/uL (ref 4.22–5.81)
RDW: 14.2 % (ref 11.5–15.5)
WBC: 8.9 10*3/uL (ref 4.0–10.5)

## 2012-06-13 LAB — COMPLETE METABOLIC PANEL WITH GFR
BUN: 27 mg/dL — ABNORMAL HIGH (ref 6–23)
CO2: 27 mEq/L (ref 19–32)
Calcium: 9.4 mg/dL (ref 8.4–10.5)
Chloride: 104 mEq/L (ref 96–112)
Creat: 1.15 mg/dL (ref 0.50–1.35)
GFR, Est African American: 78 mL/min
GFR, Est Non African American: 67 mL/min
Glucose, Bld: 128 mg/dL — ABNORMAL HIGH (ref 70–99)

## 2012-06-13 LAB — LIPID PANEL: LDL Cholesterol: 47 mg/dL (ref 0–99)

## 2012-06-13 LAB — HEMOGLOBIN A1C
Hgb A1c MFr Bld: 6.1 % — ABNORMAL HIGH (ref <5.7)
Mean Plasma Glucose: 128 mg/dL — ABNORMAL HIGH (ref <117)

## 2012-06-13 LAB — TSH: TSH: 1.163 u[IU]/mL (ref 0.350–4.500)

## 2012-06-16 ENCOUNTER — Telehealth: Payer: Self-pay | Admitting: Family Medicine

## 2012-06-16 ENCOUNTER — Encounter: Payer: Self-pay | Admitting: Family Medicine

## 2012-06-16 ENCOUNTER — Ambulatory Visit (INDEPENDENT_AMBULATORY_CARE_PROVIDER_SITE_OTHER): Payer: BC Managed Care – PPO | Admitting: Family Medicine

## 2012-06-16 VITALS — BP 100/60 | HR 74 | Temp 97.7°F | Resp 20 | Wt 251.0 lb

## 2012-06-16 DIAGNOSIS — E785 Hyperlipidemia, unspecified: Secondary | ICD-10-CM

## 2012-06-16 DIAGNOSIS — I10 Essential (primary) hypertension: Secondary | ICD-10-CM

## 2012-06-16 DIAGNOSIS — Z720 Tobacco use: Secondary | ICD-10-CM | POA: Insufficient documentation

## 2012-06-16 DIAGNOSIS — IMO0002 Reserved for concepts with insufficient information to code with codable children: Secondary | ICD-10-CM | POA: Insufficient documentation

## 2012-06-16 DIAGNOSIS — E119 Type 2 diabetes mellitus without complications: Secondary | ICD-10-CM | POA: Insufficient documentation

## 2012-06-16 DIAGNOSIS — N189 Chronic kidney disease, unspecified: Secondary | ICD-10-CM | POA: Insufficient documentation

## 2012-06-16 MED ORDER — SIMVASTATIN 40 MG PO TABS: 40.0000 mg | ORAL_TABLET | Freq: Every evening | ORAL | Status: DC

## 2012-06-16 MED ORDER — IPRATROPIUM-ALBUTEROL 18-103 MCG/ACT IN AERO: 2.0000 | INHALATION_SPRAY | Freq: Four times a day (QID) | RESPIRATORY_TRACT | Status: DC

## 2012-06-16 MED ORDER — METOPROLOL TARTRATE 25 MG PO TABS: 25.0000 mg | ORAL_TABLET | Freq: Two times a day (BID) | ORAL | Status: DC

## 2012-06-16 MED ORDER — FUROSEMIDE 80 MG PO TABS: 80.0000 mg | ORAL_TABLET | Freq: Every day | ORAL | Status: DC

## 2012-06-16 MED ORDER — CLONIDINE HCL 0.3 MG PO TABS: 0.3000 mg | ORAL_TABLET | Freq: Two times a day (BID) | ORAL | Status: DC

## 2012-06-16 MED ORDER — PIOGLITAZONE HCL 15 MG PO TABS: 15.0000 mg | ORAL_TABLET | Freq: Every day | ORAL | Status: DC

## 2012-06-16 NOTE — Telephone Encounter (Signed)
No, lets try stopping and see how his BP responds.

## 2012-06-16 NOTE — Progress Notes (Signed)
Subjective:    Patient ID: Harry James, male    DOB: [birth year], 62 y.o.   MRN: [id number]  HPI Patient is here for followup of his multiple medical problems. In December 2000 he suffered a ruptured duodenal ulcer secondary to alcohol abuse. He has since successfully quit drinking alcohol. He is still smoking a third of a pack of cigarettes a day. However in the interval time he lost his job. He has gained 21 pounds due to inactivity. He is here today for followup of his hypertension, chronic kidney disease, prediabetes, dyslipidemia.  His recent labs are listed below: Lab on 06/13/2012  Component Date Value Range Status  . Sodium 06/13/2012 142  135 - 145 mEq/L Final  . Potassium 06/13/2012 4.7  3.5 - 5.3 mEq/L Final  . Chloride 06/13/2012 104  96 - 112 mEq/L Final  . CO2 06/13/2012 27  19 - 32 mEq/L Final  . Glucose, Bld 06/13/2012 128* 70 - 99 mg/dL Final  . BUN 06/13/2012 27* 6 - 23 mg/dL Final  . Creat 06/13/2012 1.15  0.50 - 1.35 mg/dL Final  . Total Bilirubin 06/13/2012 0.6  0.3 - 1.2 mg/dL Final  . Alkaline Phosphatase 06/13/2012 99  39 - 117 U/L Final  . AST 06/13/2012 17  0 - 37 U/L Final  . ALT 06/13/2012 21  0 - 53 U/L Final  . Total Protein 06/13/2012 6.9  6.0 - 8.3 g/dL Final  . Albumin 06/13/2012 4.7  3.5 - 5.2 g/dL Final  . Calcium 06/13/2012 9.4  8.4 - 10.5 mg/dL Final  . GFR, Est African American 06/13/2012 78   Final  . GFR, Est Non African American 06/13/2012 67   Final   Comment:                            The estimated GFR is a calculation valid for adults (>=22 years old)                          that uses the CKD-EPI algorithm to adjust for age and sex. It is                            not to be used for children, pregnant women, hospitalized patients,                             patients on dialysis, or with rapidly changing kidney function.                          According to the NKDEP, eGFR >89 is normal, 60-89 shows mild                           impairment, 30-59 shows moderate impairment, 15-29 shows severe                          impairment and <15 is ESRD.                             Marland Kitchen Hemoglobin A1C 06/13/2012 6.1* <5.7 % Final   Comment:  According to the ADA Clinical Practice Recommendations for 2011, when                          HbA1c is used as a screening test:                                                       >=6.5%   Diagnostic of Diabetes Mellitus                                     (if abnormal result is confirmed)                                                     5.7-6.4%   Increased risk of developing Diabetes Mellitus                                                     References:Diagnosis and Classification of Diabetes Mellitus,Diabetes                          [id number] 1):S62-S69 and Standards of Medical Care in                                  Diabetes - 2011,Diabetes [id number] (Suppl 1):S11-S61.                             . Mean Plasma Glucose 06/13/2012 128* <117 mg/dL Final  . PSA 06/13/2012 0.85  <=4.00 ng/mL Final   Comment: Test Methodology: ECLIA PSA (Electrochemiluminescence Immunoassay)                                                     For PSA values from 2.5-4.0, particularly in younger men <60 years                          old, the AUA and NCCN suggest testing for % Free PSA (3515) and                          evaluation of the rate of increase in PSA (PSA velocity).  . Cholesterol 06/13/2012 139  0 - 200 mg/dL Final   Comment: ATP III Classification:                                < 200        mg/dL        Desirable  200 - 239     mg/dL        Borderline High                               >= 240        mg/dL        High                             . Triglycerides 06/13/2012 292* <150 mg/dL Final  . HDL 06/13/2012 34* >39 mg/dL Final  . Total  CHOL/HDL Ratio 06/13/2012 4.1   Final  . VLDL 06/13/2012 58* 0 - 40 mg/dL Final  . LDL Cholesterol 06/13/2012 47  0 - 99 mg/dL Final   Comment:                            Total Cholesterol/HDL Ratio:CHD Risk                                                 Coronary Heart Disease Risk Table                                                                 Men       Women                                   1/2 Average Risk              3.4        3.3                                       Average Risk              5.0        4.4                                    2X Average Risk              9.6        7.1                                    3X Average Risk             23.4       11.0                          Use the calculated Patient Ratio above and the CHD Risk table  to determine the patient's CHD Risk.                          ATP III Classification (LDL):                                < 100        mg/dL         Optimal                               100 - 129     mg/dL         Near or Above Optimal                               130 - 159     mg/dL         Borderline High                               160 - 189     mg/dL         High                                > 190        mg/dL         Very High                             . TSH 06/13/2012 1.163  0.350 - 4.500 uIU/mL Final  . Vit D, 25-Hydroxy 06/13/2012 18* 30 - 89 ng/mL Final   Comment: This assay accurately quantifies Vitamin D, which is the sum of the                          25-Hydroxy forms of Vitamin D2 and D3.  Studies have shown that the                          optimum concentration of 25-Hydroxy Vitamin D is 30 ng/mL or higher.                           Concentrations of Vitamin D between 20 and 29 ng/mL are considered to                          be insufficient and concentrations less than 20 ng/mL are considered                          to be deficient for Vitamin D.  . WBC 06/13/2012 8.9  4.0 -  10.5 K/uL Final  . RBC 06/13/2012 5.59  4.22 - 5.81 MIL/uL Final  . Hemoglobin 06/13/2012 16.7  13.0 - 17.0 g/dL Final  . HCT 06/13/2012 49.1  39.0 - 52.0 % Final  . MCV 06/13/2012 87.8  78.0 - 100.0 fL Final  . MCH 06/13/2012 29.9  26.0 - 34.0 pg Final  . MCHC 06/13/2012 34.0  30.0 - 36.0  g/dL Final  . RDW 06/13/2012 14.2  11.5 - 15.5 % Final  . Platelets 06/13/2012 199  150 - 400 K/uL Final  . Neutrophils Relative % 06/13/2012 75  43 - 77 % Final  . Neutro Abs 06/13/2012 6.7  1.7 - 7.7 K/uL Final  . Lymphocytes Relative 06/13/2012 17  12 - 46 % Final  . Lymphs Abs 06/13/2012 1.5  0.7 - 4.0 K/uL Final  . Monocytes Relative 06/13/2012 4  3 - 12 % Final  . Monocytes Absolute 06/13/2012 0.4  0.1 - 1.0 K/uL Final  . Eosinophils Relative 06/13/2012 3  0 - 5 % Final  . Eosinophils Absolute 06/13/2012 0.3  0.0 - 0.7 K/uL Final  . Basophils Relative 06/13/2012 1  0 - 1 % Final  . Basophils Absolute 06/13/2012 0.0  0.0 - 0.1 K/uL Final  . Smear Review 06/13/2012 Criteria for review not met   Final   Past Medical History  Diagnosis Date  . Hypertension   . Hyperlipidemia   . Diabetes mellitus     prediabetes  . Ulcer     perforated duodenal ulcer 12/12  . Tobacco abuse   . Chronic kidney disease     has one functional kidney   Current Outpatient Prescriptions on File Prior to Visit  Medication Sig Dispense Refill  . Multiple Vitamins-Minerals (MULTIVITAMINS THER. W/MINERALS) TABS Take 1 tablet by mouth daily.  30 each     No current facility-administered medications on file prior to visit.   No Known Allergies History   Social History  . Marital Status: Divorced    Spouse Name: N/A    Number of Children: N/A  . Years of Education: N/A   Occupational History  . Not on file.   Social History Main Topics  . Smoking status: Current Every Day Smoker -- 1.00 packs/day    Types: Cigarettes  . Smokeless tobacco: Not on file  . Alcohol Use: Yes     Comment: daily  . Drug Use:  No  . Sexually Active:    Other Topics Concern  . Not on file   Social History Narrative  . No narrative on file      Review of Systems  All other systems reviewed and are negative.       Objective:   Physical Exam  Vitals reviewed. Constitutional: He appears well-developed and well-nourished.  HENT:  Head: Normocephalic.  Right Ear: External ear normal.  Left Ear: External ear normal.  Nose: Nose normal.  Mouth/Throat: Oropharynx is clear and moist.  Eyes: Conjunctivae are normal.  Neck: Neck supple. No JVD present. No thyromegaly present.  Cardiovascular: Normal rate, regular rhythm and normal heart sounds.  Exam reveals no gallop and no friction rub.   No murmur heard. Pulmonary/Chest: Effort normal and breath sounds normal. No respiratory distress. He has no wheezes. He has no rales.  Abdominal: Soft. Bowel sounds are normal. He exhibits no distension and no mass. There is no tenderness. There is no rebound and no guarding.  Lymphadenopathy:    He has no cervical adenopathy.  Skin: Skin is warm. No rash noted. No erythema. No pallor.  Psychiatric: He has a normal mood and affect. His behavior is normal. Judgment and thought content normal.          Assessment & Plan:  1. Type II or unspecified type diabetes mellitus without mention of complication, not stated as uncontrolled Hemoglobin A1c is 6.1. This is stable. I recommended a low carbohydrate diet,  regular daily aerobic exercise, and 15-20 pounds of gradual weight loss.  2. Essential hypertension, benign Blood pressure is at goal. I refilled his blood pressure medications. I recommended that he discontinue smoking. He stated he would give it some thought.  3. Other and unspecified hyperlipidemia Triglycerides are elevated and his good cholesterol is too low. I recommended increasing aerobic exercise to goal 5 days a week, 30 minutes a day. Also recommended a low carbohydrate diet. Recheck in 6  months.  Patient has discontinued protonix because "he is not having any problems anymore with his stomach"

## 2012-06-16 NOTE — Telephone Encounter (Signed)
Patient aware.

## 2012-06-20 ENCOUNTER — Telehealth: Payer: Self-pay | Admitting: Family Medicine

## 2012-06-20 NOTE — Telephone Encounter (Signed)
Pt stated that his lopresser was written wrong says he is to take 100mg  metoprolol BID instead we wrote out 25mg  once daily. Pt was not happy with the wrong RX written out. Left message on VM stating that I will correct the med.

## 2012-06-21 ENCOUNTER — Telehealth: Payer: Self-pay | Admitting: Family Medicine

## 2012-06-23 ENCOUNTER — Other Ambulatory Visit: Payer: Self-pay | Admitting: Family Medicine

## 2012-06-23 MED ORDER — METOPROLOL SUCCINATE ER 100 MG PO TB24: 100.0000 mg | ORAL_TABLET | Freq: Two times a day (BID) | ORAL | Status: DC

## 2012-06-23 NOTE — Telephone Encounter (Signed)
completed

## 2012-06-23 NOTE — Telephone Encounter (Signed)
Pt stated that his meds were written wrong, i looked in his chart(paper) and saw that he was taken Metoprolol 100mg  BID instead of 25mg . I spoke to Dr. Dennard Schaumann and he confirmed.

## 2012-09-06 IMAGING — CR DG CHEST 1V PORT
1 series · 1 of 1 positions shown · non-contrast
Comparison: 12/21/2010.

CLINICAL DATA: Respiratory failure.

PORTABLE CHEST - 1 VIEW

[view not recorded]
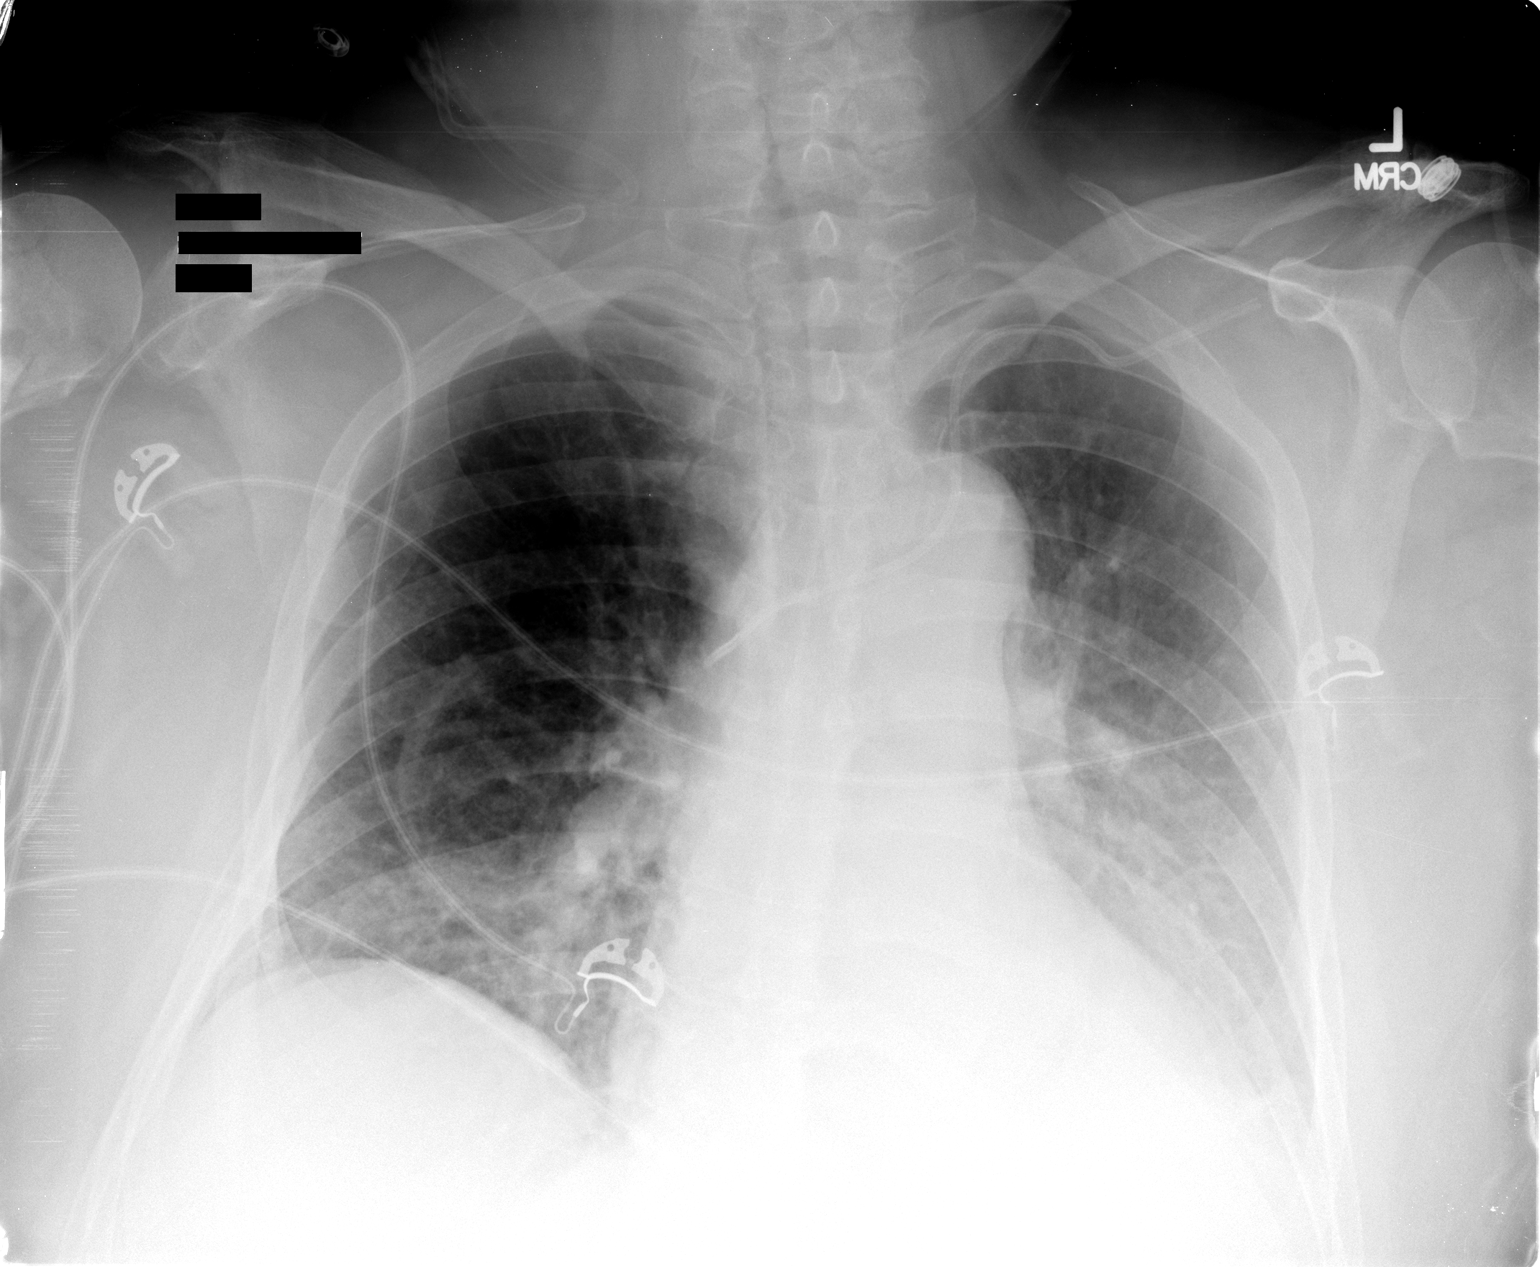

[1 of 1 positions shown; findings below may reference images not displayed]

FINDINGS: Left central line tip proximal superior vena cava
level/azygos vein level.

Nasogastric tube and endotracheal tube have been removed.

Asymmetric air space disease greater on the left.  Question
asymmetric pulmonary edema versus infiltrate with consolidation
most notable left base.

No gross pneumothorax.

Limited for detection of free intraperitoneal air.
IMPRESSION: Endotracheal tube and nasogastric tube have been removed.
Remainder of findings without significant change as noted above.

## 2012-10-12 ENCOUNTER — Other Ambulatory Visit: Payer: Self-pay | Admitting: Family Medicine

## 2012-10-15 ENCOUNTER — Other Ambulatory Visit: Payer: Self-pay | Admitting: Family Medicine

## 2012-12-12 ENCOUNTER — Other Ambulatory Visit: Payer: Self-pay | Admitting: Family Medicine

## 2012-12-12 ENCOUNTER — Encounter: Payer: Self-pay | Admitting: Family Medicine

## 2012-12-26 ENCOUNTER — Ambulatory Visit (INDEPENDENT_AMBULATORY_CARE_PROVIDER_SITE_OTHER): Payer: BC Managed Care – PPO | Admitting: Family Medicine

## 2012-12-26 ENCOUNTER — Encounter: Payer: Self-pay | Admitting: Family Medicine

## 2012-12-26 ENCOUNTER — Other Ambulatory Visit: Payer: Self-pay | Admitting: Family Medicine

## 2012-12-26 VITALS — BP 136/88 | HR 84 | Temp 96.1°F | Resp 20 | Ht 66.0 in | Wt 250.0 lb

## 2012-12-26 DIAGNOSIS — I10 Essential (primary) hypertension: Secondary | ICD-10-CM

## 2012-12-26 DIAGNOSIS — E785 Hyperlipidemia, unspecified: Secondary | ICD-10-CM

## 2012-12-26 DIAGNOSIS — E119 Type 2 diabetes mellitus without complications: Secondary | ICD-10-CM

## 2012-12-26 NOTE — Progress Notes (Signed)
Subjective:    Patient ID: Harry James, male    DOB: 04-08-1950, 62 y.o.   MRN: [id number]  HPI Patient is here today for a followup check.  Since I saw him last this summer, the patient gained approximately 25 pounds. However through extensive lifestyle and dietary changes he has successfully lost 25 pounds become extremely proud of him for his blood pressure looks excellent today at 136/88. He denies any polyuria polydipsia or blurred vision. He is not currently taking an aspirin even though he has tobacco abuse, diabetes, hypertension, hyperlipidemia. This was due to a perforated duodenal ulcer that he had possibly 3 years ago. At that time the patient was consuming substantial alcohol. He has since quit all alcohol.  There probably he would be appropriate to resume aspirin for primary prevention of cardiovascular disease.  He is also due for a flu vaccine as well as an ammonia vaccine.   Currently he is taking Actos 15 mg by mouth daily for diabetes. He has substantial swelling for which he takes Lasix. He is also battling to try to lose weight. Obviously Actos complicating each of these goals. Past Medical History  Diagnosis Date  . Hypertension   . Hyperlipidemia   . Diabetes mellitus     prediabetes  . Ulcer     perforated duodenal ulcer 12/12  . Tobacco abuse   . Chronic kidney disease     has one functional kidney   Past Surgical History  Procedure Laterality Date  . Cystectomy  under tongue  . Laparotomy  12/16/2010    [name] patch perf du Procedure: EXPLORATORY LAPAROTOMY;  Surgeon: [name], MD;  Location: [facility];  Service: General;  Laterality: N/A;   Current Outpatient Prescriptions on File Prior to Visit  Medication Sig Dispense Refill  . cloNIDine (CATAPRES) 0.3 MG tablet TAKE 1 TABLET BY MOUTH TWICE DAILY  60 tablet  0  . furosemide (LASIX) 80 MG tablet TAKE 1 TABLET BY MOUTH EVERY DAY  30 tablet  5  . Multiple Vitamins-Minerals (MULTIVITAMINS THER.  W/MINERALS) TABS Take 1 tablet by mouth daily.  30 each    . pioglitazone (ACTOS) 15 MG tablet TAKE 1 TABLET BY MOUTH EVERY DAY  30 tablet  0  . simvastatin (ZOCOR) 40 MG tablet TAKE 1 TABLET BY MOUTH EVERY EVENING  30 tablet  0   No current facility-administered medications on file prior to visit.   No Known Allergies History   Social History  . Marital Status: Divorced    Spouse Name: N/A    Number of Children: N/A  . Years of Education: N/A   Occupational History  . Not on file.   Social History Main Topics  . Smoking status: Current Every Day Smoker -- 1.00 packs/day    Types: Cigarettes  . Smokeless tobacco: Not on file  . Alcohol Use: Yes     Comment: daily  . Drug Use: No  . Sexual Activity:    Other Topics Concern  . Not on file   Social History Narrative  . No narrative on file       Review of Systems  All other systems reviewed and are negative.       Objective:   Physical Exam  Vitals reviewed. Constitutional: He is oriented to person, place, and time. He appears well-developed and well-nourished.  HENT:  Right Ear: External ear normal.  Left Ear: External ear normal.  Nose: Nose normal.  Mouth/Throat: Oropharynx is clear and moist.  No oropharyngeal exudate.  Neck: Neck supple. No thyromegaly present.  Cardiovascular: Normal rate, regular rhythm and normal heart sounds.  Exam reveals no gallop and no friction rub.   No murmur heard. Pulmonary/Chest: Effort normal and breath sounds normal. No respiratory distress. He has no wheezes. He has no rales. He exhibits no tenderness.  Abdominal: Soft. Bowel sounds are normal. He exhibits no distension and no mass. There is no tenderness. There is no rebound and no guarding.  Musculoskeletal: He exhibits edema.  Lymphadenopathy:    He has no cervical adenopathy.  Neurological: He is alert and oriented to person, place, and time. No cranial nerve deficit. Coordination normal.          Assessment &  Plan:  1. Type II or unspecified type diabetes mellitus without mention of complication, not stated as uncontrolled I am very proud of the patient losing weight. I encouraged him to continue with the lifestyle changes to try to lose an additional 50 pounds. Meanwhile discontinue Actos and switch the patient to Ivokana 100 mg by mouth daily. I am hoping this will help the patient achieve further weight loss and also not complicate the edema that he battles.  I also gave the patient his flu shot today. I will have him check with his insurance regarding coverage of an ammonia vaccine. If it is affordable I would recommend a pneumonia vaccine to this patient. I also recommended the patient begin aspirin 81 mg by mouth daily. I want him about the gastrointestinal risk of bleeding from aspirin. He is to discontinue aspirin immediately if he develops melena or any abdominal discomfort or if he resumes alcohol - COMPLETE METABOLIC PANEL WITH GFR - Hemoglobin A1c - Lipid panel  2. Essential hypertension, benign Well-controlled continue current medications. Recommend smoking cessation  3. Other and unspecified hyperlipidemia Check fasting lipid panel.  Goal LDL is less than 100

## 2012-12-27 LAB — COMPLETE METABOLIC PANEL WITH GFR
BUN: 21 mg/dL (ref 6–23)
CO2: 29 mEq/L (ref 19–32)
Creat: 1.14 mg/dL (ref 0.50–1.35)
GFR, Est African American: 79 mL/min
GFR, Est Non African American: 68 mL/min
Glucose, Bld: 108 mg/dL — ABNORMAL HIGH (ref 70–99)
Total Bilirubin: 0.7 mg/dL (ref 0.3–1.2)

## 2012-12-27 LAB — LIPID PANEL
Cholesterol: 151 mg/dL (ref 0–200)
HDL: 36 mg/dL — ABNORMAL LOW (ref >39)
Total CHOL/HDL Ratio: 4.2 Ratio
Triglycerides: 311 mg/dL — ABNORMAL HIGH (ref <150)
VLDL: 62 mg/dL — ABNORMAL HIGH (ref 0–40)

## 2012-12-27 LAB — HEMOGLOBIN A1C: Mean Plasma Glucose: 143 mg/dL — ABNORMAL HIGH (ref <117)

## 2012-12-31 ENCOUNTER — Encounter: Payer: Self-pay | Admitting: Family Medicine

## 2012-12-31 LAB — PSA: PSA: 1.2 ng/mL (ref <=4.00)

## 2013-01-01 ENCOUNTER — Encounter: Payer: Self-pay | Admitting: Family Medicine

## 2013-01-05 ENCOUNTER — Telehealth: Payer: Self-pay | Admitting: Family Medicine

## 2013-01-05 MED ORDER — CANAGLIFLOZIN 100 MG PO TABS: 100.0000 mg | ORAL_TABLET | Freq: Every day | ORAL | Status: DC

## 2013-01-05 NOTE — Telephone Encounter (Signed)
Message copied by Alyson Locket on Mon Jan 05, 2013 12:32 PM ------      Message from: [facility], Judson Roch J      Created: Wed Dec 31, 2012 11:11 AM       INVOKANA 100 MG samples worked -Needs Rx called in to [facility] ------

## 2013-01-05 NOTE — Telephone Encounter (Signed)
Med sent to pharm 

## 2013-01-06 ENCOUNTER — Other Ambulatory Visit: Payer: Self-pay | Admitting: Family Medicine

## 2013-02-03 ENCOUNTER — Telehealth: Payer: Self-pay | Admitting: Family Medicine

## 2013-02-03 DIAGNOSIS — I10 Essential (primary) hypertension: Secondary | ICD-10-CM

## 2013-02-03 MED ORDER — FUROSEMIDE 80 MG PO TABS: 80.0000 mg | ORAL_TABLET | Freq: Every day | ORAL | Status: DC

## 2013-02-03 MED ORDER — METOPROLOL TARTRATE 100 MG PO TABS: 100.0000 mg | ORAL_TABLET | Freq: Two times a day (BID) | ORAL | Status: DC

## 2013-02-03 NOTE — Telephone Encounter (Signed)
Medication refilled per protocol. 

## 2013-02-06 ENCOUNTER — Encounter: Payer: Self-pay | Admitting: Family Medicine

## 2013-02-11 ENCOUNTER — Telehealth: Payer: Self-pay | Admitting: Family Medicine

## 2013-02-11 NOTE — Telephone Encounter (Signed)
Sent PA through [email or web address] to [facility]

## 2013-02-11 NOTE — Telephone Encounter (Signed)
This encounter was created in error - please disregard.

## 2013-02-11 NOTE — Telephone Encounter (Signed)
PA has been approved 02/11/13 -02/11/18 - Faxed to pharm.

## 2013-05-01 ENCOUNTER — Other Ambulatory Visit: Payer: Self-pay | Admitting: Family Medicine

## 2013-05-01 DIAGNOSIS — I10 Essential (primary) hypertension: Secondary | ICD-10-CM

## 2013-05-01 MED ORDER — METOPROLOL TARTRATE 100 MG PO TABS: 100.0000 mg | ORAL_TABLET | Freq: Two times a day (BID) | ORAL | Status: DC

## 2013-05-01 NOTE — Telephone Encounter (Signed)
Med sent to pharm for Metoprolol succinate instead of Metoprolol XL

## 2013-05-06 ENCOUNTER — Encounter: Payer: Self-pay | Admitting: Family Medicine

## 2013-07-07 ENCOUNTER — Other Ambulatory Visit: Payer: Self-pay | Admitting: Family Medicine

## 2013-08-05 ENCOUNTER — Other Ambulatory Visit: Payer: Self-pay | Admitting: Family Medicine

## 2013-09-04 ENCOUNTER — Other Ambulatory Visit: Payer: Self-pay | Admitting: Family Medicine

## 2013-09-04 NOTE — Telephone Encounter (Signed)
Refill appropriate and filled per protocol. 

## 2013-09-28 ENCOUNTER — Other Ambulatory Visit: Payer: Self-pay | Admitting: Family Medicine

## 2013-10-23 ENCOUNTER — Other Ambulatory Visit: Payer: Self-pay | Admitting: Family Medicine

## 2013-10-23 ENCOUNTER — Encounter: Payer: Self-pay | Admitting: Family Medicine

## 2013-10-23 DIAGNOSIS — I10 Essential (primary) hypertension: Secondary | ICD-10-CM

## 2013-10-23 NOTE — Telephone Encounter (Signed)
Medication refill for one time only.  Patient needs to be seen.  Letter sent for patient to call and schedule 

## 2013-10-23 NOTE — Telephone Encounter (Signed)
90 day refill denied. Pt overdue for OV

## 2013-11-10 ENCOUNTER — Other Ambulatory Visit: Payer: 59

## 2013-11-10 DIAGNOSIS — J449 Chronic obstructive pulmonary disease, unspecified: Secondary | ICD-10-CM

## 2013-11-10 DIAGNOSIS — E785 Hyperlipidemia, unspecified: Secondary | ICD-10-CM

## 2013-11-10 DIAGNOSIS — N184 Chronic kidney disease, stage 4 (severe): Secondary | ICD-10-CM

## 2013-11-10 DIAGNOSIS — N189 Chronic kidney disease, unspecified: Secondary | ICD-10-CM

## 2013-11-10 DIAGNOSIS — I10 Essential (primary) hypertension: Secondary | ICD-10-CM

## 2013-11-10 LAB — COMPLETE METABOLIC PANEL WITH GFR
ALBUMIN: 4.6 g/dL (ref 3.5–5.2)
ALK PHOS: 128 U/L — AB (ref 39–117)
ALT: 52 U/L (ref 0–53)
AST: 31 U/L (ref 0–37)
BILIRUBIN TOTAL: 0.7 mg/dL (ref 0.2–1.2)
BUN: 25 mg/dL — ABNORMAL HIGH (ref 6–23)
CO2: 26 mEq/L (ref 19–32)
Calcium: 9.6 mg/dL (ref 8.4–10.5)
Chloride: 97 mEq/L (ref 96–112)
Creat: 1.27 mg/dL (ref 0.50–1.35)
GFR, Est African American: 69 mL/min
GFR, Est Non African American: 59 mL/min — ABNORMAL LOW
Glucose, Bld: 153 mg/dL — ABNORMAL HIGH (ref 70–99)
POTASSIUM: 3.9 meq/L (ref 3.5–5.3)
SODIUM: 136 meq/L (ref 135–145)
TOTAL PROTEIN: 7.3 g/dL (ref 6.0–8.3)

## 2013-11-10 LAB — LIPID PANEL
CHOLESTEROL: 153 mg/dL (ref 0–200)
HDL: 35 mg/dL — ABNORMAL LOW (ref >39)
Total CHOL/HDL Ratio: 4.4 Ratio
Triglycerides: 428 mg/dL — ABNORMAL HIGH (ref <150)

## 2013-11-10 LAB — HEMOGLOBIN A1C
HEMOGLOBIN A1C: 7.9 % — AB (ref <5.7)
MEAN PLASMA GLUCOSE: 180 mg/dL — AB (ref <117)

## 2013-11-10 LAB — TSH: TSH: 1.599 u[IU]/mL (ref 0.350–4.500)

## 2013-11-11 LAB — CBC WITH DIFFERENTIAL/PLATELET
Basophils Absolute: 0 10*3/uL (ref 0.0–0.1)
Basophils Relative: 0 % (ref 0–1)
Eosinophils Absolute: 0.2 10*3/uL (ref 0.0–0.7)
Eosinophils Relative: 2 % (ref 0–5)
HCT: 55.9 % — ABNORMAL HIGH (ref 39.0–52.0)
HEMOGLOBIN: 19.6 g/dL — AB (ref 13.0–17.0)
LYMPHS ABS: 1.7 10*3/uL (ref 0.7–4.0)
Lymphocytes Relative: 18 % (ref 12–46)
MCH: 29.2 pg (ref 26.0–34.0)
MCHC: 35.6 g/dL (ref 30.0–36.0)
MCV: 83.2 fL (ref 78.0–100.0)
Monocytes Absolute: 0.5 10*3/uL (ref 0.1–1.0)
Monocytes Relative: 5 % (ref 3–12)
NEUTROS ABS: 7.1 10*3/uL (ref 1.7–7.7)
NEUTROS PCT: 75 % (ref 43–77)
Platelets: 197 10*3/uL (ref 150–400)
RBC: 6.72 MIL/uL — AB (ref 4.22–5.81)
RDW: 14.2 % (ref 11.5–15.5)
WBC: 9.5 10*3/uL (ref 4.0–10.5)

## 2013-11-13 ENCOUNTER — Ambulatory Visit (INDEPENDENT_AMBULATORY_CARE_PROVIDER_SITE_OTHER): Payer: 59 | Admitting: Family Medicine

## 2013-11-13 ENCOUNTER — Encounter: Payer: Self-pay | Admitting: Family Medicine

## 2013-11-13 VITALS — BP 132/90 | HR 78 | Temp 97.6°F | Resp 22 | Ht 66.0 in | Wt 235.0 lb

## 2013-11-13 DIAGNOSIS — Z0000 Encounter for general adult medical examination without abnormal findings: Secondary | ICD-10-CM

## 2013-11-13 DIAGNOSIS — D751 Secondary polycythemia: Secondary | ICD-10-CM

## 2013-11-13 DIAGNOSIS — Z23 Encounter for immunization: Secondary | ICD-10-CM

## 2013-11-13 NOTE — Progress Notes (Signed)
Subjective:    Patient ID: Harry James, male    DOB: 1950-10-21, 63 y.o.   MRN: [id number]  HPI Patient is here today for complete physical exam. He has never had a colonoscopy and he is due for a colonoscopy but he refuses the colonoscopy. He is also due for prostate exam. He declines a digital rectal exam but he is willing to consent for a PSA. He is due for his flu shot as well as Pneumovax 23. The patient continues to smoke. His lab work as listed below. Labs are significant for hemoglobin of 19.6, hemoglobin A1c of 7.9, triglycerides greater than 400. Lab on 11/10/2013  Component Date Value Ref Range Status  . Cholesterol 11/10/2013 153  0 - 200 mg/dL Final   Comment: ATP III Classification:                                < 200        mg/dL        Desirable                               200 - 239     mg/dL        Borderline High                               >= 240        mg/dL        High                             . Triglycerides 11/10/2013 428* <150 mg/dL Final  . HDL 11/10/2013 35* >39 mg/dL Final  . Total CHOL/HDL Ratio 11/10/2013 4.4   Final  . VLDL 11/10/2013 NOT CALC  0 - 40 mg/dL Final   Comment:                            Not calculated due to Triglyceride >400.                          Suggest ordering Direct LDL (Unit Code: [phone]).  . LDL Cholesterol 11/10/2013 NOT CALC  0 - 99 mg/dL Final   Comment:                            Not calculated due to Triglyceride >400.                          Suggest ordering Direct LDL (Unit Code: [phone]).                                                     Total Cholesterol/HDL Ratio:CHD Risk                                                 Coronary  Heart Disease Risk Table                                                                 Men       Women                                   1/2 Average Risk              3.4        3.3                                       Average Risk              5.0        4.4               2X Average Risk              9.6        7.1                                    3X Average Risk             23.4       11.0                          Use the calculated Patient Ratio above and the CHD Risk table                           to determine the patient's CHD Risk.                          ATP III Classification (LDL):                                < 100        mg/dL         Optimal                               100 - 129     mg/dL         Near or Above Optimal                               130 - 159     mg/dL         Borderline High                               160 - 189     mg/dL         High                                >  190        mg/dL         Very High                             . WBC 11/10/2013 9.5  4.0 - 10.5 K/uL Final  . RBC 11/10/2013 6.72* 4.22 - 5.81 MIL/uL Final  . Hemoglobin 11/10/2013 19.6* 13.0 - 17.0 g/dL Final   Result repeated and verified.  Marland Kitchen HCT 11/10/2013 55.9* 39.0 - 52.0 % Final  . MCV 11/10/2013 83.2  78.0 - 100.0 fL Final  . MCH 11/10/2013 29.2  26.0 - 34.0 pg Final  . MCHC 11/10/2013 35.6  30.0 - 36.0 g/dL Final  . RDW 11/10/2013 14.2  11.5 - 15.5 % Final  . Platelets 11/10/2013 197  150 - 400 K/uL Final  . Neutrophils Relative % 11/10/2013 75  43 - 77 % Final  . Neutro Abs 11/10/2013 7.1  1.7 - 7.7 K/uL Final  . Lymphocytes Relative 11/10/2013 18  12 - 46 % Final  . Lymphs Abs 11/10/2013 1.7  0.7 - 4.0 K/uL Final  . Monocytes Relative 11/10/2013 5  3 - 12 % Final  . Monocytes Absolute 11/10/2013 0.5  0.1 - 1.0 K/uL Final  . Eosinophils Relative 11/10/2013 2  0 - 5 % Final  . Eosinophils Absolute 11/10/2013 0.2  0.0 - 0.7 K/uL Final  . Basophils Relative 11/10/2013 0  0 - 1 % Final  . Basophils Absolute 11/10/2013 0.0  0.0 - 0.1 K/uL Final  . Smear Review 11/10/2013 Criteria for review not met   Final  . Hemoglobin A1C 11/10/2013 7.9* <5.7 % Final   Comment:                                                                                                  According to the ADA Clinical Practice Recommendations for 2011, when                          HbA1c is used as a screening test:                                                       >=6.5%   Diagnostic of Diabetes Mellitus                                     (if abnormal result is confirmed)                                                     5.7-6.4%   Increased risk of developing Diabetes Mellitus  References:Diagnosis and Classification of Diabetes Mellitus,Diabetes                          HYQM,5784,69(GEXBM 1):S62-S69 and Standards of Medical Care in                                  Diabetes - 2011,Diabetes WUXL,2440,10 (Suppl 1):S11-S61.                             . Mean Plasma Glucose 11/10/2013 180* <117 mg/dL Final  . TSH 11/10/2013 1.599  0.350 - 4.500 uIU/mL Final  . Sodium 11/10/2013 136  135 - 145 mEq/L Final  . Potassium 11/10/2013 3.9  3.5 - 5.3 mEq/L Final  . Chloride 11/10/2013 97  96 - 112 mEq/L Final  . CO2 11/10/2013 26  19 - 32 mEq/L Final  . Glucose, Bld 11/10/2013 153* 70 - 99 mg/dL Final  . BUN 11/10/2013 25* 6 - 23 mg/dL Final  . Creat 11/10/2013 1.27  0.50 - 1.35 mg/dL Final  . Total Bilirubin 11/10/2013 0.7  0.2 - 1.2 mg/dL Final  . Alkaline Phosphatase 11/10/2013 128* 39 - 117 U/L Final  . AST 11/10/2013 31  0 - 37 U/L Final  . ALT 11/10/2013 52  0 - 53 U/L Final  . Total Protein 11/10/2013 7.3  6.0 - 8.3 g/dL Final  . Albumin 11/10/2013 4.6  3.5 - 5.2 g/dL Final  . Calcium 11/10/2013 9.6  8.4 - 10.5 mg/dL Final  . GFR, Est African American 11/10/2013 69   Final  . GFR, Est Non African American 11/10/2013 59*  Final   Comment:                            The estimated GFR is a calculation valid for adults (>=42 years old)                          that uses the CKD-EPI algorithm to adjust for age and sex. It is                            not to be used for children, pregnant women,  hospitalized patients,                             patients on dialysis, or with rapidly changing kidney function.                          According to the NKDEP, eGFR >89 is normal, 60-89 shows mild                          impairment, 30-59 shows moderate impairment, 15-29 shows severe                          impairment and <15 is ESRD.                              Past Medical History  Diagnosis Date  . Hypertension   . Hyperlipidemia   .  Diabetes mellitus     prediabetes  . Ulcer     perforated duodenal ulcer 12/12  . Tobacco abuse   . Chronic kidney disease     has one functional kidney   Past Surgical History  Procedure Laterality Date  . Cystectomy  under tongue  . Laparotomy  12/16/2010    [name] patch perf du Procedure: EXPLORATORY LAPAROTOMY;  Surgeon: [name], MD;  Location: [facility];  Service: General;  Laterality: N/A;   Current Outpatient Prescriptions on File Prior to Visit  Medication Sig Dispense Refill  . cloNIDine (CATAPRES) 0.3 MG tablet TAKE 1 TABLET BY MOUTH TWICE DAILY  60 tablet  11  . Fish Oil OIL by Does not apply route. 2 po qhs      . furosemide (LASIX) 80 MG tablet TAKE 1 TABLET BY MOUTH EVERY DAY  30 tablet  5  . INVOKANA 100 MG TABS TAKE 1 TABLET BY MOUTH EVERY DAY  30 tablet  3  . metoprolol (LOPRESSOR) 100 MG tablet TAKE 1 TABLET BY MOUTH TWICE DAILY  60 tablet  0  . Multiple Vitamins-Minerals (MULTIVITAMINS THER. W/MINERALS) TABS Take 1 tablet by mouth daily.  30 each    . pioglitazone (ACTOS) 15 MG tablet TAKE 1 TABLET BY MOUTH EVERY DAY  30 tablet  5  . simvastatin (ZOCOR) 40 MG tablet TAKE 1 TABLET BY MOUTH EVERY EVENING  30 tablet  11   No current facility-administered medications on file prior to visit.   No Known Allergies History   Social History  . Marital Status: Divorced    Spouse Name: N/A    Number of Children: N/A  . Years of Education: N/A   Occupational History  . Not on file.   Social History Main Topics  .  Smoking status: Current Every Day Smoker -- 1.00 packs/day    Types: Cigarettes  . Smokeless tobacco: Not on file  . Alcohol Use: Yes     Comment: daily  . Drug Use: No  . Sexual Activity:    Other Topics Concern  . Not on file   Social History Narrative  . No narrative on file   No family history on file.    Review of Systems  All other systems reviewed and are negative.      Objective:   Physical Exam  Vitals reviewed. Constitutional: He is oriented to person, place, and time. He appears well-developed and well-nourished. No distress.  HENT:  Head: Normocephalic and atraumatic.  Right Ear: External ear normal.  Left Ear: External ear normal.  Nose: Nose normal.  Mouth/Throat: Oropharynx is clear and moist. No oropharyngeal exudate.  Eyes: Conjunctivae and EOM are normal. Pupils are equal, round, and reactive to light. Right eye exhibits no discharge. Left eye exhibits no discharge. No scleral icterus.  Neck: Normal range of motion. Neck supple. No JVD present. No tracheal deviation present. No thyromegaly present.  Cardiovascular: Normal rate, regular rhythm, normal heart sounds and intact distal pulses.  Exam reveals no gallop and no friction rub.   No murmur heard. Pulmonary/Chest: Effort normal and breath sounds normal. No stridor. No respiratory distress. He has no wheezes. He has no rales. He exhibits no tenderness.  Abdominal: Soft. Bowel sounds are normal. He exhibits no distension and no mass. There is no tenderness. There is no rebound and no guarding.  Musculoskeletal: Normal range of motion. He exhibits no edema and no tenderness.  Lymphadenopathy:    He has no cervical adenopathy.  Neurological: He is alert and oriented to person, place, and time. He has normal reflexes. He displays normal reflexes. No cranial nerve deficit. He exhibits normal muscle tone. Coordination normal.  Skin: Skin is warm. No rash noted. He is not diaphoretic. No erythema. No pallor.    Psychiatric: He has a normal mood and affect. His behavior is normal. Judgment and thought content normal.          Assessment & Plan:  Routine general medical examination at a health care facility - Plan: PSA  Polycythemia - Plan: CBC with Differential, Ambulatory referral to Hematology  Patient received his flu shot today along with Pneumovax 23. I am very concerned by his hemoglobin. 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 Therefore I'm concerned about polycythemia vera. I will verify his hemoglobin with a repeat CBC and then consult hematology. I've also recommended smoking cessation. I recommended colonoscopy but the patient refused. I recommended a digital rectal exam but the patient refused. He is willing to consent to a PSA. I will increase the patient's invokana to 300 mg a day due to his elevated hemoglobin A1c and also resume Actos 15 mg by mouth daily. I have recommended diet exercise and weight loss to address his elevated triglycerides. His blood pressure is borderline. Recheck in 3 months

## 2013-11-14 LAB — CBC WITH DIFFERENTIAL/PLATELET
Basophils Absolute: 0 10*3/uL (ref 0.0–0.1)
Basophils Relative: 0 % (ref 0–1)
EOS ABS: 0.2 10*3/uL (ref 0.0–0.7)
Eosinophils Relative: 2 % (ref 0–5)
HEMATOCRIT: 55.7 % — AB (ref 39.0–52.0)
HEMOGLOBIN: 22.9 g/dL — AB (ref 13.0–17.0)
LYMPHS ABS: 1.8 10*3/uL (ref 0.7–4.0)
Lymphocytes Relative: 16 % (ref 12–46)
MCH: 34.6 pg — AB (ref 26.0–34.0)
MCHC: 35.4 g/dL (ref 30.0–36.0)
MCV: 84.1 fL (ref 78.0–100.0)
MONO ABS: 0.5 10*3/uL (ref 0.1–1.0)
MONOS PCT: 4 % (ref 3–12)
Neutro Abs: 9 10*3/uL — ABNORMAL HIGH (ref 1.7–7.7)
Neutrophils Relative %: 78 % — ABNORMAL HIGH (ref 43–77)
Platelets: 199 10*3/uL (ref 150–400)
RBC: 6.62 MIL/uL — AB (ref 4.22–5.81)
RDW: 14.4 % (ref 11.5–15.5)
WBC: 11.5 10*3/uL — ABNORMAL HIGH (ref 4.0–10.5)

## 2013-11-14 LAB — PSA: PSA: 1.15 ng/mL (ref <=4.00)

## 2013-11-18 ENCOUNTER — Other Ambulatory Visit: Payer: Self-pay | Admitting: Family Medicine

## 2013-11-18 MED ORDER — CANAGLIFLOZIN 300 MG PO TABS: 300.0000 mg | ORAL_TABLET | Freq: Every day | ORAL | Status: DC

## 2013-11-23 ENCOUNTER — Other Ambulatory Visit: Payer: Self-pay | Admitting: Family Medicine

## 2013-11-27 ENCOUNTER — Encounter (HOSPITAL_COMMUNITY): Payer: 59 | Attending: Hematology and Oncology

## 2013-11-27 ENCOUNTER — Inpatient Hospital Stay (HOSPITAL_COMMUNITY): Admission: RE | Admit: 2013-11-27 | Payer: 59 | Source: Ambulatory Visit

## 2013-11-27 ENCOUNTER — Encounter (HOSPITAL_COMMUNITY): Payer: Self-pay

## 2013-11-27 VITALS — BP 126/75 | HR 70 | Temp 97.5°F | Resp 20 | Ht 65.5 in | Wt 236.2 lb

## 2013-11-27 DIAGNOSIS — E785 Hyperlipidemia, unspecified: Secondary | ICD-10-CM

## 2013-11-27 DIAGNOSIS — Z8589 Personal history of malignant neoplasm of other organs and systems: Secondary | ICD-10-CM | POA: Diagnosis not present

## 2013-11-27 DIAGNOSIS — J449 Chronic obstructive pulmonary disease, unspecified: Secondary | ICD-10-CM

## 2013-11-27 DIAGNOSIS — I714 Abdominal aortic aneurysm, without rupture, unspecified: Secondary | ICD-10-CM

## 2013-11-27 DIAGNOSIS — D751 Secondary polycythemia: Secondary | ICD-10-CM | POA: Diagnosis present

## 2013-11-27 DIAGNOSIS — I10 Essential (primary) hypertension: Secondary | ICD-10-CM

## 2013-11-27 DIAGNOSIS — E119 Type 2 diabetes mellitus without complications: Secondary | ICD-10-CM

## 2013-11-27 DIAGNOSIS — J438 Other emphysema: Secondary | ICD-10-CM

## 2013-11-27 LAB — CBC WITH DIFFERENTIAL/PLATELET
BASOS ABS: 0.1 10*3/uL (ref 0.0–0.1)
BASOS PCT: 1 % (ref 0–1)
EOS PCT: 2 % (ref 0–5)
Eosinophils Absolute: 0.2 10*3/uL (ref 0.0–0.7)
HCT: 56.3 % — ABNORMAL HIGH (ref 39.0–52.0)
Hemoglobin: 19.3 g/dL — ABNORMAL HIGH (ref 13.0–17.0)
Lymphocytes Relative: 19 % (ref 12–46)
Lymphs Abs: 1.9 10*3/uL (ref 0.7–4.0)
MCH: 30.2 pg (ref 26.0–34.0)
MCHC: 34.3 g/dL (ref 30.0–36.0)
MCV: 88.1 fL (ref 78.0–100.0)
Monocytes Absolute: 0.4 10*3/uL (ref 0.1–1.0)
Monocytes Relative: 4 % (ref 3–12)
Neutro Abs: 7.5 10*3/uL (ref 1.7–7.7)
Neutrophils Relative %: 74 % (ref 43–77)
PLATELETS: 196 10*3/uL (ref 150–400)
RBC: 6.39 MIL/uL — ABNORMAL HIGH (ref 4.22–5.81)
RDW: 13.6 % (ref 11.5–15.5)
WBC: 10.1 10*3/uL (ref 4.0–10.5)

## 2013-11-27 LAB — COMPREHENSIVE METABOLIC PANEL
ALT: 42 U/L (ref 0–53)
AST: 26 U/L (ref 0–37)
Albumin: 3.9 g/dL (ref 3.5–5.2)
Alkaline Phosphatase: 130 U/L — ABNORMAL HIGH (ref 39–117)
Anion gap: 13 (ref 5–15)
BUN: 33 mg/dL — ABNORMAL HIGH (ref 6–23)
CALCIUM: 9.5 mg/dL (ref 8.4–10.5)
CO2: 29 mEq/L (ref 19–32)
CREATININE: 1.22 mg/dL (ref 0.50–1.35)
Chloride: 98 mEq/L (ref 96–112)
GFR calc Af Amer: 71 mL/min — ABNORMAL LOW (ref >90)
GFR calc non Af Amer: 61 mL/min — ABNORMAL LOW (ref >90)
GLUCOSE: 199 mg/dL — AB (ref 70–99)
Potassium: 4.1 mEq/L (ref 3.7–5.3)
Sodium: 140 mEq/L (ref 137–147)
Total Bilirubin: 0.5 mg/dL (ref 0.3–1.2)
Total Protein: 7.5 g/dL (ref 6.0–8.3)

## 2013-11-27 LAB — RETICULOCYTES
RBC.: 6.39 MIL/uL — ABNORMAL HIGH (ref 4.22–5.81)
Retic Count, Absolute: 95.9 10*3/uL (ref 19.0–186.0)
Retic Ct Pct: 1.5 % (ref 0.4–3.1)

## 2013-11-27 LAB — CARBOXYHEMOGLOBIN
CARBOXYHEMOGLOBIN: 4.9 % — AB (ref 0.5–1.5)
Methemoglobin: 1.2 % (ref 0.0–1.5)
O2 SAT: 72.9 %
TOTAL OXYGEN CONTENT: 19.3 mL/dL (ref 15.0–23.0)
Total hemoglobin: 20.2 g/dL (ref 13.5–18.0)

## 2013-11-27 LAB — LACTATE DEHYDROGENASE: LDH: 209 U/L (ref 94–250)

## 2013-11-27 NOTE — Patient Instructions (Signed)
[facility] Discharge Instructions  RECOMMENDATIONS MADE BY THE CONSULTANT AND ANY TEST RESULTS WILL BE SENT TO YOUR REFERRING PHYSICIAN.  EXAM FINDINGS BY THE PHYSICIAN TODAY AND SIGNS OR SYMPTOMS TO REPORT TO CLINIC OR PRIMARY PHYSICIAN: Exam and findings as discussed by Dr. Barnet Glasgow.  We need to check some labs today and will be in touch with you next week.  You will probably need phlebotomies performed to decrease your hemoglobin.   INSTRUCTIONS/FOLLOW-UP: Phlebotomies to be arranged  Follow-up in 8 weeks with labs and office visit.  Thank you for choosing [facility] to provide your oncology and hematology care.  To afford each patient quality time with our providers, please arrive at least 15 minutes before your scheduled appointment time.  With your help, our goal is to use those 15 minutes to complete the necessary work-up to ensure our physicians have the information they need to help with your evaluation and healthcare recommendations.    Effective January 1st, 2014, we ask that you re-schedule your appointment with our physicians should you arrive 10 or more minutes late for your appointment.  We strive to give you quality time with our providers, and arriving late affects you and other patients whose appointments are after yours.    Again, thank you for choosing [facility].  Our hope is that these requests will decrease the amount of time that you wait before being seen by our physicians.       _____________________________________________________________  Should you have questions after your visit to [facility], please contact our office at [phone] between the hours of 8:30 a.m. and 4:30 p.m.  Voicemails left after 4:30 p.m. will not be returned until the following business day.  For prescription refill requests, have your pharmacy contact our office with your prescription refill request.     _______________________________________________________________  We hope that we have given you very good care.  You may receive a patient satisfaction survey in the mail, please complete it and return it as soon as possible.  We value your feedback!  _______________________________________________________________  Have you asked about our STAR program?  STAR stands for Survivorship Training and Rehabilitation, and this is a nationally recognized cancer care program that focuses on survivorship and rehabilitation.  Cancer and cancer treatments may cause problems, such as, pain, making you feel tired and keeping you from doing the things that you need or want to do. Cancer rehabilitation can help. Our goal is to reduce these troubling effects and help you have the best quality of life possible.  You may receive a survey from a nurse that asks questions about your current state of health.  Based on the survey results, all eligible patients will be referred to the [facility] program for an evaluation so we can better serve you!  A frequently asked questions sheet is available upon request.  Polycythemia Vera  Polycythemia Harry James is a condition in which the body makes too many red blood cells and there is no known cause. The red blood cells (erythrocytes) are the cells which carry the oxygen in your blood stream to the cells of your body. Because of the increased red blood cells, the blood becomes thicker and does not circulate as well. It would be similar to your car having oil which is too thick so it cannot start and circulate as well. When the blood is too thick it often causes headaches and dizziness. It may also cause blood  clots. Even though the blood clots easier, these patients bleed easier. The bleeding is caused because the blood cells which help stop bleeding (platelets) do not function normally. It occurs in all age groups but is more common in the 4 to 96 year age range. TREATMENT  The  treatment of polycythemia vera for many years has been blood removal (phlebotomy) which is similar to blood removal in a blood bank, however this blood is not used for donation. Hydroxyurea is used to supplement phlebotomy. Aspirin is commonly given to thin the blood as long as the patient does not have a problem with bleeding. Other drugs are used based on the progression of the disease. Document Released: 09/26/2000 Document Revised: 03/26/2011 Document Reviewed: 04/02/2008 [facility] Patient Information 2015 [place], [place]. This information is not intended to replace advice given to you by your health care provider. Make sure you discuss any questions you have with your health care provider.

## 2013-11-27 NOTE — Progress Notes (Signed)
Harry James, M.D.  NEW PATIENT EVALUATION   Name: Harry James Date: 11/28/2013 MRN: [id number] DOB: 11-Jan-1951  PCP: Harry Fraction, MD   REFERRING PHYSICIAN: Susy Frizzle, MD  REASON FOR REFERRAL: Polycythemia     HISTORY OF PRESENT ILLNESS:Harry James is a 63 y.o. male who is referred by his family physician because of worsening polycythemia over the past 3 years from a hemoglobin of 9.4 on 12/25/2010 to 22.9 on 11/13/2013. In 2012 he had suffered from a bleeding duodenal ulcer and underwent surgery. Since then his hemoglobin has been slowly rising. He has been told that he has facial flushing and has noticed bluish discoloration both upper extremities and lower extremities. He denies any chest pain, PND, orthopnea, or palpitations but does get short of breath on exertion. He denies easy satiety, nausea, vomiting, diarrhea, constipation, melena, hematochezia, hematuria, erectile dysfunction, epistaxis, hemoptysis, skin rash, frequent headaches, pruritus, or seizures.  PAST MEDICAL HISTORY:  has a past medical history of Hypertension; Hyperlipidemia; Diabetes mellitus; Ulcer; Tobacco abuse; and Chronic kidney disease.     PAST SURGICAL HISTORY: Past Surgical History  Procedure Laterality Date  . Cystectomy  under tongue  . Laparotomy  12/16/2010    [name] patch perf du Procedure: EXPLORATORY LAPAROTOMY;  Surgeon: [name], MD;  Location: [facility];  Service: General;  Laterality: N/A;     CURRENT MEDICATIONS: has a current medication list which includes the following prescription(s): canagliflozin, clonidine, fish oil, furosemide, metoprolol, simvastatin, multivitamins ther. w/minerals, and pioglitazone.   ALLERGIES: Review of patient's allergies indicates no known allergies.   SOCIAL HISTORY:  reports that he has been smoking Cigarettes.  He has been smoking about 1.00 pack per day. He has never  used smokeless tobacco. He reports that he drinks alcohol. He reports that he does not use illicit drugs.   FAMILY HISTORY: family history is not on file.    REVIEW OF SYSTEMS:  Other than that discussed above is noncontributory.    PHYSICAL EXAM:  height is 5' 5.5" (1.664 m) and weight is 236 lb 3.2 oz (107.14 kg). His oral temperature is 97.5 F (36.4 C). His blood pressure is 126/75 and his pulse is 70. His respiration is 20 and oxygen saturation is 96%.    GENERAL:alert, no distress and comfortable. Morbidly obese.plethora complexion. SKIN: skin  texture, turgor are normal, no rashes or significant lesions. Lewis discoloration of hands and feet. EYES: normal, bilateral conjunctival injection, sclera nonicteric. OROPHARYNX:no exudate, no erythema and lips, buccal mucosa, and tongue normal  NECK: supple, thyroid normal size, non-tender, without nodularity CHEST: increased AP diameter with fatty gynecomastia. LYMPH:  no palpable lymphadenopathy in the cervical, axillary or inguinal LUNGS: clear to auscultation and percussion with normal breathing effort HEART: regular rate & rhythm and no murmurs ABDOMEN:abdomen soft, non-tender and normal bowel sounds. Obese and soft with no appreciable organomegaly. MUSCULOSKELETALl:no cyanosis of digits, no clubbing or edema  NEURO: alert & oriented x 3 with fluent speech, no focal motor/sensory deficits    LABORATORY DATA:  Results for Harry, James (MRN [id number]) as of 11/27/2013 15:19  Ref. Range 12/25/2010 07:13 12/27/2010 05:00 06/13/2012 12:00 11/10/2013 11:09 11/13/2013 15:23  Hemoglobin Latest Range: 13.0-17.0 g/dL 9.4 (L) 10.4 (L) 16.7 19.6 (H) 22.9 (H)      Office Visit on 11/27/2013  Component Date Value Ref Range Status  . Total hemoglobin 11/27/2013 20.2* 13.5 - 18.0 g/dL Final  Comment: CRITICAL RESULT CALLED TO, READ BACK BY AND VERIFIED WITH: KAREN BURGESS,RN AT [place] [facility],RRT,RCP ON 11/27/2013   . O2  Saturation 11/27/2013 72.9   Final  . Carboxyhemoglobin 11/27/2013 4.9* 0.5 - 1.5 % Final  . Methemoglobin 11/27/2013 1.2  0.0 - 1.5 % Final  . Total oxygen content 11/27/2013 19.3  15.0 - 23.0 mL/dL Final  . WBC 11/27/2013 10.1  4.0 - 10.5 K/uL Final  . RBC 11/27/2013 6.39* 4.22 - 5.81 MIL/uL Final  . Hemoglobin 11/27/2013 19.3* 13.0 - 17.0 g/dL Final  . HCT 11/27/2013 56.3* 39.0 - 52.0 % Final  . MCV 11/27/2013 88.1  78.0 - 100.0 fL Final  . MCH 11/27/2013 30.2  26.0 - 34.0 pg Final  . MCHC 11/27/2013 34.3  30.0 - 36.0 g/dL Final  . RDW 11/27/2013 13.6  11.5 - 15.5 % Final  . Platelets 11/27/2013 196  150 - 400 K/uL Final  . Neutrophils Relative % 11/27/2013 74  43 - 77 % Final  . Neutro Abs 11/27/2013 7.5  1.7 - 7.7 K/uL Final  . Lymphocytes Relative 11/27/2013 19  12 - 46 % Final  . Lymphs Abs 11/27/2013 1.9  0.7 - 4.0 K/uL Final  . Monocytes Relative 11/27/2013 4  3 - 12 % Final  . Monocytes Absolute 11/27/2013 0.4  0.1 - 1.0 K/uL Final  . Eosinophils Relative 11/27/2013 2  0 - 5 % Final  . Eosinophils Absolute 11/27/2013 0.2  0.0 - 0.7 K/uL Final  . Basophils Relative 11/27/2013 1  0 - 1 % Final  . Basophils Absolute 11/27/2013 0.1  0.0 - 0.1 K/uL Final  . Retic Ct Pct 11/27/2013 1.5  0.4 - 3.1 % Final  . RBC. 11/27/2013 6.39* 4.22 - 5.81 MIL/uL Final  . Retic Count, Manual 11/27/2013 95.9  19.0 - 186.0 K/uL Final  . Sodium 11/27/2013 140  137 - 147 mEq/L Final  . Potassium 11/27/2013 4.1  3.7 - 5.3 mEq/L Final  . Chloride 11/27/2013 98  96 - 112 mEq/L Final  . CO2 11/27/2013 29  19 - 32 mEq/L Final  . Glucose, Bld 11/27/2013 199* 70 - 99 mg/dL Final  . BUN 11/27/2013 33* 6 - 23 mg/dL Final  . Creatinine, Ser 11/27/2013 1.22  0.50 - 1.35 mg/dL Final  . Calcium 11/27/2013 9.5  8.4 - 10.5 mg/dL Final  . Total Protein 11/27/2013 7.5  6.0 - 8.3 g/dL Final  . Albumin 11/27/2013 3.9  3.5 - 5.2 g/dL Final  . AST 11/27/2013 26  0 - 37 U/L Final  . ALT 11/27/2013 42  0 - 53 U/L  Final  . Alkaline Phosphatase 11/27/2013 130* 39 - 117 U/L Final  . Total Bilirubin 11/27/2013 0.5  0.3 - 1.2 mg/dL Final  . GFR calc non Af Amer 11/27/2013 61* >90 mL/min Final  . GFR calc Af Amer 11/27/2013 71* >90 mL/min Final   Comment: (NOTE) The eGFR has been calculated using the CKD EPI equation. This calculation has not been validated in all clinical situations. eGFR's persistently <90 mL/min signify possible Chronic Kidney Disease.   . Anion gap 11/27/2013 13  5 - 15 Final  . LDH 11/27/2013 209  94 - 250 U/L Final   SLIGHT HEMOLYSIS  Office Visit on 11/13/2013  Component Date Value Ref Range Status  . WBC 11/13/2013 11.5* 4.0 - 10.5 K/uL Final  . RBC 11/13/2013 6.62* 4.22 - 5.81 MIL/uL Final  . Hemoglobin 11/13/2013 22.9* 13.0 - 17.0 g/dL Final  Result repeated and verified.  Marland Kitchen HCT 11/13/2013 55.7* 39.0 - 52.0 % Final  . MCV 11/13/2013 84.1  78.0 - 100.0 fL Final  . MCH 11/13/2013 34.6* 26.0 - 34.0 pg Final  . MCHC 11/13/2013 35.4  30.0 - 36.0 g/dL Final  . RDW 11/13/2013 14.4  11.5 - 15.5 % Final  . Platelets 11/13/2013 199  150 - 400 K/uL Final  . Neutrophils Relative % 11/13/2013 78* 43 - 77 % Final  . Neutro Abs 11/13/2013 9.0* 1.7 - 7.7 K/uL Final  . Lymphocytes Relative 11/13/2013 16  12 - 46 % Final  . Lymphs Abs 11/13/2013 1.8  0.7 - 4.0 K/uL Final  . Monocytes Relative 11/13/2013 4  3 - 12 % Final  . Monocytes Absolute 11/13/2013 0.5  0.1 - 1.0 K/uL Final  . Eosinophils Relative 11/13/2013 2  0 - 5 % Final  . Eosinophils Absolute 11/13/2013 0.2  0.0 - 0.7 K/uL Final  . Basophils Relative 11/13/2013 0  0 - 1 % Final  . Basophils Absolute 11/13/2013 0.0  0.0 - 0.1 K/uL Final  . Smear Review 11/13/2013 Criteria for review not met   Final  . PSA 11/13/2013 1.15  <=4.00 ng/mL Final   Comment: Test Methodology: ECLIA PSA (Electrochemiluminescence Immunoassay)                                                     For PSA values from 2.5-4.0, particularly in younger  men <60 years                          old, the AUA and NCCN suggest testing for % Free PSA (3515) and                          evaluation of the rate of increase in PSA (PSA velocity).  Lab on 11/10/2013  Component Date Value Ref Range Status  . Cholesterol 11/10/2013 153  0 - 200 mg/dL Final   Comment: ATP III Classification:                                < 200        mg/dL        Desirable                               200 - 239     mg/dL        Borderline High                               >= 240        mg/dL        High                             . Triglycerides 11/10/2013 428* <150 mg/dL Final  . HDL 11/10/2013 35* >39 mg/dL Final  . Total CHOL/HDL Ratio 11/10/2013 4.4   Final  . VLDL 11/10/2013 NOT CALC  0 - 40 mg/dL Final   Comment:  Not calculated due to Triglyceride >400.                          Suggest ordering Direct LDL (Unit Code: [id number]).  . LDL Cholesterol 11/10/2013 NOT CALC  0 - 99 mg/dL Final   Comment:                            Not calculated due to Triglyceride >400.                          Suggest ordering Direct LDL (Unit Code: [id number]).                                                     Total Cholesterol/HDL Ratio:CHD Risk                                                 Coronary Heart Disease Risk Table                                                                 Men       Women                                   1/2 Average Risk              3.4        3.3                                       Average Risk              5.0        4.4                                    2X Average Risk              9.6        7.1                                    3X Average Risk             23.4       11.0                          Use the calculated Patient Ratio above and the CHD Risk table  to determine the patient's CHD Risk.                          ATP III Classification (LDL):                                < 100         mg/dL         Optimal                               100 - 129     mg/dL         Near or Above Optimal                               130 - 159     mg/dL         Borderline High                               160 - 189     mg/dL         High                                > 190        mg/dL         Very High                             . WBC 11/10/2013 9.5  4.0 - 10.5 K/uL Final  . RBC 11/10/2013 6.72* 4.22 - 5.81 MIL/uL Final  . Hemoglobin 11/10/2013 19.6* 13.0 - 17.0 g/dL Final   Result repeated and verified.  Marland Kitchen HCT 11/10/2013 55.9* 39.0 - 52.0 % Final  . MCV 11/10/2013 83.2  78.0 - 100.0 fL Final  . MCH 11/10/2013 29.2  26.0 - 34.0 pg Final  . MCHC 11/10/2013 35.6  30.0 - 36.0 g/dL Final  . RDW 11/10/2013 14.2  11.5 - 15.5 % Final  . Platelets 11/10/2013 197  150 - 400 K/uL Final  . Neutrophils Relative % 11/10/2013 75  43 - 77 % Final  . Neutro Abs 11/10/2013 7.1  1.7 - 7.7 K/uL Final  . Lymphocytes Relative 11/10/2013 18  12 - 46 % Final  . Lymphs Abs 11/10/2013 1.7  0.7 - 4.0 K/uL Final  . Monocytes Relative 11/10/2013 5  3 - 12 % Final  . Monocytes Absolute 11/10/2013 0.5  0.1 - 1.0 K/uL Final  . Eosinophils Relative 11/10/2013 2  0 - 5 % Final  . Eosinophils Absolute 11/10/2013 0.2  0.0 - 0.7 K/uL Final  . Basophils Relative 11/10/2013 0  0 - 1 % Final  . Basophils Absolute 11/10/2013 0.0  0.0 - 0.1 K/uL Final  . Smear Review 11/10/2013 Criteria for review not met   Final  . Hgb A1c MFr Bld 11/10/2013 7.9* <5.7 % Final   Comment:  According to the ADA Clinical Practice Recommendations for 2011, when                          HbA1c is used as a screening test:                                                       >=6.5%   Diagnostic of Diabetes Mellitus                                     (if abnormal result is confirmed)                                                     5.7-6.4%    Increased risk of developing Diabetes Mellitus                                                     References:Diagnosis and Classification of Diabetes Mellitus,Diabetes                          QTMA,2633,35(KTGYB 1):S62-S69 and Standards of Medical Care in                                  Diabetes - 2011,Diabetes WLSL,3734,28 (Suppl 1):S11-S61.                             . Mean Plasma Glucose 11/10/2013 180* <117 mg/dL Final  . TSH 11/10/2013 1.599  0.350 - 4.500 uIU/mL Final  . Sodium 11/10/2013 136  135 - 145 mEq/L Final  . Potassium 11/10/2013 3.9  3.5 - 5.3 mEq/L Final  . Chloride 11/10/2013 97  96 - 112 mEq/L Final  . CO2 11/10/2013 26  19 - 32 mEq/L Final  . Glucose, Bld 11/10/2013 153* 70 - 99 mg/dL Final  . BUN 11/10/2013 25* 6 - 23 mg/dL Final  . Creat 11/10/2013 1.27  0.50 - 1.35 mg/dL Final  . Total Bilirubin 11/10/2013 0.7  0.2 - 1.2 mg/dL Final  . Alkaline Phosphatase 11/10/2013 128* 39 - 117 U/L Final  . AST 11/10/2013 31  0 - 37 U/L Final  . ALT 11/10/2013 52  0 - 53 U/L Final  . Total Protein 11/10/2013 7.3  6.0 - 8.3 g/dL Final  . Albumin 11/10/2013 4.6  3.5 - 5.2 g/dL Final  . Calcium 11/10/2013 9.6  8.4 - 10.5 mg/dL Final  . GFR, Est African American 11/10/2013 69   Final  . GFR, Est Non African American 11/10/2013 59*  Final   Comment:                            The estimated GFR is a calculation valid for adults (>=  70 years old)                          that uses the CKD-EPI algorithm to adjust for age and sex. It is                            not to be used for children, pregnant women, hospitalized patients,                             patients on dialysis, or with rapidly changing kidney function.                          According to the NKDEP, eGFR >89 is normal, 60-89 shows mild                          impairment, 30-59 shows moderate impairment, 15-29 shows severe                          impairment and <15 is ESRD.                                Urinalysis No results found for: COLORURINE, APPEARANCEUR, LABSPEC, PHURINE, GLUCOSEU, HGBUR, BILIRUBINUR, KETONESUR, PROTEINUR, UROBILINOGEN, NITRITE, LEUKOCYTESUR    '@RADIOGRAPHY'$ : No results found.  PATHOLOGY:peripheral smear failed to reveal evidence of premature forms.   IMPRESSION:  #1. Probable primary polycythemia vera. #2. Chronic obstructive pulmonary disease. #3. History of duodenal ulcer, hemorrhagic, status post surgery in 2012. #4. Morbid obesity. #5. Hyperlipidemia, on treatment. #6. Diabetes mellitus, type II, non-insulin requiring. #7. Hypertension, essential.    PLAN:  #1. Additional laboratory tests were done today including JAK-2 and BCR-ABL transcripts. #2. Arrangements were made for weekly phlebotomy 6 to attain a hemoglobin of around 16 at which time treatment will be begun with Hydrea to maintain hemoglobin in that range. #3. Follow-up in 2 months with CBC.   Doroteo Bradford, MD 11/28/2013 11:04 AM   DISCLAIMER:  This note was dictated with voice recognition softwre.  Similar sounding words can inadvertently be transcribed inaccurately and may not be corrected upon review.               l

## 2013-11-27 NOTE — Progress Notes (Signed)
Harry James presented for [employer]. Labs per MD order drawn via Peripheral Line 21 gauge needle inserted in left AC  Good blood return present. Procedure without incident.  Needle removed intact. Patient tolerated procedure well.

## 2013-11-30 LAB — BCR/ABL GENE REARRANGEMENT QNT, PCR
BCR ABL1 / ABL1 IS: 0 %
BCR ABL1/ABL1: 0 %

## 2013-11-30 LAB — ERYTHROPOIETIN: Erythropoietin: 15.6 m[IU]/mL (ref 2.6–18.5)

## 2013-12-02 LAB — P210 BCR-ABL 1: P210 BCR ABL1: NOT DETECTED

## 2013-12-02 LAB — P190 BCR-ABL 1: P190 BCR ABL1: NOT DETECTED

## 2013-12-14 LAB — JAK2 GENOTYPR

## 2013-12-26 ENCOUNTER — Other Ambulatory Visit: Payer: Self-pay | Admitting: Family Medicine

## 2013-12-28 NOTE — Telephone Encounter (Signed)
Medication refilled per protocol. 

## 2013-12-30 ENCOUNTER — Other Ambulatory Visit: Payer: Self-pay | Admitting: Family Medicine

## 2013-12-30 MED ORDER — CANAGLIFLOZIN 300 MG PO TABS: 300.0000 mg | ORAL_TABLET | Freq: Every day | ORAL | Status: DC

## 2013-12-30 NOTE — Telephone Encounter (Signed)
Refill requesting Invokana 100mg . Patient increased to Invokana 300mg .

## 2014-01-01 ENCOUNTER — Other Ambulatory Visit: Payer: Self-pay | Admitting: Family Medicine

## 2014-01-25 ENCOUNTER — Encounter (HOSPITAL_COMMUNITY): Payer: Self-pay | Admitting: Oncology

## 2014-01-25 DIAGNOSIS — D751 Secondary polycythemia: Secondary | ICD-10-CM

## 2014-01-25 HISTORY — DX: Secondary polycythemia: D75.1

## 2014-01-25 NOTE — Assessment & Plan Note (Signed)
Smoking cessation education provided. 

## 2014-01-25 NOTE — Assessment & Plan Note (Addendum)
Secondary polycythemia in the setting of elevated carboxyhemoglobin at 4.9% (0- 1.5% is normal range) with negative JAK2 testing, BCR/ABL, and EPO level.  Labs today: CBC diff and ferritin.  To complete the work-up a sleep study should be performed to evaluate for sleep apnea, BUT the patient declines this study.  We will perform therapeutic phlebotomy weekly x 3 and then repeat labs in 6 weeks: CBC diff, and ferritin.

## 2014-01-25 NOTE — Progress Notes (Signed)
Harry Fraction, MD [address] [address] [place] [address]  Polycythemia, secondary - Plan: CBC with Differential, Ferritin  Tobacco abuse  CURRENT THERAPY:Therapeutic phlebotomy x 3 every week to start with.  INTERVAL HISTORY: Harry James 64 y.o. male returns for followup of secondary polycythemia in the setting of elevated carboxyhemoglobin at 4.9% (0- 1.5% is normal range) with negative JAK2 testing, BCR/ABL, and EPO level.    I personally reviewed and went over laboratory results with the patient.  The results are noted within this dictation.  He denies any signs or symptoms of sleep apnea, but he is obese with James thick neck.  He denies any one telling him he snores.  He is single.  He reports that he feels rested upon awkening in AM. I've recommended that he undergo James sleep study to evaluate for sleep apnea as this can be James cause of polycythemia.  He declines this test at this time.    "When am I going to have gallons of blood taken from me?"  I have reviewed the chart and Dr. Barnet Glasgow ordered 6 therapeutic phlebotomies according to his dictation, but there is no order or communication for this procedure.  Therefore, we will get him set up for 3 weekly phlebotomies and follow-up labs in 6 weeks.   Hematologically, he denies any complaints and ROS questioning is negative.  Past Medical History  Diagnosis Date  . Hypertension   . Hyperlipidemia   . Diabetes mellitus     prediabetes  . Ulcer     perforated duodenal ulcer 12/12  . Tobacco abuse   . Chronic kidney disease     has one functional kidney  . Polycythemia, secondary 01/25/2014    has Perforated duodenal ulcer; Fx humeral neck; Obesity (BMI 30-39.9); Renal insufficiency; Hypertension; History of ETOH abuse; Respiratory failure following trauma and surgery; COPD, severity to be determined; Dissecting aneurysm of thoracic aorta, Stanford type B; Tobacco dependence; Hyperlipidemia; Diabetes mellitus;  Ulcer; Tobacco abuse; Chronic kidney disease; and Polycythemia, secondary on his problem list.     has No Known Allergies.  Mr. [name] does not currently have medications on file.  Past Surgical History  Procedure Laterality Date  . Cystectomy  under tongue  . Laparotomy  12/16/2010    [name] patch perf du Procedure: EXPLORATORY LAPAROTOMY;  Surgeon: [name], MD;  Location: [facility];  Service: General;  Laterality: N/James;    Denies any headaches, dizziness, double vision, fevers, chills, night sweats, nausea, vomiting, diarrhea, constipation, chest pain, heart palpitations, shortness of breath, blood in stool, black tarry stool, urinary pain, urinary burning, urinary frequency, hematuria.   PHYSICAL EXAMINATION  ECOG PERFORMANCE STATUS: 1 - Symptomatic but completely ambulatory  Filed Vitals:   01/28/14 1000  BP: 160/83  Pulse: 102  Temp: 97.6 F (36.4 C)  Resp: 18    GENERAL:alert, no distress, well nourished, well developed, comfortable, cooperative, obese and smiling, chronically ill appearing SKIN: skin color, texture, turgor are normal, no rashes or significant lesions HEAD: Normocephalic, No masses, lesions, tenderness, erythematous face EYES: normal, PERRLA, EOMI, Conjunctiva are pink and non-injected EARS: External ears normal OROPHARYNX:mucous membranes are moist  NECK: supple, thyroid normal size, non-tender, without nodularity, no stridor, trachea midline LYMPH:  not examined BREAST:not examined LUNGS: decreased breath sounds HEART: regular rate & rhythm ABDOMEN:abdomen soft and normal bowel sounds BACK: Back symmetric, no curvature. EXTREMITIES:less then 2 second capillary refill, no skin discoloration  NEURO: alert &  oriented x 3 with fluent speech, no focal motor/sensory deficits, gait normal   LABORATORY DATA: CBC    Component Value Date/Time   WBC 10.1 11/27/2013 1545   RBC 6.39* 11/27/2013 1545   RBC 6.39* 11/27/2013 1545   HGB 19.3* 11/27/2013  1545   HCT 56.3* 11/27/2013 1545   PLT 196 11/27/2013 1545   MCV 88.1 11/27/2013 1545   MCH 30.2 11/27/2013 1545   MCHC 34.3 11/27/2013 1545   RDW 13.6 11/27/2013 1545   LYMPHSABS 1.9 11/27/2013 1545   MONOABS 0.4 11/27/2013 1545   EOSABS 0.2 11/27/2013 1545   BASOSABS 0.1 11/27/2013 1545      Chemistry      Component Value Date/Time   NA 140 11/27/2013 1545   K 4.1 11/27/2013 1545   CL 98 11/27/2013 1545   CO2 29 11/27/2013 1545   BUN 33* 11/27/2013 1545   CREATININE 1.22 11/27/2013 1545   CREATININE 1.27 11/10/2013 1109      Component Value Date/Time   CALCIUM 9.5 11/27/2013 1545   ALKPHOS 130* 11/27/2013 1545   AST 26 11/27/2013 1545   ALT 42 11/27/2013 1545   BILITOT 0.5 11/27/2013 1545     Results for Harry James (MRN [id number]) as of 01/25/2014 19:28  Ref. Range 11/27/2013 15:45  BCR ABL1 / ABL1 No range found 0.000  BCR ABL1 / ABL1 IS No range found 0.000  P190 BCR ABL1 No range found Not Detected  P210 BCR ABL1 No range found Not Detected   Results for Harry James (MRN [id number]) as of 01/25/2014 19:28  Ref. Range 11/27/2013 15:45  Erythropoietin Latest Range: 2.6-18.5 mIU/mL 15.6      ASSESSMENT AND PLAN:  Polycythemia, secondary Secondary polycythemia in the setting of elevated carboxyhemoglobin at 4.9% (0- 1.5% is normal range) with negative JAK2 testing, BCR/ABL, and EPO level.  Labs today: CBC diff and ferritin.  To complete the work-up James sleep study should be performed to evaluate for sleep apnea, BUT the patient declines this study.  We will perform therapeutic phlebotomy weekly x 3 and then repeat labs in 6 weeks: CBC diff, and ferritin.    Tobacco abuse Smoking cessation education provided.    THERAPY PLAN:  We will phlebotomize weekly x 3 and then recheck labs 3 weeks later.    All questions were answered. The patient knows to call the clinic with any problems, questions or concerns. We can certainly see the patient much sooner if  necessary.  Patient and plan discussed with Dr. Ancil Linsey and she is in agreement with the aforementioned.   [name] 01/28/2014

## 2014-01-27 ENCOUNTER — Other Ambulatory Visit (HOSPITAL_COMMUNITY): Payer: Self-pay

## 2014-01-27 ENCOUNTER — Ambulatory Visit (HOSPITAL_COMMUNITY): Payer: Self-pay | Admitting: Hematology & Oncology

## 2014-01-28 ENCOUNTER — Encounter (HOSPITAL_BASED_OUTPATIENT_CLINIC_OR_DEPARTMENT_OTHER): Payer: 59

## 2014-01-28 ENCOUNTER — Encounter (HOSPITAL_COMMUNITY): Payer: Self-pay | Admitting: General Surgery

## 2014-01-28 ENCOUNTER — Encounter (HOSPITAL_COMMUNITY): Payer: Self-pay

## 2014-01-28 ENCOUNTER — Encounter (HOSPITAL_COMMUNITY): Payer: 59 | Attending: Oncology | Admitting: Oncology

## 2014-01-28 ENCOUNTER — Encounter (HOSPITAL_COMMUNITY): Payer: 59 | Attending: Hematology and Oncology

## 2014-01-28 VITALS — BP 160/83 | HR 102 | Temp 97.6°F | Resp 18 | Wt 226.6 lb

## 2014-01-28 DIAGNOSIS — Z8589 Personal history of malignant neoplasm of other organs and systems: Secondary | ICD-10-CM | POA: Insufficient documentation

## 2014-01-28 DIAGNOSIS — Z720 Tobacco use: Secondary | ICD-10-CM | POA: Insufficient documentation

## 2014-01-28 DIAGNOSIS — E785 Hyperlipidemia, unspecified: Secondary | ICD-10-CM | POA: Diagnosis not present

## 2014-01-28 DIAGNOSIS — I10 Essential (primary) hypertension: Secondary | ICD-10-CM | POA: Insufficient documentation

## 2014-01-28 DIAGNOSIS — D751 Secondary polycythemia: Secondary | ICD-10-CM | POA: Insufficient documentation

## 2014-01-28 DIAGNOSIS — J449 Chronic obstructive pulmonary disease, unspecified: Secondary | ICD-10-CM | POA: Insufficient documentation

## 2014-01-28 DIAGNOSIS — E119 Type 2 diabetes mellitus without complications: Secondary | ICD-10-CM | POA: Diagnosis not present

## 2014-01-28 LAB — CBC WITH DIFFERENTIAL/PLATELET
Basophils Absolute: 0.1 10*3/uL (ref 0.0–0.1)
Basophils Relative: 1 % (ref 0–1)
EOS PCT: 2 % (ref 0–5)
Eosinophils Absolute: 0.2 10*3/uL (ref 0.0–0.7)
HEMATOCRIT: 60.3 % — AB (ref 39.0–52.0)
Hemoglobin: 20.5 g/dL — ABNORMAL HIGH (ref 13.0–17.0)
Lymphocytes Relative: 19 % (ref 12–46)
Lymphs Abs: 2.2 10*3/uL (ref 0.7–4.0)
MCH: 30.6 pg (ref 26.0–34.0)
MCHC: 34 g/dL (ref 30.0–36.0)
MCV: 90.1 fL (ref 78.0–100.0)
MONOS PCT: 4 % (ref 3–12)
Monocytes Absolute: 0.5 10*3/uL (ref 0.1–1.0)
NEUTROS ABS: 8.5 10*3/uL — AB (ref 1.7–7.7)
Neutrophils Relative %: 74 % (ref 43–77)
PLATELETS: 185 10*3/uL (ref 150–400)
RBC: 6.69 MIL/uL — ABNORMAL HIGH (ref 4.22–5.81)
RDW: 14 % (ref 11.5–15.5)
WBC: 11.4 10*3/uL — ABNORMAL HIGH (ref 4.0–10.5)

## 2014-01-28 NOTE — Progress Notes (Signed)
Labs for cbcd 

## 2014-01-28 NOTE — Addendum Note (Signed)
Addended by: Jeannette How D on: 01/28/2014 01:08 PM   Modules accepted: Orders

## 2014-01-28 NOTE — Patient Instructions (Addendum)
[place] at [facility]  Discharge Instructions:  Labs are reviewed today.  Your elevated hemoglobin is secondary to lung disease from tobacco abuse. Labs will be repeated in 6 weeks.  We will provide 3 phlebotomies (one every week) to start. We recommend a sleep study to rule out sleep apnea.  Below is some information regarding sleep apnea. Return in 6 weeks for follow-up. _______________________________________________________________  Thank you for choosing [place] at [facility] to provide your oncology and hematology care.  To afford each patient quality time with our providers, please arrive at least 15 minutes before your scheduled appointment.  You need to re-schedule your appointment if you arrive 10 or more minutes late.  We strive to give you quality time with our providers, and arriving late affects you and other patients whose appointments are after yours.  Also, if you no show three or more times for appointments you may be dismissed from the clinic.  Again, thank you for choosing [place] at [place] hope is that these requests will allow you access to exceptional care and in a timely manner. _______________________________________________________________  If you have questions after your visit, please contact our office at [phone] between the hours of 8:30 a.m. and 5:00 p.m. Voicemails left after 4:30 p.m. will not be returned until the following business day. _______________________________________________________________  For prescription refill requests, have your pharmacy contact our office. _______________________________________________________________  Recommendations made by the consultant and any test results will be sent to your referring physician. _______________________________________________________________  Sleep Apnea  Sleep apnea is a sleep disorder characterized by  abnormal pauses in breathing while you sleep. When your breathing pauses, the level of oxygen in your blood decreases. This causes you to move out of deep sleep and into light sleep. As a result, your quality of sleep is poor, and the system that carries your blood throughout your body (cardiovascular system) experiences stress. If sleep apnea remains untreated, the following conditions can develop:  High blood pressure (hypertension).  Coronary artery disease.  Inability to achieve or maintain an erection (impotence).  Impairment of your thought process (cognitive dysfunction). There are three types of sleep apnea: 1. Obstructive sleep apnea--Pauses in breathing during sleep because of a blocked airway. 2. Central sleep apnea--Pauses in breathing during sleep because the area of the brain that controls your breathing does not send the correct signals to the muscles that control breathing. 3. Mixed sleep apnea--A combination of both obstructive and central sleep apnea. RISK FACTORS The following risk factors can increase your risk of developing sleep apnea:  Being overweight.  Smoking.  Having narrow passages in your nose and throat.  Being of older age.  Being male.  Alcohol use.  Sedative and tranquilizer use.  Ethnicity. Among individuals younger than 35 years, African Americans are at increased risk of sleep apnea. SYMPTOMS   Difficulty staying asleep.  Daytime sleepiness and fatigue.  Loss of energy.  Irritability.  Loud, heavy snoring.  Morning headaches.  Trouble concentrating.  Forgetfulness.  Decreased interest in sex. DIAGNOSIS  In order to diagnose sleep apnea, your caregiver will perform a physical examination. Your caregiver may suggest that you take a home sleep test. Your caregiver may also recommend that you spend the night in a sleep lab. In the sleep lab, several monitors record information about your heart, lungs, and brain while you sleep. Your  leg and arm movements and blood oxygen level are  also recorded. TREATMENT The following actions may help to resolve mild sleep apnea:  Sleeping on your side.   Using a decongestant if you have nasal congestion.   Avoiding the use of depressants, including alcohol, sedatives, and narcotics.   Losing weight and modifying your diet if you are overweight. There also are devices and treatments to help open your airway:  Oral appliances. These are custom-made mouthpieces that shift your lower jaw forward and slightly open your bite. This opens your airway.  Devices that create positive airway pressure. This positive pressure "splints" your airway open to help you breathe better during sleep. The following devices create positive airway pressure:  Continuous positive airway pressure (CPAP) device. The CPAP device creates a continuous level of air pressure with an air pump. The air is delivered to your airway through a mask while you sleep. This continuous pressure keeps your airway open.  Nasal expiratory positive airway pressure (EPAP) device. The EPAP device creates positive air pressure as you exhale. The device consists of single-use valves, which are inserted into each nostril and held in place by adhesive. The valves create very little resistance when you inhale but create much more resistance when you exhale. That increased resistance creates the positive airway pressure. This positive pressure while you exhale keeps your airway open, making it easier to breath when you inhale again.  Bilevel positive airway pressure (BPAP) device. The BPAP device is used mainly in patients with central sleep apnea. This device is similar to the CPAP device because it also uses an air pump to deliver continuous air pressure through a mask. However, with the BPAP machine, the pressure is set at two different levels. The pressure when you exhale is lower than the pressure when you inhale.  Surgery. Typically,  surgery is only done if you cannot comply with less invasive treatments or if the less invasive treatments do not improve your condition. Surgery involves removing excess tissue in your airway to create a wider passage way. Document Released: 12/22/2001 Document Revised: 04/28/2012 Document Reviewed: 05/10/2011 [facility] Patient Information 2015 [place], [place]. This information is not intended to replace advice given to you by your health care provider. Make sure you discuss any questions you have with your health care provider.

## 2014-01-28 NOTE — Progress Notes (Signed)
TPR performed via 21 gauge butterfly needles. Approximately 8 needle sticks performed to obtain blood. Volume removed = 347ml. Pt stable and did fine with procedure. Ulice Dash phlebotomist assisted with the butterfly needle insertions. Patient drank 8 oz of water while here. Entire procedure took 1 hour to perform due to multiple venipunctures. Pt currently is nto a candidate for a 14 gauge needle stick. His veins are small. Pt encouraged to drink plenty of fluids starting the day before his next TPR.

## 2014-01-29 LAB — FERRITIN: FERRITIN: 170 ng/mL (ref 22–322)

## 2014-02-04 ENCOUNTER — Encounter (HOSPITAL_BASED_OUTPATIENT_CLINIC_OR_DEPARTMENT_OTHER): Payer: 59

## 2014-02-04 ENCOUNTER — Encounter (HOSPITAL_COMMUNITY): Payer: Self-pay

## 2014-02-04 DIAGNOSIS — D751 Secondary polycythemia: Secondary | ICD-10-CM

## 2014-02-04 NOTE — Progress Notes (Signed)
Harry James presents today for phlebotomy per MD orders. Phlebotomy procedure started at 1203 and ended at 1240. 500 cc removed. Patient tolerated procedure well. IV needle removed intact.  Patient was stuck with 21 gauge butterfly needle in LT AC x 2, RT AC x 1, and RT wrist area x 1 to obtain 500cc of blood. Procedure completed. Pt fine. Venipuncture sites WNL. Veins were much better with this TPR vs the first TPR. Pt instructed that he may feel more tired than usual due to the blood volume loss. Patient didn't really feel different after the 1st TPR. I also instructed patient to push fluids today and tomorrow due to the blood volume loss from today's TPR. He said ok. I will call pt Sunday pm to schedule him for TPR this upcoming week.

## 2014-02-12 ENCOUNTER — Encounter (HOSPITAL_BASED_OUTPATIENT_CLINIC_OR_DEPARTMENT_OTHER): Payer: 59

## 2014-02-12 DIAGNOSIS — D751 Secondary polycythemia: Secondary | ICD-10-CM

## 2014-02-12 NOTE — Progress Notes (Signed)
Harry James presents today for phlebotomy per MD orders. Phlebotomy procedure started at [place] and ended at 1005. 500 cc removed. Patient tolerated procedure well. IV needle removed intact.  Pt was stuck 4 times (2 times in the LT [facility], 1 time in the RT [facility], and 1 time in the Rt lower arm) with a 21 gauge butterfly needle to obtain the 500cc of blood. Venipuncture sites wnl after needle removal. Patient tolerated this well.  This completes the 3rd TPR ordered for patient.

## 2014-02-15 ENCOUNTER — Other Ambulatory Visit: Payer: 59

## 2014-02-15 DIAGNOSIS — IMO0002 Reserved for concepts with insufficient information to code with codable children: Secondary | ICD-10-CM

## 2014-02-15 DIAGNOSIS — E1165 Type 2 diabetes mellitus with hyperglycemia: Secondary | ICD-10-CM

## 2014-02-15 DIAGNOSIS — E785 Hyperlipidemia, unspecified: Secondary | ICD-10-CM

## 2014-02-15 LAB — LIPID PANEL
Cholesterol: 154 mg/dL (ref 0–200)
HDL: 41 mg/dL (ref >39)
LDL Cholesterol: 55 mg/dL (ref 0–99)
TRIGLYCERIDES: 292 mg/dL — AB (ref <150)
Total CHOL/HDL Ratio: 3.8 Ratio
VLDL: 58 mg/dL — ABNORMAL HIGH (ref 0–40)

## 2014-02-15 LAB — HEMOGLOBIN A1C
Hgb A1c MFr Bld: 6.8 % — ABNORMAL HIGH (ref <5.7)
MEAN PLASMA GLUCOSE: 148 mg/dL — AB (ref <117)

## 2014-02-17 ENCOUNTER — Encounter: Payer: Self-pay | Admitting: Family Medicine

## 2014-02-19 ENCOUNTER — Ambulatory Visit (INDEPENDENT_AMBULATORY_CARE_PROVIDER_SITE_OTHER): Payer: 59 | Admitting: Family Medicine

## 2014-02-19 ENCOUNTER — Encounter: Payer: Self-pay | Admitting: Family Medicine

## 2014-02-19 VITALS — BP 140/74 | HR 86 | Temp 97.6°F | Resp 18 | Ht 66.0 in | Wt 227.0 lb

## 2014-02-19 DIAGNOSIS — D751 Secondary polycythemia: Secondary | ICD-10-CM

## 2014-02-19 DIAGNOSIS — I10 Essential (primary) hypertension: Secondary | ICD-10-CM

## 2014-02-19 DIAGNOSIS — E785 Hyperlipidemia, unspecified: Secondary | ICD-10-CM

## 2014-02-19 DIAGNOSIS — E119 Type 2 diabetes mellitus without complications: Secondary | ICD-10-CM

## 2014-02-19 MED ORDER — TRAMADOL HCL (ER BIPHASIC) 100 MG PO CP24
100.0000 mg | ORAL_CAPSULE | Freq: Four times a day (QID) | ORAL | Status: DC | PRN
Start: 2014-02-19 — End: 2014-03-02

## 2014-02-19 NOTE — Progress Notes (Signed)
Subjective:    Patient ID: Harry James, male    DOB: 29-Jan-1950, 64 y.o.   MRN: [id number]  HPI Patient is here today for a follow-up. At his last office visit he was found to have a hemoglobin A1c close to 8. His triglycerides were greater than 400 and his LDL was not calculable.  Patient is here today for recheck. He was also found to have polycythemia with a hemoglobin approaching 20. 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 Patient is compliant with his Invokana, and Actos. His hemoglobin A1c has improved to 6.8. His triglycerides have fallen into the 200s. His LDL cholesterol is at goal less than 100. His most recent lab work as listed below. Unfortunately the patient continues to smoke. He is not exercising. His blood pressure is borderline well controlled. He denies any chest pain shortness of breath or dyspnea on exertion.  Patient's Pneumovax is up-to-date. He is due for diabetic foot exam today as well as a diabetic eye exam. Past Medical History  Diagnosis Date  . Hypertension   . Hyperlipidemia   . Diabetes mellitus     prediabetes  . Ulcer     perforated duodenal ulcer 12/12  . Tobacco abuse   . Chronic kidney disease     has one functional kidney  . Polycythemia, secondary 01/25/2014   Past Surgical History  Procedure Laterality Date  . Cystectomy  under tongue  . Laparotomy  12/16/2010    [name] patch perf du Procedure: EXPLORATORY LAPAROTOMY;  Surgeon: [name], MD;  Location: [facility];  Service: General;  Laterality: N/A;   Current Outpatient Prescriptions on File Prior to Visit  Medication Sig Dispense Refill  . aspirin 325 MG EC tablet Take 325 mg by mouth 1 day or 1 dose. Stated he has been taking twice a day    . canagliflozin (INVOKANA) 300 MG TABS tablet Take 300 mg by mouth daily before breakfast. 30 tablet 3  . cloNIDine (CATAPRES) 0.3 MG tablet TAKE 1 TABLET BY MOUTH TWICE DAILY 180 tablet 3    . Fish Oil OIL by Does not apply route. 2 po qhs    . furosemide (LASIX) 80 MG tablet TAKE 1 TABLET BY MOUTH EVERY DAY 30 tablet 5  . metoprolol (LOPRESSOR) 100 MG tablet TAKE 1 TABLET BY MOUTH TWICE DAILY 60 tablet 11  . Multiple Vitamins-Minerals (MULTIVITAMINS THER. W/MINERALS) TABS Take 1 tablet by mouth daily. 30 each   . pioglitazone (ACTOS) 15 MG tablet TAKE 1 TABLET BY MOUTH EVERY DAY 30 tablet 5  . simvastatin (ZOCOR) 40 MG tablet TAKE 1 TABLET BY MOUTH EVERY EVENING 30 tablet 3   No current facility-administered medications on file prior to visit.   No Known Allergies History   Social History  . Marital Status: Divorced    Spouse Name: N/A    Number of Children: N/A  . Years of Education: N/A   Occupational History  . Not on file.   Social History Main Topics  . Smoking status: Current Every Day Smoker -- 1.00 packs/day    Types: Cigarettes  . Smokeless tobacco: Never Used  . Alcohol Use: Yes     Comment: daily  . Drug Use: No  . Sexual Activity: Not on file   Other Topics Concern  . Not on file   Social History Narrative     Review of Systems  All other systems reviewed and are negative.  Objective:   Physical Exam  Constitutional: He is oriented to person, place, and time. He appears well-developed and well-nourished.  HENT:  Right Ear: External ear normal.  Left Ear: External ear normal.  Nose: Nose normal.  Mouth/Throat: Oropharynx is clear and moist.  Eyes: Conjunctivae are normal.  Neck: Neck supple. No JVD present. No thyromegaly present.  Cardiovascular: Normal rate, regular rhythm, normal heart sounds and intact distal pulses.  Exam reveals no gallop and no friction rub.   No murmur heard. Pulmonary/Chest: Effort normal and breath sounds normal. No respiratory distress. He has no wheezes. He has no rales. He exhibits no tenderness.  Abdominal: Soft. Bowel sounds are normal. He exhibits no distension and no mass. There is no tenderness.  There is no rebound and no guarding.  Musculoskeletal: He exhibits no edema.  Lymphadenopathy:    He has no cervical adenopathy.  Neurological: He is alert and oriented to person, place, and time. He has normal reflexes. He displays normal reflexes. No cranial nerve deficit. He exhibits normal muscle tone. Coordination normal.  Vitals reviewed.         Assessment & Plan:  Diabetes mellitus type II, controlled - Plan: Microalbumin, urine  Polycythemia  Essential hypertension  HLD (hyperlipidemia)  Blood pressures well controlled. Diabetes control is acceptable. I did strongly recommend smoking cessation. Also recommended he follow up as planned with oncology. I recommended an annual diabetic eye exam but the patient would like to defer this until after he goes on Medicare this summer. Diabetic foot exam is performed and is normal. Cholesterol LDL is acceptable. Triglycerides are elevated and we discussed a low carb low saturated fat diet to address this. Also recommended gradually increasing aerobic exercise to 30 minutes a day 5 days a week to help address his weight as well as his high triglycerides.

## 2014-03-02 ENCOUNTER — Telehealth: Payer: Self-pay | Admitting: Family Medicine

## 2014-03-02 MED ORDER — TRAMADOL HCL 50 MG PO TABS: 100.0000 mg | ORAL_TABLET | Freq: Four times a day (QID) | ORAL | Status: DC

## 2014-03-02 NOTE — Telephone Encounter (Signed)
Tramadol ER needs PA - per Dr. Dennard Schaumann ok to change to non er Tramadol - Med called to pharm - Tramadol 50mg  2 tab po qid

## 2014-03-11 ENCOUNTER — Encounter (HOSPITAL_COMMUNITY): Payer: 59 | Attending: Hematology and Oncology | Admitting: Hematology & Oncology

## 2014-03-11 ENCOUNTER — Encounter (HOSPITAL_BASED_OUTPATIENT_CLINIC_OR_DEPARTMENT_OTHER): Payer: 59

## 2014-03-11 ENCOUNTER — Encounter (HOSPITAL_COMMUNITY): Payer: Self-pay | Admitting: Hematology & Oncology

## 2014-03-11 VITALS — BP 141/77 | HR 83 | Temp 99.0°F | Resp 20 | Wt 228.2 lb

## 2014-03-11 DIAGNOSIS — Z8589 Personal history of malignant neoplasm of other organs and systems: Secondary | ICD-10-CM | POA: Diagnosis not present

## 2014-03-11 DIAGNOSIS — I10 Essential (primary) hypertension: Secondary | ICD-10-CM | POA: Diagnosis not present

## 2014-03-11 DIAGNOSIS — E785 Hyperlipidemia, unspecified: Secondary | ICD-10-CM | POA: Diagnosis not present

## 2014-03-11 DIAGNOSIS — D751 Secondary polycythemia: Secondary | ICD-10-CM

## 2014-03-11 DIAGNOSIS — J449 Chronic obstructive pulmonary disease, unspecified: Secondary | ICD-10-CM | POA: Diagnosis not present

## 2014-03-11 DIAGNOSIS — E119 Type 2 diabetes mellitus without complications: Secondary | ICD-10-CM | POA: Insufficient documentation

## 2014-03-11 LAB — CBC WITH DIFFERENTIAL/PLATELET
BASOS ABS: 0.1 10*3/uL (ref 0.0–0.1)
Basophils Relative: 1 % (ref 0–1)
EOS PCT: 2 % (ref 0–5)
Eosinophils Absolute: 0.2 10*3/uL (ref 0.0–0.7)
HCT: 53.5 % — ABNORMAL HIGH (ref 39.0–52.0)
HEMOGLOBIN: 17.8 g/dL — AB (ref 13.0–17.0)
Lymphocytes Relative: 19 % (ref 12–46)
Lymphs Abs: 1.9 10*3/uL (ref 0.7–4.0)
MCH: 29.9 pg (ref 26.0–34.0)
MCHC: 33.3 g/dL (ref 30.0–36.0)
MCV: 89.8 fL (ref 78.0–100.0)
Monocytes Absolute: 0.4 10*3/uL (ref 0.1–1.0)
Monocytes Relative: 4 % (ref 3–12)
NEUTROS ABS: 7.7 10*3/uL (ref 1.7–7.7)
NEUTROS PCT: 74 % (ref 43–77)
Platelets: 198 10*3/uL (ref 150–400)
RBC: 5.96 MIL/uL — AB (ref 4.22–5.81)
RDW: 13.7 % (ref 11.5–15.5)
WBC: 10.3 10*3/uL (ref 4.0–10.5)

## 2014-03-11 LAB — FERRITIN: Ferritin: 34 ng/mL (ref 22–322)

## 2014-03-11 NOTE — Progress Notes (Signed)
Labs for cbcd,ferr 

## 2014-03-11 NOTE — Progress Notes (Signed)
Patient had to leave without being seen. Stated he could not wait any longer.

## 2014-03-12 ENCOUNTER — Encounter (HOSPITAL_BASED_OUTPATIENT_CLINIC_OR_DEPARTMENT_OTHER): Payer: 59 | Admitting: Hematology & Oncology

## 2014-03-12 ENCOUNTER — Encounter (HOSPITAL_COMMUNITY): Payer: Self-pay | Admitting: Hematology & Oncology

## 2014-03-12 VITALS — BP 125/57 | HR 66 | Temp 97.4°F | Resp 18 | Wt 229.1 lb

## 2014-03-12 DIAGNOSIS — R0602 Shortness of breath: Secondary | ICD-10-CM

## 2014-03-12 DIAGNOSIS — D751 Secondary polycythemia: Secondary | ICD-10-CM

## 2014-03-12 DIAGNOSIS — Z720 Tobacco use: Secondary | ICD-10-CM

## 2014-03-12 NOTE — Patient Instructions (Signed)
[place] at [facility] Discharge Instructions  RECOMMENDATIONS MADE BY THE CONSULTANT AND ANY TEST RESULTS WILL BE SENT TO YOUR REFERRING PHYSICIAN.  Exam and discussion by Dr. Whitney Muse  Report any concerns or issues Will get Pulmonary Function Tests performed Follow-up in 1 month with labs and office visit.    Thank you for choosing [place] at [facility] to provide your oncology and hematology care.  To afford each patient quality time with our provider, please arrive at least 15 minutes before your scheduled appointment time.    You need to re-schedule your appointment should you arrive 10 or more minutes late.  We strive to give you quality time with our providers, and arriving late affects you and other patients whose appointments are after yours.  Also, if you no show three or more times for appointments you may be dismissed from the clinic at the providers discretion.     Again, thank you for choosing [facility].  Our hope is that these requests will decrease the amount of time that you wait before being seen by our physicians.       _____________________________________________________________  Should you have questions after your visit to [facility], please contact our office at [phone] between the hours of 8:30 a.m. and 4:30 p.m.  Voicemails left after 4:30 p.m. will not be returned until the following business day.  For prescription refill requests, have your pharmacy contact our office.

## 2014-03-12 NOTE — Progress Notes (Signed)
Harry Fraction, MD [address] [place] [address]    DIAGNOSIS: Secondary Polycythemia  CURRENT THERAPY: phlebotomy  INTERVAL HISTORY: Harry James 64 y.o. male returns for polycythemia, he is JAK 2 negative. He has had a hemoglobin as high as 22. He reports he feels well. He has smoked since he was a teenager and has averaged a pack a day his entire life. He has never had pulmonary function tests. He thinks he sleeps fine. Approximately 7 hours a night and reports he only naps occasionally during the day. He wakes up and feels rested. He is overweight. He denies any headaches, blurry vision, but does complain of shortness of breath which is worsening. He says going up stairs is not something he enjoys. He has recently had 3 phlebotomies. With a hemoglobin of 17.8 yesterday. He continues to smoke and states he is willing at this point to try to quit. His mother is still living at the age of 84 and he states she quit smoking in her 53s.  MEDICAL HISTORY: Past Medical History  Diagnosis Date  . Hypertension   . Hyperlipidemia   . Diabetes mellitus     prediabetes  . Ulcer     perforated duodenal ulcer 12/12  . Tobacco abuse   . Chronic kidney disease     has one functional kidney  . Polycythemia, secondary 01/25/2014    has Perforated duodenal ulcer; Fx humeral neck; Obesity (BMI 30-39.9); Renal insufficiency; Hypertension; History of ETOH abuse; Respiratory failure following trauma and surgery; COPD, severity to be determined; Dissecting aneurysm of thoracic aorta, Stanford type B; Tobacco dependence; Hyperlipidemia; Diabetes mellitus; Ulcer; Tobacco abuse; Chronic kidney disease; and Polycythemia, secondary on his problem list.     has No Known Allergies.  Mr. Krantz had no medications administered during this visit.  SURGICAL HISTORY: Past Surgical History  Procedure Laterality Date  . Cystectomy  under tongue  . Laparotomy  12/16/2010    [name] patch  perf du Procedure: EXPLORATORY LAPAROTOMY;  Surgeon: [name], MD;  Location: [facility];  Service: General;  Laterality: N/A;    SOCIAL HISTORY: History   Social History  . Marital Status: Divorced    Spouse Name: N/A  . Number of Children: N/A  . Years of Education: N/A   Occupational History  . Not on file.   Social History Main Topics  . Smoking status: Current Every Day Smoker -- 1.00 packs/day    Types: Cigarettes  . Smokeless tobacco: Never Used  . Alcohol Use: Yes     Comment: daily  . Drug Use: No  . Sexual Activity: Not on file   Other Topics Concern  . Not on file   Social History Narrative    FAMILY HISTORY: History reviewed. No pertinent family history.  Review of Systems  Constitutional: Negative.   HENT: Negative.   Eyes: Negative.   Respiratory: Positive for sputum production and shortness of breath.   Cardiovascular: Negative.   Gastrointestinal: Negative.   Genitourinary: Negative.   Musculoskeletal: Positive for joint pain.  Skin: Negative.   Neurological: Negative.   Endo/Heme/Allergies: Negative.   Psychiatric/Behavioral: Negative.     PHYSICAL EXAMINATION  ECOG PERFORMANCE STATUS: 1 - Symptomatic but completely ambulatory  Filed Vitals:   03/12/14 0813  BP: 125/57  Pulse: 66  Temp: 97.4 F (36.3 C)  Resp: 18    Physical Exam  Constitutional: He is oriented to person, place, and time and well-developed, well-nourished, and  in no distress.  Obese, short neck, obese abdomen  HENT:  Head: Normocephalic and atraumatic.  Nose: Nose normal.  Mouth/Throat: Oropharynx is clear and moist. No oropharyngeal exudate.  Eyes: Conjunctivae and EOM are normal. Pupils are equal, round, and reactive to light. Right eye exhibits no discharge. Left eye exhibits no discharge. No scleral icterus.  Neck: Normal range of motion. Neck supple. No tracheal deviation present. No thyromegaly present.  Cardiovascular: Normal rate, regular rhythm and  normal heart sounds.  Exam reveals no gallop and no friction rub.   No murmur heard. Pulmonary/Chest: Effort normal and breath sounds normal. He has no wheezes. He has no rales.  Abdominal: Soft. Bowel sounds are normal. He exhibits no distension and no mass. There is no tenderness. There is no rebound and no guarding.  Musculoskeletal: Normal range of motion. He exhibits no edema.  Lymphadenopathy:    He has no cervical adenopathy.  Neurological: He is alert and oriented to person, place, and time. He has normal reflexes. No cranial nerve deficit. Gait normal. Coordination normal.  Skin: Skin is warm and dry. No rash noted.  Psychiatric: Mood, memory, affect and judgment normal.  Nursing note and vitals reviewed.   LABORATORY DATA:  CBC    Component Value Date/Time   WBC 10.3 03/11/2014 1208   RBC 5.96* 03/11/2014 1208   RBC 6.39* 11/27/2013 1545   HGB 17.8* 03/11/2014 1208   HCT 53.5* 03/11/2014 1208   PLT 198 03/11/2014 1208   MCV 89.8 03/11/2014 1208   MCH 29.9 03/11/2014 1208   MCHC 33.3 03/11/2014 1208   RDW 13.7 03/11/2014 1208   LYMPHSABS 1.9 03/11/2014 1208   MONOABS 0.4 03/11/2014 1208   EOSABS 0.2 03/11/2014 1208   BASOSABS 0.1 03/11/2014 1208   CMP     Component Value Date/Time   NA 140 11/27/2013 1545   K 4.1 11/27/2013 1545   CL 98 11/27/2013 1545   CO2 29 11/27/2013 1545   GLUCOSE 199* 11/27/2013 1545   BUN 33* 11/27/2013 1545   CREATININE 1.22 11/27/2013 1545   CREATININE 1.27 11/10/2013 1109   CALCIUM 9.5 11/27/2013 1545   PROT 7.5 11/27/2013 1545   ALBUMIN 3.9 11/27/2013 1545   AST 26 11/27/2013 1545   ALT 42 11/27/2013 1545   ALKPHOS 130* 11/27/2013 1545   BILITOT 0.5 11/27/2013 1545   GFRNONAA 61* 11/27/2013 1545   GFRNONAA 59* 11/10/2013 1109   GFRAA 71* 11/27/2013 1545   GFRAA 69 11/10/2013 1109      ASSESSMENT and THERAPY PLAN:   Polycythemia, JAK2 negative, secondary Tobacco Abuse  64 year old male with polycythemia, felt to be  secondary polycythemia. Hemoglobin has been as high as 22. He does not give symptoms consistent with sleep apnea. He is a heavy smoker. I have recommended pulmonary function testing given his chronic shortness of breath. A carboxyhemoglobin level was 4.9% in November 2015. JAK 2 V617F testing is negative and reflex testing in EXON 12 and 13 is also negative.  Erythropoietin level was WNL, and not supressed.   I have recommended follow-up in a month. We again addressed smoking cessation in detail today. He was given multiple suggestions to aid in quitting. We will see him back in 4 weeks to see how he is doing.  No problem-specific assessment & plan notes found for this encounter.   All questions were answered. The patient knows to call the clinic with any problems, questions or concerns. We can certainly see the patient much sooner if necessary.  This note was electronically signed. Molli Hazard 03/12/2014

## 2014-03-15 ENCOUNTER — Other Ambulatory Visit: Payer: Self-pay | Admitting: Family Medicine

## 2014-03-17 ENCOUNTER — Ambulatory Visit (HOSPITAL_COMMUNITY): Admission: RE | Admit: 2014-03-17 | Payer: 59 | Source: Ambulatory Visit

## 2014-03-17 ENCOUNTER — Ambulatory Visit (HOSPITAL_COMMUNITY): Payer: 59

## 2014-03-27 ENCOUNTER — Other Ambulatory Visit: Payer: Self-pay | Admitting: Family Medicine

## 2014-03-31 NOTE — Progress Notes (Signed)
Patient did not stay for his visit today. He will return tomorrow morning for additional evaluation. SPenland MD

## 2014-04-08 ENCOUNTER — Encounter (HOSPITAL_BASED_OUTPATIENT_CLINIC_OR_DEPARTMENT_OTHER): Payer: 59

## 2014-04-08 ENCOUNTER — Encounter (HOSPITAL_COMMUNITY): Payer: Self-pay | Admitting: Oncology

## 2014-04-08 ENCOUNTER — Encounter (HOSPITAL_COMMUNITY): Payer: 59 | Attending: Hematology and Oncology | Admitting: Oncology

## 2014-04-08 VITALS — BP 134/87 | HR 81 | Temp 98.0°F | Resp 16 | Wt 228.0 lb

## 2014-04-08 DIAGNOSIS — Z720 Tobacco use: Secondary | ICD-10-CM

## 2014-04-08 DIAGNOSIS — D751 Secondary polycythemia: Secondary | ICD-10-CM

## 2014-04-08 DIAGNOSIS — Z8589 Personal history of malignant neoplasm of other organs and systems: Secondary | ICD-10-CM | POA: Insufficient documentation

## 2014-04-08 DIAGNOSIS — E785 Hyperlipidemia, unspecified: Secondary | ICD-10-CM | POA: Insufficient documentation

## 2014-04-08 DIAGNOSIS — E119 Type 2 diabetes mellitus without complications: Secondary | ICD-10-CM | POA: Insufficient documentation

## 2014-04-08 DIAGNOSIS — I10 Essential (primary) hypertension: Secondary | ICD-10-CM | POA: Insufficient documentation

## 2014-04-08 DIAGNOSIS — J449 Chronic obstructive pulmonary disease, unspecified: Secondary | ICD-10-CM | POA: Insufficient documentation

## 2014-04-08 NOTE — Assessment & Plan Note (Addendum)
Secondary polycythemia in the setting of elevated carboxyhemoglobin at 4.9% (0- 1.5% is normal range) with negative JAK2 testing, BCR/ABL, and EPO level.  S/P 3 therapeutic phlebotomies on 1/14, 1/21, 02/12/2014.  Labs today: CBC diff and ferritin.  To complete the work-up a sleep study should be performed to evaluate for sleep apnea, BUT the patient declines this study again today.  Labs in 6 weeks and 12 weeks: CBC.  Depending on his lab results, this plan may need changed, but I will wait and see.   Return in 12 weeks for follow-up.

## 2014-04-08 NOTE — Assessment & Plan Note (Signed)
Smoking cessation education provided. 

## 2014-04-08 NOTE — Progress Notes (Signed)
Harry Fraction, MD [address] [place] [address]  Polycythemia, secondary - Plan: CBC with Differential, CBC with Differential  Tobacco abuse  CURRENT THERAPY: S/P 3 therapeutic phlebotomies: 1/14, 1/21, 02/12/2014  INTERVAL HISTORY: Harry James 64 y.o. male returns for followup of JAK2 negative polycythemia.   I personally reviewed and went over laboratory results with the patient.  The results are noted within this dictation.  Updated labs from today are not back yet.  He denies any new complaints since last seen.  He reports that he doesn't notice a difference clinically since the phlebotomies.  His BP seems to be better controlled since the phlebotomies.    Hematologically, he denies any complaints and ROS questioning is negative.   Past Medical History  Diagnosis Date  . Hypertension   . Hyperlipidemia   . Diabetes mellitus     prediabetes  . Ulcer     perforated duodenal ulcer 12/12  . Tobacco abuse   . Chronic kidney disease     has one functional kidney  . Polycythemia, secondary 01/25/2014    has Perforated duodenal ulcer; Fx humeral neck; Obesity (BMI 30-39.9); Renal insufficiency; Hypertension; History of ETOH abuse; Respiratory failure following trauma and surgery; COPD, severity to be determined; Dissecting aneurysm of thoracic aorta, Stanford type B; Tobacco dependence; Hyperlipidemia; Diabetes mellitus; Ulcer; Tobacco abuse; Chronic kidney disease; and Polycythemia, secondary on his problem list.     has No Known Allergies.  Mr. [name] had no medications administered during this visit.  Past Surgical History  Procedure Laterality Date  . Cystectomy  under tongue  . Laparotomy  12/16/2010    Harry James patch perf du Procedure: EXPLORATORY LAPAROTOMY;  Surgeon: [name], MD;  Location: [facility];  Service: General;  Laterality: N/A;    Denies any headaches, dizziness, double vision, fevers, chills, night sweats, nausea,  vomiting, diarrhea, constipation, chest pain, heart palpitations, shortness of breath, blood in stool, black tarry stool, urinary pain, urinary burning, urinary frequency, hematuria.   PHYSICAL EXAMINATION  ECOG PERFORMANCE STATUS: 0 - Asymptomatic  Filed Vitals:   04/08/14 1042  BP: 134/87  Pulse: 81  Temp: 98 F (36.7 C)  Resp: 16    GENERAL:alert, no distress, well nourished, well developed, comfortable, cooperative, obese, smiling and red face SKIN: skin color, texture, turgor are normal, no rashes or significant lesions HEAD: Normocephalic, No masses, lesions, tenderness or abnormalities EYES: normal, PERRLA, EOMI, Conjunctiva are pink and non-injected EARS: External ears normal OROPHARYNX:lips, buccal mucosa, and tongue normal and mucous membranes are moist  NECK: supple, no adenopathy, trachea midline LYMPH:  no palpable lymphadenopathy BREAST:not examined LUNGS: clear to auscultation , decreased breath sounds HEART: regular rate & rhythm, no murmurs and no gallops ABDOMEN:abdomen soft, non-tender and obese BACK: Back symmetric, no curvature. EXTREMITIES:less then 2 second capillary refill, no joint deformities, effusion, or inflammation, no skin discoloration  NEURO: alert & oriented x 3 with fluent speech, no focal motor/sensory deficits, gait normal    LABORATORY DATA: CBC    Component Value Date/Time   WBC 10.3 03/11/2014 1208   RBC 5.96* 03/11/2014 1208   RBC 6.39* 11/27/2013 1545   HGB 17.8* 03/11/2014 1208   HCT 53.5* 03/11/2014 1208   PLT 198 03/11/2014 1208   MCV 89.8 03/11/2014 1208   MCH 29.9 03/11/2014 1208   MCHC 33.3 03/11/2014 1208   RDW 13.7 03/11/2014 1208   LYMPHSABS 1.9 03/11/2014 1208   MONOABS 0.4 03/11/2014  1208   EOSABS 0.2 03/11/2014 1208   BASOSABS 0.1 03/11/2014 1208      Chemistry      Component Value Date/Time   NA 140 11/27/2013 1545   K 4.1 11/27/2013 1545   CL 98 11/27/2013 1545   CO2 29 11/27/2013 1545   BUN 33*  11/27/2013 1545   CREATININE 1.22 11/27/2013 1545   CREATININE 1.27 11/10/2013 1109      Component Value Date/Time   CALCIUM 9.5 11/27/2013 1545   ALKPHOS 130* 11/27/2013 1545   AST 26 11/27/2013 1545   ALT 42 11/27/2013 1545   BILITOT 0.5 11/27/2013 1545        ASSESSMENT AND PLAN:  Polycythemia, secondary Secondary polycythemia in the setting of elevated carboxyhemoglobin at 4.9% (0- 1.5% is normal range) with negative JAK2 testing, BCR/ABL, and EPO level.  S/P 3 therapeutic phlebotomies on 1/14, 1/21, 02/12/2014.  Labs today: CBC diff and ferritin.  To complete the work-up a sleep study should be performed to evaluate for sleep apnea, BUT the patient declines this study again today.  Labs in 6 weeks and 12 weeks: CBC.  Depending on his lab results, this plan may need changed, but I will wait and see.   Return in 12 weeks for follow-up.     Tobacco abuse Smoking cessation education provided.    THERAPY PLAN:  We will continue to monitor his labs and maintain a HCT of less than 50% via phlebotomies.   All questions were answered. The patient knows to call the clinic with any problems, questions or concerns. We can certainly see the patient much sooner if necessary.  Patient and plan discussed with Dr. Ancil Linsey and she is in agreement with the aforementioned.   This note is electronically signed by: Harry James 04/08/2014 11:38 AM

## 2014-04-08 NOTE — Patient Instructions (Signed)
[place] at [facility] Discharge Instructions  RECOMMENDATIONS MADE BY THE CONSULTANT AND ANY TEST RESULTS WILL BE SENT TO YOUR REFERRING PHYSICIAN.  Lab work again in 6 weeks and 12 weeks. MD appointment again in 12 weeks. Return as scheduled.  Thank you for choosing [place] at [facility] to provide your oncology and hematology care.  To afford each patient quality time with our provider, please arrive at least 15 minutes before your scheduled appointment time.    You need to re-schedule your appointment should you arrive 10 or more minutes late.  We strive to give you quality time with our providers, and arriving late affects you and other patients whose appointments are after yours.  Also, if you no show three or more times for appointments you may be dismissed from the clinic at the providers discretion.     Again, thank you for choosing [facility].  Our hope is that these requests will decrease the amount of time that you wait before being seen by our physicians.       _____________________________________________________________  Should you have questions after your visit to [facility], please contact our office at [phone] between the hours of 8:30 a.m. and 4:30 p.m.  Voicemails left after 4:30 p.m. will not be returned until the following business day.  For prescription refill requests, have your pharmacy contact our office.

## 2014-04-08 NOTE — Progress Notes (Addendum)
Labs drawn.....blood was unlabeled. Call for redraw

## 2014-04-14 ENCOUNTER — Other Ambulatory Visit: Payer: Self-pay | Admitting: Family Medicine

## 2014-04-16 ENCOUNTER — Other Ambulatory Visit (HOSPITAL_COMMUNITY): Payer: Self-pay | Admitting: Oncology

## 2014-04-16 ENCOUNTER — Other Ambulatory Visit (HOSPITAL_COMMUNITY): Payer: Self-pay

## 2014-04-16 ENCOUNTER — Encounter (HOSPITAL_COMMUNITY): Payer: Medicare Other | Attending: Hematology and Oncology

## 2014-04-16 DIAGNOSIS — E785 Hyperlipidemia, unspecified: Secondary | ICD-10-CM | POA: Insufficient documentation

## 2014-04-16 DIAGNOSIS — Z8589 Personal history of malignant neoplasm of other organs and systems: Secondary | ICD-10-CM | POA: Insufficient documentation

## 2014-04-16 DIAGNOSIS — J449 Chronic obstructive pulmonary disease, unspecified: Secondary | ICD-10-CM | POA: Insufficient documentation

## 2014-04-16 DIAGNOSIS — E119 Type 2 diabetes mellitus without complications: Secondary | ICD-10-CM | POA: Insufficient documentation

## 2014-04-16 DIAGNOSIS — I10 Essential (primary) hypertension: Secondary | ICD-10-CM | POA: Diagnosis not present

## 2014-04-16 DIAGNOSIS — D751 Secondary polycythemia: Secondary | ICD-10-CM | POA: Diagnosis not present

## 2014-04-16 LAB — CBC WITH DIFFERENTIAL/PLATELET
Basophils Absolute: 0.1 10*3/uL (ref 0.0–0.1)
Basophils Relative: 1 % (ref 0–1)
EOS ABS: 0.2 10*3/uL (ref 0.0–0.7)
EOS PCT: 2 % (ref 0–5)
HEMATOCRIT: 59.3 % — AB (ref 39.0–52.0)
Hemoglobin: 20.1 g/dL — ABNORMAL HIGH (ref 13.0–17.0)
LYMPHS ABS: 1.8 10*3/uL (ref 0.7–4.0)
Lymphocytes Relative: 17 % (ref 12–46)
MCH: 30.1 pg (ref 26.0–34.0)
MCHC: 33.9 g/dL (ref 30.0–36.0)
MCV: 88.9 fL (ref 78.0–100.0)
MONOS PCT: 5 % (ref 3–12)
Monocytes Absolute: 0.5 10*3/uL (ref 0.1–1.0)
Neutro Abs: 8 10*3/uL — ABNORMAL HIGH (ref 1.7–7.7)
Neutrophils Relative %: 75 % (ref 43–77)
Platelets: 184 10*3/uL (ref 150–400)
RBC: 6.67 MIL/uL — AB (ref 4.22–5.81)
RDW: 13.4 % (ref 11.5–15.5)
WBC: 10.6 10*3/uL — AB (ref 4.0–10.5)

## 2014-04-17 LAB — FERRITIN: FERRITIN: 43 ng/mL (ref 22–322)

## 2014-04-23 ENCOUNTER — Encounter (HOSPITAL_COMMUNITY)
Admission: RE | Admit: 2014-04-23 | Discharge: 2014-04-23 | Disposition: A | Discharge status: Home / Self Care | Payer: Medicare Other | Source: Ambulatory Visit | Attending: Hematology & Oncology | Admitting: Hematology & Oncology

## 2014-04-23 DIAGNOSIS — D751 Secondary polycythemia: Secondary | ICD-10-CM | POA: Diagnosis not present

## 2014-04-23 NOTE — Progress Notes (Signed)
Harry James presents today for phlebotomy per MD orders. HGB/HCT:n/a Phlebotomy procedure started at 1033 and ended at 1045. 1.5 lbs removed. Patient tolerated procedure well. IV needle removed intact.

## 2014-04-27 ENCOUNTER — Other Ambulatory Visit: Payer: Self-pay | Admitting: Family Medicine

## 2014-04-30 ENCOUNTER — Encounter (HOSPITAL_COMMUNITY): Payer: Self-pay

## 2014-04-30 ENCOUNTER — Encounter (HOSPITAL_COMMUNITY)
Admission: RE | Admit: 2014-04-30 | Discharge: 2014-04-30 | Disposition: A | Discharge status: Home / Self Care | Payer: Medicare Other | Source: Ambulatory Visit | Attending: Hematology & Oncology | Admitting: Hematology & Oncology

## 2014-04-30 DIAGNOSIS — D751 Secondary polycythemia: Secondary | ICD-10-CM | POA: Diagnosis not present

## 2014-04-30 NOTE — Progress Notes (Signed)
Harry James presents today for phlebotomy per MD orders.  Phlebotomy procedure started at 1110 and ended at 1125 20 oz removed. Patient tolerated procedure well. IV needle removed intact.

## 2014-05-07 ENCOUNTER — Encounter (HOSPITAL_COMMUNITY)
Admission: RE | Admit: 2014-05-07 | Discharge: 2014-05-07 | Disposition: A | Discharge status: Home / Self Care | Payer: Medicare Other | Source: Ambulatory Visit | Attending: Hematology & Oncology | Admitting: Hematology & Oncology

## 2014-05-07 DIAGNOSIS — D751 Secondary polycythemia: Secondary | ICD-10-CM | POA: Diagnosis not present

## 2014-05-07 NOTE — Progress Notes (Signed)
Harry James presents today for phlebotomy per MD orders. HGB/HCT: Phlebotomy procedure started at 11:16 and ended at 1200.  12.4 oz  removed. Patient tolerated procedure well. IV needle removed intact. notifed dr Whitney Muse that phlebotomy stopped at 12.4 ok to stop procedure

## 2014-05-20 ENCOUNTER — Telehealth (HOSPITAL_COMMUNITY): Payer: Self-pay | Admitting: *Deleted

## 2014-05-20 ENCOUNTER — Encounter (HOSPITAL_COMMUNITY): Payer: Medicare Other | Attending: Hematology and Oncology

## 2014-05-20 DIAGNOSIS — E785 Hyperlipidemia, unspecified: Secondary | ICD-10-CM | POA: Insufficient documentation

## 2014-05-20 DIAGNOSIS — J449 Chronic obstructive pulmonary disease, unspecified: Secondary | ICD-10-CM | POA: Insufficient documentation

## 2014-05-20 DIAGNOSIS — Z8589 Personal history of malignant neoplasm of other organs and systems: Secondary | ICD-10-CM | POA: Diagnosis not present

## 2014-05-20 DIAGNOSIS — D751 Secondary polycythemia: Secondary | ICD-10-CM | POA: Diagnosis not present

## 2014-05-20 DIAGNOSIS — I10 Essential (primary) hypertension: Secondary | ICD-10-CM | POA: Insufficient documentation

## 2014-05-20 DIAGNOSIS — E119 Type 2 diabetes mellitus without complications: Secondary | ICD-10-CM | POA: Diagnosis not present

## 2014-05-20 LAB — CBC WITH DIFFERENTIAL/PLATELET
BASOS PCT: 1 % (ref 0–1)
Basophils Absolute: 0.1 10*3/uL (ref 0.0–0.1)
EOS PCT: 3 % (ref 0–5)
Eosinophils Absolute: 0.3 10*3/uL (ref 0.0–0.7)
HEMATOCRIT: 52.6 % — AB (ref 39.0–52.0)
Hemoglobin: 17.1 g/dL — ABNORMAL HIGH (ref 13.0–17.0)
Lymphocytes Relative: 20 % (ref 12–46)
Lymphs Abs: 2.1 10*3/uL (ref 0.7–4.0)
MCH: 28.3 pg (ref 26.0–34.0)
MCHC: 32.5 g/dL (ref 30.0–36.0)
MCV: 86.9 fL (ref 78.0–100.0)
MONO ABS: 0.5 10*3/uL (ref 0.1–1.0)
MONOS PCT: 5 % (ref 3–12)
NEUTROS ABS: 7.5 10*3/uL (ref 1.7–7.7)
Neutrophils Relative %: 71 % (ref 43–77)
Platelets: 205 10*3/uL (ref 150–400)
RBC: 6.05 MIL/uL — AB (ref 4.22–5.81)
RDW: 13.4 % (ref 11.5–15.5)
WBC: 10.4 10*3/uL (ref 4.0–10.5)

## 2014-05-20 NOTE — Progress Notes (Signed)
LABS DRAWN

## 2014-05-20 NOTE — Telephone Encounter (Signed)
Patient and short stay notified of need for phlebotomy x 2

## 2014-05-28 ENCOUNTER — Encounter (HOSPITAL_COMMUNITY)
Admission: RE | Admit: 2014-05-28 | Discharge: 2014-05-28 | Disposition: A | Discharge status: Home / Self Care | Payer: Medicare Other | Source: Ambulatory Visit | Attending: Hematology & Oncology | Admitting: Hematology & Oncology

## 2014-05-28 DIAGNOSIS — F17200 Nicotine dependence, unspecified, uncomplicated: Secondary | ICD-10-CM | POA: Diagnosis not present

## 2014-05-28 DIAGNOSIS — D751 Secondary polycythemia: Secondary | ICD-10-CM | POA: Insufficient documentation

## 2014-05-28 NOTE — Progress Notes (Signed)
Arrived for therapeutic phlebotomy. Procedure performed without difficulty with 1.2 lbs blood obtained. Tolerated well.

## 2014-06-04 ENCOUNTER — Encounter (HOSPITAL_COMMUNITY)
Admission: RE | Admit: 2014-06-04 | Discharge: 2014-06-04 | Disposition: A | Discharge status: Home / Self Care | Payer: Medicare Other | Source: Ambulatory Visit | Attending: Hematology & Oncology | Admitting: Hematology & Oncology

## 2014-06-04 DIAGNOSIS — F17200 Nicotine dependence, unspecified, uncomplicated: Secondary | ICD-10-CM | POA: Diagnosis not present

## 2014-06-04 DIAGNOSIS — D751 Secondary polycythemia: Secondary | ICD-10-CM | POA: Diagnosis not present

## 2014-06-04 NOTE — Progress Notes (Signed)
Lenox Ponds presents today for phlebotomy per MD orders. HGB/HCT:n/a Phlebotomy procedure started at 1107 and ended at 39. 6oz removed. Could not obtain any more blood. LAC IV needle removed intact.  Phlebotomy procedure started at 1119 and ended at 1124. 1.2lbs removed. Patient tolerated procedure well. RAC IV needle removed intact.

## 2014-07-01 ENCOUNTER — Encounter (HOSPITAL_COMMUNITY): Payer: Medicare Other | Attending: Hematology & Oncology

## 2014-07-01 ENCOUNTER — Encounter (HOSPITAL_COMMUNITY): Payer: Self-pay | Admitting: Hematology & Oncology

## 2014-07-01 ENCOUNTER — Encounter (HOSPITAL_BASED_OUTPATIENT_CLINIC_OR_DEPARTMENT_OTHER): Payer: Medicare Other | Admitting: Hematology & Oncology

## 2014-07-01 VITALS — BP 143/72 | HR 92 | Temp 98.1°F | Resp 18 | Wt 227.6 lb

## 2014-07-01 DIAGNOSIS — F17200 Nicotine dependence, unspecified, uncomplicated: Secondary | ICD-10-CM

## 2014-07-01 DIAGNOSIS — D751 Secondary polycythemia: Secondary | ICD-10-CM | POA: Insufficient documentation

## 2014-07-01 DIAGNOSIS — Z720 Tobacco use: Secondary | ICD-10-CM

## 2014-07-01 LAB — CBC WITH DIFFERENTIAL/PLATELET
BASOS ABS: 0.1 10*3/uL (ref 0.0–0.1)
BASOS PCT: 1 % (ref 0–1)
EOS ABS: 0.2 10*3/uL (ref 0.0–0.7)
Eosinophils Relative: 2 % (ref 0–5)
HEMATOCRIT: 50.4 % (ref 39.0–52.0)
HEMOGLOBIN: 16.5 g/dL (ref 13.0–17.0)
Lymphocytes Relative: 18 % (ref 12–46)
Lymphs Abs: 2.1 10*3/uL (ref 0.7–4.0)
MCH: 27.3 pg (ref 26.0–34.0)
MCHC: 32.7 g/dL (ref 30.0–36.0)
MCV: 83.4 fL (ref 78.0–100.0)
MONO ABS: 0.5 10*3/uL (ref 0.1–1.0)
MONOS PCT: 4 % (ref 3–12)
NEUTROS ABS: 8.5 10*3/uL — AB (ref 1.7–7.7)
Neutrophils Relative %: 75 % (ref 43–77)
PLATELETS: 225 10*3/uL (ref 150–400)
RBC: 6.04 MIL/uL — AB (ref 4.22–5.81)
RDW: 13.7 % (ref 11.5–15.5)
WBC: 11.4 10*3/uL — AB (ref 4.0–10.5)

## 2014-07-01 NOTE — Patient Instructions (Signed)
..  [place] at [facility] Discharge Instructions  RECOMMENDATIONS MADE BY THE CONSULTANT AND ANY TEST RESULTS WILL BE SENT TO YOUR REFERRING PHYSICIAN.  Exam per Dr. Whitney Muse  Call [phone] to schedule labs (CBC) if needed   Return in 2 months  Thank you for choosing [place] at [facility] to provide your oncology and hematology care.  To afford each patient quality time with our provider, please arrive at least 15 minutes before your scheduled appointment time.    You need to re-schedule your appointment should you arrive 10 or more minutes late.  We strive to give you quality time with our providers, and arriving late affects you and other patients whose appointments are after yours.  Also, if you no show three or more times for appointments you may be dismissed from the clinic at the providers discretion.     Again, thank you for choosing [facility].  Our hope is that these requests will decrease the amount of time that you wait before being seen by our physicians.       _____________________________________________________________  Should you have questions after your visit to [facility], please contact our office at [phone] between the hours of 8:30 a.m. and 4:30 p.m.  Voicemails left after 4:30 p.m. will not be returned until the following business day.  For prescription refill requests, have your pharmacy contact our office.

## 2014-07-01 NOTE — Progress Notes (Signed)
..  Darliss Cheney Julian's reason for visit today are for labs as scheduled per MD orders.  Venipuncture performed with a 23 gauge butterfly needle to L Antecubital.  Lenox Ponds tolerated venipuncture well and without incident; questions were answered and patient was discharged.

## 2014-07-01 NOTE — Progress Notes (Signed)
Harry Fraction, MD [address] [place] [address]    DIAGNOSIS: Secondary Polycythemia  CURRENT THERAPY: phlebotomy  INTERVAL HISTORY: Harry James 64 y.o. male returns for polycythemia, he is JAK 2 negative. He has had a hemoglobin as high as 22. He reports he feels well. He has smoked since he was a teenager and has averaged a pack a day his entire life. He has never had pulmonary function tests. He thinks he sleeps fine. Approximately 7 hours a night and reports he only naps occasionally during the day. He wakes up and feels rested. He is overweight. He denies any headaches, blurry vision, but does complain of shortness of breath which is worsening. He says going up stairs is not something he enjoys.  He is present alone today and says that in general, he is feeling better than he has felt in awhile. He has no complaints. He denies headache, blurry vision, nausea or chest pain.  MEDICAL HISTORY: Past Medical History  Diagnosis Date  . Hypertension   . Hyperlipidemia   . Diabetes mellitus     prediabetes  . Ulcer     perforated duodenal ulcer 12/12  . Tobacco abuse   . Chronic kidney disease     has one functional kidney  . Polycythemia, secondary 01/25/2014    has Perforated duodenal ulcer; Fx humeral neck; Obesity (BMI 30-39.9); Renal insufficiency; Hypertension; History of ETOH abuse; Respiratory failure following trauma and surgery; COPD, severity to be determined; Dissecting aneurysm of thoracic aorta, Stanford type B; Tobacco dependence; Hyperlipidemia; Diabetes mellitus; Ulcer; Tobacco abuse; Chronic kidney disease; and Polycythemia, secondary on his problem list.     has No Known Allergies.  Harry James had no medications administered during this visit.  SURGICAL HISTORY: Past Surgical History  Procedure Laterality Date  . Cystectomy  under tongue  . Laparotomy  12/16/2010    [name] patch perf du Procedure: EXPLORATORY LAPAROTOMY;  Surgeon:  [name], MD;  Location: [facility];  Service: General;  Laterality: N/A;    SOCIAL HISTORY: History   Social History  . Marital Status: Divorced    Spouse Name: N/A  . Number of Children: N/A  . Years of Education: N/A   Occupational History  . Not on file.   Social History Main Topics  . Smoking status: Current Every Day Smoker -- 1.00 packs/day    Types: Cigarettes  . Smokeless tobacco: Never Used  . Alcohol Use: Yes     Comment: daily  . Drug Use: No  . Sexual Activity: Not on file   Other Topics Concern  . Not on file   Social History Narrative    FAMILY HISTORY: No family history on file.  Review of Systems  Constitutional: Negative.   HENT: Negative.   Eyes: Negative.   Respiratory: SOB, chronic Cardiovascular: Negative.   Gastrointestinal: Negative.   Genitourinary: Negative.   Musculoskeletal: Negative.  Skin: Negative.   Neurological: Negative.   Endo/Heme/Allergies: Negative.   Psychiatric/Behavioral: Negative.   14 point review of systems was performed and is negative except as detailed under history of present illness and above   PHYSICAL EXAMINATION  ECOG PERFORMANCE STATUS: 1 - Symptomatic but completely ambulatory  There were no vitals filed for this visit.  Physical Exam  Constitutional: He is oriented to person, place, and time and well-developed, well-nourished, and in no distress.  Obese, short neck, obese abdomen  HENT:  Head: Normocephalic and atraumatic.  Nose: Nose normal.  Mouth/Throat: Oropharynx is clear and moist. No oropharyngeal exudate.  Eyes: Conjunctivae and EOM are normal. Pupils are equal, round, and reactive to light. Right eye exhibits no discharge. Left eye exhibits no discharge. No scleral icterus.  Neck: Normal range of motion. Neck supple. No tracheal deviation present. No thyromegaly present.  Cardiovascular: Normal rate, regular rhythm and normal heart sounds.  Exam reveals no gallop and no friction rub.     No murmur heard. Pulmonary/Chest: Effort normal and breath sounds normal. He has no wheezes. He has no rales. occasional rhonchi Abdominal: Soft. Bowel sounds are normal. He exhibits no distension and no mass. There is no tenderness. There is no rebound and no guarding.  Musculoskeletal: Normal range of motion. He exhibits no edema.  Lymphadenopathy:    He has no cervical adenopathy.  Neurological: He is alert and oriented to person, place, and time. He has normal reflexes. No cranial nerve deficit. Gait normal. Coordination normal.  Skin: Skin is warm and dry. No rash noted.  Psychiatric: Mood, memory, affect and judgment normal.  Nursing note and vitals reviewed.   LABORATORY DATA:  CBC    Component Value Date/Time   WBC 10.4 05/20/2014 1116   RBC 6.05* 05/20/2014 1116   RBC 6.39* 11/27/2013 1545   HGB 17.1* 05/20/2014 1116   HCT 52.6* 05/20/2014 1116   PLT 205 05/20/2014 1116   MCV 86.9 05/20/2014 1116   MCH 28.3 05/20/2014 1116   MCHC 32.5 05/20/2014 1116   RDW 13.4 05/20/2014 1116   LYMPHSABS 2.1 05/20/2014 1116   MONOABS 0.5 05/20/2014 1116   EOSABS 0.3 05/20/2014 1116   BASOSABS 0.1 05/20/2014 1116   CMP     Component Value Date/Time   NA 140 11/27/2013 1545   K 4.1 11/27/2013 1545   CL 98 11/27/2013 1545   CO2 29 11/27/2013 1545   GLUCOSE 199* 11/27/2013 1545   BUN 33* 11/27/2013 1545   CREATININE 1.22 11/27/2013 1545   CREATININE 1.27 11/10/2013 1109   CALCIUM 9.5 11/27/2013 1545   PROT 7.5 11/27/2013 1545   ALBUMIN 3.9 11/27/2013 1545   AST 26 11/27/2013 1545   ALT 42 11/27/2013 1545   ALKPHOS 130* 11/27/2013 1545   BILITOT 0.5 11/27/2013 1545   GFRNONAA 61* 11/27/2013 1545   GFRNONAA 59* 11/10/2013 1109   GFRAA 71* 11/27/2013 1545   GFRAA 69 11/10/2013 1109    ASSESSMENT and THERAPY PLAN:   Polycythemia, JAK2 negative, secondary Tobacco Abuse  64 year old male with secondary polycythemia. Hemoglobin has been as high as 22. He does not give  symptoms consistent with sleep apnea. He is a heavy smoker. A carboxyhemoglobin level was 4.9% in November 2015. JAK 2 V617F testing is negative and reflex testing in EXON 12 and 13 is also negative.  Erythropoietin level was WNL, and not supressed.   I have recommended follow-up again in 2 months. We again addressed smoking cessation in detail today. He was given multiple suggestions to aid in quitting. We will continue to work on this moving forward.  Orders Placed This Encounter  Procedures  . CBC with Differential    Standing Status: Future     Number of Occurrences:      Standing Expiration Date: 07/01/2015  . Ferritin    Standing Status: Future     Number of Occurrences:      Standing Expiration Date: 07/01/2015    All questions were answered. The patient knows to call the clinic with any problems, questions or concerns. We can  certainly see the patient much sooner if necessary. //  This note was electronically signed.  This document serves as a record of services personally performed by Ancil Linsey, MD. It was created on her behalf by Janace Hoard, a trained medical scribe. The creation of this record is based on the scribe's personal observations and the provider's statements to them. This document has been checked and approved by the attending provider.  I have reviewed the above documentation for accuracy and completeness, and I agree with the above.  Harry Fam. Whitney Muse, MD

## 2014-07-11 ENCOUNTER — Other Ambulatory Visit: Payer: Self-pay | Admitting: Family Medicine

## 2014-08-11 ENCOUNTER — Other Ambulatory Visit: Payer: Self-pay | Admitting: Family Medicine

## 2014-09-01 ENCOUNTER — Encounter (HOSPITAL_COMMUNITY): Payer: Medicare Other

## 2014-09-01 ENCOUNTER — Encounter (HOSPITAL_COMMUNITY): Payer: Medicare Other | Attending: Hematology & Oncology | Admitting: Hematology & Oncology

## 2014-09-01 ENCOUNTER — Ambulatory Visit (HOSPITAL_COMMUNITY): Payer: Medicare Other | Admitting: Hematology & Oncology

## 2014-09-01 VITALS — BP 137/76 | HR 72 | Temp 97.6°F | Resp 22 | Wt 229.0 lb

## 2014-09-01 DIAGNOSIS — F17200 Nicotine dependence, unspecified, uncomplicated: Secondary | ICD-10-CM | POA: Diagnosis not present

## 2014-09-01 DIAGNOSIS — D751 Secondary polycythemia: Secondary | ICD-10-CM | POA: Diagnosis not present

## 2014-09-01 DIAGNOSIS — J449 Chronic obstructive pulmonary disease, unspecified: Secondary | ICD-10-CM

## 2014-09-01 LAB — CBC WITH DIFFERENTIAL/PLATELET
BASOS ABS: 0.1 10*3/uL (ref 0.0–0.1)
BASOS PCT: 1 % (ref 0–1)
EOS PCT: 2 % (ref 0–5)
Eosinophils Absolute: 0.2 10*3/uL (ref 0.0–0.7)
HCT: 52.2 % — ABNORMAL HIGH (ref 39.0–52.0)
Hemoglobin: 16.8 g/dL (ref 13.0–17.0)
Lymphocytes Relative: 16 % (ref 12–46)
Lymphs Abs: 1.5 10*3/uL (ref 0.7–4.0)
MCH: 26.5 pg (ref 26.0–34.0)
MCHC: 32.2 g/dL (ref 30.0–36.0)
MCV: 82.3 fL (ref 78.0–100.0)
MONO ABS: 0.5 10*3/uL (ref 0.1–1.0)
Monocytes Relative: 5 % (ref 3–12)
Neutro Abs: 7.2 10*3/uL (ref 1.7–7.7)
Neutrophils Relative %: 76 % (ref 43–77)
PLATELETS: 199 10*3/uL (ref 150–400)
RBC: 6.34 MIL/uL — ABNORMAL HIGH (ref 4.22–5.81)
RDW: 16 % — ABNORMAL HIGH (ref 11.5–15.5)
WBC: 9.3 10*3/uL (ref 4.0–10.5)

## 2014-09-01 LAB — FERRITIN: Ferritin: 14 ng/mL — ABNORMAL LOW (ref 24–336)

## 2014-09-01 NOTE — Progress Notes (Signed)
Harry Fraction, MD [address] [place] [address]    DIAGNOSIS: Secondary Polycythemia  CURRENT THERAPY: phlebotomy  INTERVAL HISTORY: Harry James 64 y.o. male returns for polycythemia, he is JAK 2 negative. He has had a hemoglobin as high as 22. He reports he feels well. He has smoked since he was a teenager and has averaged a pack a day his entire life.   The patient is here alone today.  With regards to his continued smoking, but failed attempts to quit, Harry James states "I just keep trying." He says that he has "cut back some, but not enough to really brag about." He has not attended any classes to assist in his smoking cessation. He expresses distaste for assistance in his smoking cessation, because "I have quit before a couple times, and when I did, it was just a matter of making my mind up and just doing it." However, he also states that he does not know how to make his mind up this time. "I'm telling you, it will happen though." He declares that he is convinced to quit smoking, but that "it's doing it that's hard."  Harry James says he's feeling okay and eating okay otherwise, and sleeping okay. He states that "sleeping's the easiest thing in the world." He says if he needs to take a nap, he can just sit on the couch and take a nap with the TV on. He is still not interested in pursuing a sleep study.  MEDICAL HISTORY: Past Medical History  Diagnosis Date  . Hypertension   . Hyperlipidemia   . Diabetes mellitus     prediabetes  . Ulcer     perforated duodenal ulcer 12/12  . Tobacco abuse   . Chronic kidney disease     has one functional kidney  . Polycythemia, secondary 01/25/2014    has Perforated duodenal ulcer; Fx humeral neck; Obesity (BMI 30-39.9); Renal insufficiency; Hypertension; History of ETOH abuse; Respiratory failure following trauma and surgery; COPD, severity to be determined; Dissecting aneurysm of thoracic aorta, Stanford type B;  Tobacco dependence; Hyperlipidemia; Diabetes mellitus; Ulcer; Tobacco abuse; Chronic kidney disease; and Polycythemia, secondary on his problem list.     has No Known Allergies.  Harry James does not currently have medications on file.  SURGICAL HISTORY: Past Surgical History  Procedure Laterality Date  . Cystectomy  under tongue  . Laparotomy  12/16/2010    [name] patch perf du Procedure: EXPLORATORY LAPAROTOMY;  Surgeon: [name], MD;  Location: [facility];  Service: General;  Laterality: N/A;    SOCIAL HISTORY: Social History   Social History  . Marital Status: Divorced    Spouse Name: N/A  . Number of Children: N/A  . Years of Education: N/A   Occupational History  . Not on file.   Social History Main Topics  . Smoking status: Current Every Day Smoker -- 1.00 packs/day    Types: Cigarettes  . Smokeless tobacco: Never Used  . Alcohol Use: Yes     Comment: daily  . Drug Use: No  . Sexual Activity: Not on file   Other Topics Concern  . Not on file   Social History Narrative    FAMILY HISTORY: No family history on file.  Review of Systems  Constitutional: Negative.   HENT: Negative.   Eyes: Negative.   Respiratory: SOB, chronic Cardiovascular: Negative.   Gastrointestinal: Negative.   Genitourinary: Negative.   Musculoskeletal: Negative.  Skin: Negative.  Neurological: Negative.   Endo/Heme/Allergies: Negative.   Psychiatric/Behavioral: Negative.   14 point review of systems was performed and is negative except as detailed under history of present illness and above   PHYSICAL EXAMINATION  ECOG PERFORMANCE STATUS: 1 - Symptomatic but completely ambulatory  Filed Vitals:   09/01/14 1045  BP: 137/76  Pulse: 72  Temp: 97.6 F (36.4 C)  Resp: 22    Physical Exam  Constitutional: He is oriented to person, place, and time and well-developed, well-nourished, and in no distress.  Obese, short neck, obese abdomen. Able to get up and walk to the exam  table easily and without assistance. HENT:  Head: Normocephalic and atraumatic.  Nose: Nose normal.  Mouth/Throat: Oropharynx is clear and moist. No oropharyngeal exudate.  Eyes: Conjunctivae and EOM are normal. Pupils are equal, round, and reactive to light. Right eye exhibits no discharge. Left eye exhibits no discharge. No scleral icterus.  Neck: Normal range of motion. Neck supple. No tracheal deviation present. No thyromegaly present.  Cardiovascular: Normal rate, regular rhythm and normal heart sounds.  Exam reveals no gallop and no friction rub.   No murmur heard. Pulmonary/Chest: Effort normal and breath sounds normal. He has no wheezes. He has no rales. occasional rhonchi Abdominal: Soft. Bowel sounds are normal. He exhibits no distension and no mass. There is no tenderness. There is no rebound and no guarding.  Musculoskeletal: Normal range of motion. He exhibits no edema.  Lymphadenopathy:    He has no cervical adenopathy.  Neurological: He is alert and oriented to person, place, and time. He has normal reflexes. No cranial nerve deficit. Gait normal. Coordination normal.  Skin: Skin is warm and dry. No rash noted.  Psychiatric: Mood, memory, affect and judgment normal.  Nursing note and vitals reviewed.   LABORATORY DATA: I have reviewed the laboratory data below:  Results for BILLEY, TOEWS (MRN [id number]) as of 10/03/2014 16:11  Ref. Range 09/01/2014 11:03  Ferritin Latest Ref Range: 24-336 ng/mL 14 (L)  WBC Latest Ref Range: 4.0-10.5 K/uL 9.3  RBC Latest Ref Range: 4.22-5.81 MIL/uL 6.34 (H)  Hemoglobin Latest Ref Range: 13.0-17.0 g/dL 16.8  HCT Latest Ref Range: 39.0-52.0 % 52.2 (H)  MCV Latest Ref Range: 78.0-100.0 fL 82.3  MCH Latest Ref Range: 26.0-34.0 pg 26.5  MCHC Latest Ref Range: 30.0-36.0 g/dL 32.2  RDW Latest Ref Range: 11.5-15.5 % 16.0 (H)  Platelets Latest Ref Range: 150-400 K/uL 199  Neutrophils Latest Ref Range: 43-77 % 76  Lymphocytes Latest Ref  Range: 12-46 % 16  Monocytes Relative Latest Ref Range: 3-12 % 5  Eosinophil Latest Ref Range: 0-5 % 2  Basophil Latest Ref Range: 0-1 % 1  NEUT# Latest Ref Range: 1.7-7.7 K/uL 7.2  Lymphocyte # Latest Ref Range: 0.7-4.0 K/uL 1.5  Monocyte # Latest Ref Range: 0.1-1.0 K/uL 0.5  Eosinophils Absolute Latest Ref Range: 0.0-0.7 K/uL 0.2  Basophils Absolute Latest Ref Range: 0.0-0.1 K/uL 0.1     ASSESSMENT and THERAPY PLAN:   Polycythemia, JAK2 negative, secondary Tobacco Abuse Iron deficiency secondary to phlebotomy  64 year old male with secondary polycythemia. Hemoglobin has been as high as 22. He does not give symptoms consistent with sleep apnea. He is a heavy smoker. A carboxyhemoglobin level was 4.9% in November 2015. JAK 2 V617F testing is negative and reflex testing in EXON 12 and 13 is also negative.  Erythropoietin level was WNL, and not supressed.   We discussed smoking cessation with Harry James again today. He was again  given suggestions to aid in quitting.  We will check on his blood work again in 2 months, and follow-up and see him again in 4 months. Goal is to keep his HCT around 50. We have discussed that the ideal HCT in secondary polycythemia is uncertain.  All questions were answered. The patient knows to call the clinic with any problems, questions or concerns. We can certainly see the patient much sooner if necessary.  This note was electronically signed.  This document serves as a record of services personally performed by Ancil Linsey, MD. It was created on her behalf by Toni Amend, a trained medical scribe. The creation of this record is based on the scribe's personal observations and the provider's statements to them. This document has been checked and approved by the attending provider.  I have reviewed the above documentation for accuracy and completeness, and I agree with the above.  Kelby Fam. Whitney Muse, MD

## 2014-09-01 NOTE — Patient Instructions (Signed)
[place] at [facility] Discharge Instructions  RECOMMENDATIONS MADE BY THE CONSULTANT AND ANY TEST RESULTS WILL BE SENT TO YOUR REFERRING PHYSICIAN.  Exam/discussion per Dr.Penland. Lab work again in 2 months and 4 months. MD appointment in 4 months. Return as scheduled.  Thank you for choosing [place] at [facility] to provide your oncology and hematology care.  To afford each patient quality time with our provider, please arrive at least 15 minutes before your scheduled appointment time.    You need to re-schedule your appointment should you arrive 10 or more minutes late.  We strive to give you quality time with our providers, and arriving late affects you and other patients whose appointments are after yours.  Also, if you no show three or more times for appointments you may be dismissed from the clinic at the providers discretion.     Again, thank you for choosing [facility].  Our hope is that these requests will decrease the amount of time that you wait before being seen by our physicians.       _____________________________________________________________  Should you have questions after your visit to [facility], please contact our office at [phone] between the hours of 8:30 a.m. and 4:30 p.m.  Voicemails left after 4:30 p.m. will not be returned until the following business day.  For prescription refill requests, have your pharmacy contact our office.

## 2014-09-02 ENCOUNTER — Other Ambulatory Visit (HOSPITAL_COMMUNITY): Payer: Medicare Other

## 2014-09-02 NOTE — Progress Notes (Signed)
Lab drawn.

## 2014-09-11 ENCOUNTER — Other Ambulatory Visit: Payer: Self-pay | Admitting: Family Medicine

## 2014-10-03 ENCOUNTER — Encounter (HOSPITAL_COMMUNITY): Payer: Self-pay | Admitting: Hematology & Oncology

## 2014-10-13 ENCOUNTER — Other Ambulatory Visit: Payer: Self-pay | Admitting: Family Medicine

## 2014-10-13 NOTE — Telephone Encounter (Signed)
Request for 90 day supply refused.   Patient requires labs and F/U appointment.

## 2014-10-13 NOTE — Telephone Encounter (Signed)
Medication filled x1 with no refills.   Requires office visit before any further refills can be given.   Letter sent.  

## 2014-11-01 ENCOUNTER — Encounter (HOSPITAL_COMMUNITY): Payer: Medicare Other | Attending: Hematology & Oncology

## 2014-11-01 DIAGNOSIS — D751 Secondary polycythemia: Secondary | ICD-10-CM | POA: Diagnosis not present

## 2014-11-01 DIAGNOSIS — F17200 Nicotine dependence, unspecified, uncomplicated: Secondary | ICD-10-CM | POA: Insufficient documentation

## 2014-11-01 DIAGNOSIS — J449 Chronic obstructive pulmonary disease, unspecified: Secondary | ICD-10-CM | POA: Diagnosis not present

## 2014-11-01 LAB — CBC WITH DIFFERENTIAL/PLATELET
Basophils Absolute: 0.1 10*3/uL (ref 0.0–0.1)
Basophils Relative: 1 %
EOS ABS: 0.2 10*3/uL (ref 0.0–0.7)
EOS PCT: 2 %
HCT: 54 % — ABNORMAL HIGH (ref 39.0–52.0)
Hemoglobin: 18 g/dL — ABNORMAL HIGH (ref 13.0–17.0)
Lymphocytes Relative: 17 %
Lymphs Abs: 1.7 10*3/uL (ref 0.7–4.0)
MCH: 27.9 pg (ref 26.0–34.0)
MCHC: 33.3 g/dL (ref 30.0–36.0)
MCV: 83.6 fL (ref 78.0–100.0)
MONO ABS: 0.5 10*3/uL (ref 0.1–1.0)
MONOS PCT: 6 %
Neutro Abs: 7.4 10*3/uL (ref 1.7–7.7)
Neutrophils Relative %: 74 %
PLATELETS: 191 10*3/uL (ref 150–400)
RBC: 6.46 MIL/uL — ABNORMAL HIGH (ref 4.22–5.81)
RDW: 17.2 % — AB (ref 11.5–15.5)
WBC: 9.9 10*3/uL (ref 4.0–10.5)

## 2014-11-01 LAB — FERRITIN: FERRITIN: 15 ng/mL — AB (ref 24–336)

## 2014-11-01 NOTE — Progress Notes (Signed)
Labs drawn

## 2014-11-05 ENCOUNTER — Other Ambulatory Visit: Payer: Medicare Other

## 2014-11-05 DIAGNOSIS — Z125 Encounter for screening for malignant neoplasm of prostate: Secondary | ICD-10-CM

## 2014-11-05 DIAGNOSIS — Z79899 Other long term (current) drug therapy: Secondary | ICD-10-CM

## 2014-11-05 DIAGNOSIS — E118 Type 2 diabetes mellitus with unspecified complications: Secondary | ICD-10-CM | POA: Diagnosis not present

## 2014-11-05 DIAGNOSIS — IMO0002 Reserved for concepts with insufficient information to code with codable children: Secondary | ICD-10-CM

## 2014-11-05 DIAGNOSIS — E1165 Type 2 diabetes mellitus with hyperglycemia: Secondary | ICD-10-CM

## 2014-11-05 DIAGNOSIS — N289 Disorder of kidney and ureter, unspecified: Secondary | ICD-10-CM

## 2014-11-05 DIAGNOSIS — I10 Essential (primary) hypertension: Secondary | ICD-10-CM | POA: Diagnosis not present

## 2014-11-05 DIAGNOSIS — Z0000 Encounter for general adult medical examination without abnormal findings: Secondary | ICD-10-CM

## 2014-11-05 LAB — LIPID PANEL
CHOLESTEROL: 169 mg/dL (ref 125–200)
HDL: 36 mg/dL — ABNORMAL LOW (ref >=40)
LDL Cholesterol: 71 mg/dL (ref <130)
TRIGLYCERIDES: 311 mg/dL — AB (ref <150)
Total CHOL/HDL Ratio: 4.7 Ratio (ref <=5.0)
VLDL: 62 mg/dL — ABNORMAL HIGH (ref <30)

## 2014-11-05 LAB — COMPLETE METABOLIC PANEL WITH GFR
ALT: 25 U/L (ref 9–46)
AST: 16 U/L (ref 10–35)
Albumin: 4.1 g/dL (ref 3.6–5.1)
Alkaline Phosphatase: 99 U/L (ref 40–115)
BILIRUBIN TOTAL: 0.7 mg/dL (ref 0.2–1.2)
BUN: 29 mg/dL — ABNORMAL HIGH (ref 7–25)
CALCIUM: 9.4 mg/dL (ref 8.6–10.3)
CO2: 27 mmol/L (ref 20–31)
CREATININE: 1.1 mg/dL (ref 0.70–1.25)
Chloride: 99 mmol/L (ref 98–110)
GFR, Est African American: 81 mL/min (ref >=60)
GFR, Est Non African American: 70 mL/min (ref >=60)
Glucose, Bld: 104 mg/dL — ABNORMAL HIGH (ref 70–99)
Potassium: 4.1 mmol/L (ref 3.5–5.3)
Sodium: 137 mmol/L (ref 135–146)
TOTAL PROTEIN: 6.9 g/dL (ref 6.1–8.1)

## 2014-11-05 LAB — TSH: TSH: 0.981 u[IU]/mL (ref 0.350–4.500)

## 2014-11-05 LAB — HEMOGLOBIN A1C
HEMOGLOBIN A1C: 6.6 % — AB (ref <5.7)
MEAN PLASMA GLUCOSE: 143 mg/dL — AB (ref <117)

## 2014-11-06 LAB — PSA, MEDICARE: PSA: 1.45 ng/mL (ref <=4.00)

## 2014-11-09 ENCOUNTER — Other Ambulatory Visit: Payer: Self-pay | Admitting: Family Medicine

## 2014-11-09 NOTE — Telephone Encounter (Signed)
Medication refilled per protocol. 

## 2014-11-12 ENCOUNTER — Other Ambulatory Visit: Payer: Self-pay | Admitting: Family Medicine

## 2014-11-12 NOTE — Telephone Encounter (Signed)
Medication refilled per protocol. 

## 2014-11-13 ENCOUNTER — Other Ambulatory Visit: Payer: Self-pay | Admitting: Family Medicine

## 2014-11-15 ENCOUNTER — Encounter: Payer: Self-pay | Admitting: Family Medicine

## 2014-11-15 ENCOUNTER — Ambulatory Visit (INDEPENDENT_AMBULATORY_CARE_PROVIDER_SITE_OTHER): Payer: Medicare Other | Admitting: Family Medicine

## 2014-11-15 VITALS — BP 132/80 | HR 84 | Temp 97.4°F | Resp 22 | Ht 66.0 in | Wt 227.0 lb

## 2014-11-15 DIAGNOSIS — I10 Essential (primary) hypertension: Secondary | ICD-10-CM

## 2014-11-15 DIAGNOSIS — Z0000 Encounter for general adult medical examination without abnormal findings: Secondary | ICD-10-CM | POA: Diagnosis not present

## 2014-11-15 DIAGNOSIS — E119 Type 2 diabetes mellitus without complications: Secondary | ICD-10-CM | POA: Diagnosis not present

## 2014-11-15 DIAGNOSIS — Z23 Encounter for immunization: Secondary | ICD-10-CM

## 2014-11-15 DIAGNOSIS — E785 Hyperlipidemia, unspecified: Secondary | ICD-10-CM | POA: Diagnosis not present

## 2014-11-15 NOTE — Addendum Note (Signed)
Addended by: Shary Decamp B on: 11/15/2014 10:38 AM   Modules accepted: Orders

## 2014-11-15 NOTE — Progress Notes (Signed)
Subjective:    Patient ID: Harry James, male    DOB: [birth year], 64 y.o.   MRN: [id number]  HPI 02/19/14 Patient is here today for a follow-up. At his last office visit he was found to have a hemoglobin A1c close to 8. His triglycerides were greater than 400 and his LDL was not calculable.  Patient is here today for recheck. He was also found to have polycythemia with a hemoglobin approaching 20. 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 Patient is compliant with his Invokana, and Actos. His hemoglobin A1c has improved to 6.8. His triglycerides have fallen into the 200s. His LDL cholesterol is at goal less than 100. His most recent lab work as listed below. Unfortunately the patient continues to smoke. He is not exercising. His blood pressure is borderline well controlled. He denies any chest pain shortness of breath or dyspnea on exertion.  Patient's Pneumovax is up-to-date. He is due for diabetic foot exam today as well as a diabetic eye exam. At that time, my plan was: Blood pressures well controlled. Diabetes control is acceptable. I did strongly recommend smoking cessation. Also recommended he follow up as planned with oncology. I recommended an annual diabetic eye exam but the patient would like to defer this until after he goes on Medicare this summer. Diabetic foot exam is performed and is normal. Cholesterol LDL is acceptable. Triglycerides are elevated and we discussed a low carb low saturated fat diet to address this. Also recommended gradually increasing aerobic exercise to 30 minutes a day 5 days a week to help address his weight as well as his high triglycerides.  11/15/14 He is here for CPE.  He is due today for a flu shot. He is due for Prevnar 13. He is also due for diabetic eye exam as well as a urine microalbumin. He is due for colonoscopy. He is following up regularly with his hematologist for his polycythemia. His most  recent hemoglobin was 18 and his most recent hematocrit was 54. He denies any complaints. He is trying to watch his diet and exercise. His most recent lab work is listed below: Lab on 11/05/2014  Component Date Value Ref Range Status  . Sodium 11/05/2014 137  135 - 146 mmol/L Final  . Potassium 11/05/2014 4.1  3.5 - 5.3 mmol/L Final  . Chloride 11/05/2014 99  98 - 110 mmol/L Final  . CO2 11/05/2014 27  20 - 31 mmol/L Final  . Glucose, Bld 11/05/2014 104* 70 - 99 mg/dL Final  . BUN 11/05/2014 29* 7 - 25 mg/dL Final  . Creat 11/05/2014 1.10  0.70 - 1.25 mg/dL Final  . Total Bilirubin 11/05/2014 0.7  0.2 - 1.2 mg/dL Final  . Alkaline Phosphatase 11/05/2014 99  40 - 115 U/L Final  . AST 11/05/2014 16  10 - 35 U/L Final  . ALT 11/05/2014 25  9 - 46 U/L Final  . Total Protein 11/05/2014 6.9  6.1 - 8.1 g/dL Final  . Albumin 11/05/2014 4.1  3.6 - 5.1 g/dL Final  . Calcium 11/05/2014 9.4  8.6 - 10.3 mg/dL Final  . GFR, Est African American 11/05/2014 81  >=60 mL/min Final  . GFR, Est Non African American 11/05/2014 70  >=60 mL/min Final   Comment:   The estimated GFR is a calculation valid for adults (>=52 years old) that uses the CKD-EPI algorithm to adjust for age and sex. It is   not  to be used for children, pregnant women, hospitalized patients,    patients on dialysis, or with rapidly changing kidney function. According to the NKDEP, eGFR >89 is normal, 60-89 shows mild impairment, 30-59 shows moderate impairment, 15-29 shows severe impairment and <15 is ESRD.     Marland Kitchen TSH 11/05/2014 0.981  0.350 - 4.500 uIU/mL Final  . Cholesterol 11/05/2014 169  125 - 200 mg/dL Final  . Triglycerides 11/05/2014 311* <150 mg/dL Final  . HDL 11/05/2014 36* >=40 mg/dL Final  . Total CHOL/HDL Ratio 11/05/2014 4.7  <=5.0 Ratio Final  . VLDL 11/05/2014 62* <30 mg/dL Final  . LDL Cholesterol 11/05/2014 71  <130 mg/dL Final   Comment:   Total Cholesterol/HDL Ratio:CHD Risk                        Coronary  Heart Disease Risk Table                                        Men       Women          1/2 Average Risk              3.4        3.3              Average Risk              5.0        4.4           2X Average Risk              9.6        7.1           3X Average Risk             23.4       11.0 Use the calculated Patient Ratio above and the CHD Risk table  to determine the patient's CHD Risk.   Marland Kitchen PSA 11/05/2014 1.45  <=4.00 ng/mL Final   Comment: Test Methodology: ECLIA PSA (Electrochemiluminescence Immunoassay)   For PSA values from 2.5-4.0, particularly in younger men <66 years old, the AUA and NCCN suggest testing for % Free PSA (3515) and evaluation of the rate of increase in PSA (PSA velocity).   . Hgb A1c MFr Bld 11/05/2014 6.6* <5.7 % Final   Comment:                                                                        According to the ADA Clinical Practice Recommendations for 2011, when HbA1c is used as a screening test:     >=6.5%   Diagnostic of Diabetes Mellitus            (if abnormal result is confirmed)   5.7-6.4%   Increased risk of developing Diabetes Mellitus   References:Diagnosis and Classification of Diabetes Mellitus,Diabetes WVPX,1062,69(SWNIO 1):S62-S69 and Standards of Medical Care in         Diabetes - 2011,Diabetes EVOJ,5009,38 (Suppl 1):S11-S61.     . Mean Plasma Glucose 11/05/2014 143* <117 mg/dL Final  Lab on 11/01/2014  Component Date Value Ref Range Status  . WBC 11/01/2014 9.9  4.0 - 10.5 K/uL Final  . RBC 11/01/2014 6.46* 4.22 - 5.81 MIL/uL Final  . Hemoglobin 11/01/2014 18.0* 13.0 - 17.0 g/dL Final  . HCT 11/01/2014 54.0* 39.0 - 52.0 % Final  . MCV 11/01/2014 83.6  78.0 - 100.0 fL Final  . MCH 11/01/2014 27.9  26.0 - 34.0 pg Final  . MCHC 11/01/2014 33.3  30.0 - 36.0 g/dL Final  . RDW 11/01/2014 17.2* 11.5 - 15.5 % Final  . Platelets 11/01/2014 191  150 - 400 K/uL Final  . Neutrophils Relative % 11/01/2014 74   Final  . Neutro Abs  11/01/2014 7.4  1.7 - 7.7 K/uL Final  . Lymphocytes Relative 11/01/2014 17   Final  . Lymphs Abs 11/01/2014 1.7  0.7 - 4.0 K/uL Final  . Monocytes Relative 11/01/2014 6   Final  . Monocytes Absolute 11/01/2014 0.5  0.1 - 1.0 K/uL Final  . Eosinophils Relative 11/01/2014 2   Final  . Eosinophils Absolute 11/01/2014 0.2  0.0 - 0.7 K/uL Final  . Basophils Relative 11/01/2014 1   Final  . Basophils Absolute 11/01/2014 0.1  0.0 - 0.1 K/uL Final  . Ferritin 11/01/2014 15* 24 - 336 ng/mL Final   Performed at [facility]    Past Medical History  Diagnosis Date  . Hypertension   . Hyperlipidemia   . Diabetes mellitus     prediabetes  . Ulcer     perforated duodenal ulcer 12/12  . Tobacco abuse   . Chronic kidney disease     has one functional kidney  . Polycythemia, secondary 01/25/2014   Past Surgical History  Procedure Laterality Date  . Cystectomy  under tongue  . Laparotomy  12/16/2010    [name] patch perf du Procedure: EXPLORATORY LAPAROTOMY;  Surgeon: [name], MD;  Location: [facility];  Service: General;  Laterality: N/A;   Current Outpatient Prescriptions on File Prior to Visit  Medication Sig Dispense Refill  . cloNIDine (CATAPRES) 0.3 MG tablet TAKE 1 TABLET BY MOUTH TWICE DAILY 180 tablet 3  . Fish Oil OIL by Does not apply route. 2 po qhs    . furosemide (LASIX) 80 MG tablet TAKE 1 TABLET BY MOUTH EVERY DAY 30 tablet 11  . INVOKANA 300 MG TABS tablet TAKE 1 TABLET BY MOUTH EVERY DAY BEFORE BREAKFAST 30 tablet 0  . metoprolol (LOPRESSOR) 100 MG tablet TAKE 1 TABLET BY MOUTH TWICE DAILY 60 tablet 5  . Multiple Vitamins-Minerals (MULTIVITAMINS THER. W/MINERALS) TABS Take 1 tablet by mouth daily. 30 each   . pioglitazone (ACTOS) 15 MG tablet TAKE 1 TABLET(15 MG) BY MOUTH DAILY 90 tablet 0  . pioglitazone (ACTOS) 15 MG tablet TAKE 1 TABLET(15 MG) BY MOUTH DAILY 30 tablet 0  . simvastatin (ZOCOR) 40 MG tablet TAKE 1 TABLET BY MOUTH EVERY EVENING 90 tablet 1  .  traMADol (ULTRAM) 50 MG tablet Take 2 tablets (100 mg total) by mouth 4 (four) times daily. (Patient not taking: Reported on 11/15/2014) 60 tablet 0   No current facility-administered medications on file prior to visit.   No Known Allergies Social History   Social History  . Marital Status: Divorced    Spouse Name: N/A  . Number of Children: N/A  . Years of Education: N/A   Occupational History  . Not on file.   Social History Main Topics  . Smoking status: Current Every Day Smoker -- 1.00 packs/day  Types: Cigarettes  . Smokeless tobacco: Never Used  . Alcohol Use: Yes     Comment: daily  . Drug Use: No  . Sexual Activity: Not on file   Other Topics Concern  . Not on file   Social History Narrative     Review of Systems  All other systems reviewed and are negative.      Objective:   Physical Exam  Constitutional: He is oriented to person, place, and time. He appears well-developed and well-nourished. No distress.  HENT:  Head: Normocephalic and atraumatic.  Right Ear: External ear normal.  Left Ear: External ear normal.  Nose: Nose normal.  Mouth/Throat: Oropharynx is clear and moist. No oropharyngeal exudate.  Eyes: Conjunctivae and EOM are normal. Pupils are equal, round, and reactive to light. Right eye exhibits no discharge. No scleral icterus.  Neck: Normal range of motion. Neck supple. No JVD present. No tracheal deviation present. No thyromegaly present.  Cardiovascular: Normal rate, regular rhythm, normal heart sounds and intact distal pulses.  Exam reveals no gallop and no friction rub.   No murmur heard. Pulmonary/Chest: Effort normal and breath sounds normal. No stridor. No respiratory distress. He has no wheezes. He has no rales. He exhibits no tenderness.  Abdominal: Soft. Bowel sounds are normal. He exhibits no distension and no mass. There is no tenderness. There is no rebound and no guarding.  Musculoskeletal: He exhibits no edema.    Lymphadenopathy:    He has no cervical adenopathy.  Neurological: He is alert and oriented to person, place, and time. He has normal reflexes. No cranial nerve deficit. He exhibits normal muscle tone. Coordination normal.  Skin: Skin is warm. No rash noted. He is not diaphoretic. No erythema. No pallor.  Psychiatric: He has a normal mood and affect. His behavior is normal. Judgment and thought content normal.  Vitals reviewed.         Assessment & Plan:  Routine general medical examination at a health care facility  Controlled type 2 diabetes mellitus without complication, without long-term current use of insulin ([place]) - Plan: Ambulatory referral to Ophthalmology, Microalbumin, urine  Essential hypertension  HLD (hyperlipidemia)  Patient refuses a colonoscopy today. He will let me schedule him see an ophthalmologist. I will also check a urine microalbumin. The remainder of his lab work is acceptable. I did ask him to quit smoking, work on his diet, and exercise 30 minutes a day 5 days a week. His blood pressures acceptable. His diabetes is controlled. His LDL cholesterol is below 100. His renal function is stable. He received Prevnar 13 today as well as a flu shot. I will gladly schedule him to see a gastroenterologist for colonoscopy if he will consent to the procedure.  He refuses a rectal exam

## 2014-12-09 ENCOUNTER — Other Ambulatory Visit: Payer: Self-pay | Admitting: Family Medicine

## 2014-12-11 ENCOUNTER — Other Ambulatory Visit: Payer: Self-pay | Admitting: Family Medicine

## 2014-12-30 ENCOUNTER — Encounter (HOSPITAL_BASED_OUTPATIENT_CLINIC_OR_DEPARTMENT_OTHER): Payer: Medicare Other

## 2014-12-30 ENCOUNTER — Encounter (HOSPITAL_COMMUNITY): Payer: Medicare Other | Attending: Hematology & Oncology | Admitting: Hematology & Oncology

## 2014-12-30 ENCOUNTER — Encounter (HOSPITAL_COMMUNITY): Payer: Self-pay | Admitting: Hematology & Oncology

## 2014-12-30 VITALS — BP 151/78 | HR 93 | Temp 97.5°F | Resp 20 | Wt 227.6 lb

## 2014-12-30 DIAGNOSIS — D751 Secondary polycythemia: Secondary | ICD-10-CM | POA: Diagnosis not present

## 2014-12-30 DIAGNOSIS — E611 Iron deficiency: Secondary | ICD-10-CM

## 2014-12-30 DIAGNOSIS — F17200 Nicotine dependence, unspecified, uncomplicated: Secondary | ICD-10-CM | POA: Insufficient documentation

## 2014-12-30 DIAGNOSIS — Z720 Tobacco use: Secondary | ICD-10-CM

## 2014-12-30 DIAGNOSIS — J449 Chronic obstructive pulmonary disease, unspecified: Secondary | ICD-10-CM

## 2014-12-30 DIAGNOSIS — E119 Type 2 diabetes mellitus without complications: Secondary | ICD-10-CM | POA: Diagnosis not present

## 2014-12-30 LAB — CBC WITH DIFFERENTIAL/PLATELET
BASOS PCT: 0 %
Basophils Absolute: 0 10*3/uL (ref 0.0–0.1)
EOS ABS: 0.2 10*3/uL (ref 0.0–0.7)
Eosinophils Relative: 2 %
HEMATOCRIT: 59.4 % — AB (ref 39.0–52.0)
HEMOGLOBIN: 19.8 g/dL — AB (ref 13.0–17.0)
Lymphocytes Relative: 21 %
Lymphs Abs: 2 10*3/uL (ref 0.7–4.0)
MCH: 29.3 pg (ref 26.0–34.0)
MCHC: 33.3 g/dL (ref 30.0–36.0)
MCV: 87.9 fL (ref 78.0–100.0)
MONOS PCT: 5 %
Monocytes Absolute: 0.4 10*3/uL (ref 0.1–1.0)
NEUTROS ABS: 6.8 10*3/uL (ref 1.7–7.7)
NEUTROS PCT: 72 %
Platelets: 172 10*3/uL (ref 150–400)
RBC: 6.76 MIL/uL — AB (ref 4.22–5.81)
RDW: 16.3 % — ABNORMAL HIGH (ref 11.5–15.5)
WBC: 9.4 10*3/uL (ref 4.0–10.5)

## 2014-12-30 LAB — FERRITIN: FERRITIN: 27 ng/mL (ref 24–336)

## 2014-12-30 NOTE — Addendum Note (Signed)
Addended by: Elenor Legato on: 12/30/2014 11:47 AM   Modules accepted: Orders

## 2014-12-30 NOTE — Patient Instructions (Addendum)
[place] at [facility] Discharge Instructions  RECOMMENDATIONS MADE BY THE CONSULTANT AND ANY TEST RESULTS WILL BE SENT TO YOUR REFERRING PHYSICIAN.  Exam completed by Dr Whitney Muse today [place] QUITTING SMOKING!!! CBC every 3 months Return to see the doctor in 6 months Please call the clinic if you have any questions or concerns   Thank you for choosing [place] at [facility] to provide your oncology and hematology care.  To afford each patient quality time with our provider, please arrive at least 15 minutes before your scheduled appointment time.    You need to re-schedule your appointment should you arrive 10 or more minutes late.  We strive to give you quality time with our providers, and arriving late affects you and other patients whose appointments are after yours.  Also, if you no show three or more times for appointments you may be dismissed from the clinic at the providers discretion.     Again, thank you for choosing [facility].  Our hope is that these requests will decrease the amount of time that you wait before being seen by our physicians.       _____________________________________________________________  Should you have questions after your visit to [facility], please contact our office at [phone] between the hours of 8:30 a.m. and 4:30 p.m.  Voicemails left after 4:30 p.m. will not be returned until the following business day.  For prescription refill requests, have your pharmacy contact our office.

## 2014-12-30 NOTE — Progress Notes (Signed)
[name], MD [address] [place] [address]   DIAGNOSIS: Secondary Polycythemia  CURRENT THERAPY: phlebotomy  INTERVAL HISTORY: Harry James 64 y.o. male returns for polycythemia, he is JAK 2 negative. He has had a hemoglobin as high as 22. He reports he feels well. He has smoked since he was a teenager and has averaged a pack a day his entire life.   Harry James is here alone today. He has been managing his diabetes better. He continues to smoke about 3/4 ppd. He has not had a colonoscopy performed. He finds it hard to stay active with how cold it has been. He has had his flu vaccination.  He has no other major concerns or complaints. He notes that is does try to quit smoking but that it is difficult.   MEDICAL HISTORY: Past Medical History  Diagnosis Date  . Hypertension   . Hyperlipidemia   . Diabetes mellitus     prediabetes  . Ulcer     perforated duodenal ulcer 12/12  . Tobacco abuse   . Chronic kidney disease     has one functional kidney  . Polycythemia, secondary 01/25/2014    has Perforated duodenal ulcer ([place]); Fx humeral neck; Obesity (BMI 30-39.9); Renal insufficiency; Hypertension; History of ETOH abuse; Respiratory failure following trauma and surgery ([place]); COPD, severity to be determined ([place]); Dissecting aneurysm of thoracic aorta, Stanford type B ([place]); Tobacco dependence; Hyperlipidemia; Diabetes mellitus; Ulcer; Tobacco abuse; Chronic kidney disease; and Polycythemia, secondary on his problem list.     has No Known Allergies.  Harry James does not currently have medications on file.  SURGICAL HISTORY: Past Surgical History  Procedure Laterality Date  . Cystectomy  under tongue  . Laparotomy  12/16/2010    [name] patch perf du Procedure: EXPLORATORY LAPAROTOMY;  Surgeon: [name], MD;  Location: [facility];  Service: General;  Laterality: N/A;    SOCIAL HISTORY: Social History   Social History  . Marital Status:  Divorced    Spouse Name: N/A  . Number of Children: N/A  . Years of Education: N/A   Occupational History  . Not on file.   Social History Main Topics  . Smoking status: Current Every Day Smoker -- 1.00 packs/day    Types: Cigarettes  . Smokeless tobacco: Never Used  . Alcohol Use: Yes     Comment: daily  . Drug Use: No  . Sexual Activity: Not on file   Other Topics Concern  . Not on file   Social History Narrative    FAMILY HISTORY: History reviewed. No pertinent family history.  Review of Systems  Constitutional: Negative.   HENT: Negative.   Eyes: Negative.   Respiratory: SOB, chronic Cardiovascular: Negative.   Gastrointestinal: Negative.   Genitourinary: Negative.   Musculoskeletal: Negative.  Skin: Negative.   Neurological: Negative.   Endo/Heme/Allergies: Negative.   Psychiatric/Behavioral: Negative.   14 point review of systems was performed and is negative except as detailed under history of present illness and above   PHYSICAL EXAMINATION  ECOG PERFORMANCE STATUS: 1 - Symptomatic but completely ambulatory  Filed Vitals:   12/30/14 1039  BP: 151/78  Pulse: 93  Temp: 97.5 F (36.4 C)  Resp: 20    Physical Exam  Constitutional: He is oriented to person, place, and time and well-developed, well-nourished, and in no distress.  Obese, short neck, obese abdomen. Able to get up and walk to the exam table easily and without assistance.  HENT:  Head: Normocephalic and atraumatic.  Nose: Nose normal.  Mouth/Throat: Oropharynx is clear and moist. No oropharyngeal exudate.  Eyes: Conjunctivae and EOM are normal. Pupils are equal, round, and reactive to light. Right eye exhibits no discharge. Left eye exhibits no discharge. No scleral icterus.  Neck: Normal range of motion. Neck supple. No tracheal deviation present. No thyromegaly present.  Cardiovascular: Normal rate, regular rhythm and normal heart sounds.  Exam reveals no gallop and no friction rub.     No murmur heard. Pulmonary/Chest: Effort normal. He has no wheezes. He has no rales. Rhonchi in the left lower lung cleared with cough. Abdominal: Soft. Bowel sounds are normal. He exhibits no distension and no mass. There is no tenderness. There is no rebound and no guarding.  Musculoskeletal: Normal range of motion. He exhibits no edema.  Lymphadenopathy:    He has no cervical adenopathy.  Neurological: He is alert and oriented to person, place, and time. He has normal reflexes. No cranial nerve deficit. Gait normal. Coordination normal.  Skin: Skin is warm and dry. No rash noted.  Psychiatric: Mood, memory, affect and judgment normal.  Nursing note and vitals reviewed.   LABORATORY DATA: I have reviewed the laboratory data below: LABS PENDING TOTDAY  ASSESSMENT and THERAPY PLAN:   Polycythemia, JAK2 negative, secondary Tobacco Abuse Iron deficiency secondary to phlebotomy  64 year old male with secondary polycythemia. Hemoglobin has been as high as 22. He does not give symptoms consistent with sleep apnea. He is a heavy smoker. A carboxyhemoglobin level was 4.9% in November 2015. JAK 2 V617F testing is negative and reflex testing in EXON 12 and 13 is also negative.  Erythropoietin level was WNL, and not supressed.   I will notify him with lab results when available.   We discussed smoking cessation with Harry James again today. He was again given suggestions to aid in quitting.  We will check on his blood work again in 3 months, and follow-up and see him again in 6 months. Goal is to keep his HCT around 50. We have discussed that the ideal HCT in secondary polycythemia is uncertain.  We discussed smoking cessation, he understands the importance of quitting smoking.  All questions were answered. The patient knows to call the clinic with any problems, questions or concerns. We can certainly see the patient much sooner if necessary.  This note was electronically signed.  This  document serves as a record of services personally performed by Ancil Linsey, MD. It was created on her behalf by Arlyce Harman, a trained medical scribe. The creation of this record is based on the scribe's personal observations and the provider's statements to them. This document has been checked and approved by the attending provider.  I have reviewed the above documentation for accuracy and completeness, and I agree with the above.  Kelby Fam. Whitney Muse, MD

## 2014-12-31 ENCOUNTER — Other Ambulatory Visit (HOSPITAL_COMMUNITY): Payer: Self-pay

## 2014-12-31 NOTE — Progress Notes (Signed)
Labs drawn

## 2015-01-05 ENCOUNTER — Other Ambulatory Visit (HOSPITAL_COMMUNITY): Payer: Self-pay

## 2015-01-05 DIAGNOSIS — D751 Secondary polycythemia: Secondary | ICD-10-CM

## 2015-01-06 ENCOUNTER — Encounter (HOSPITAL_COMMUNITY)
Admission: RE | Admit: 2015-01-06 | Discharge: 2015-01-06 | Disposition: A | Discharge status: Home / Self Care | Payer: Medicare Other | Source: Ambulatory Visit | Attending: Hematology & Oncology | Admitting: Hematology & Oncology

## 2015-01-06 DIAGNOSIS — D751 Secondary polycythemia: Secondary | ICD-10-CM | POA: Diagnosis not present

## 2015-01-06 DIAGNOSIS — Z720 Tobacco use: Secondary | ICD-10-CM | POA: Insufficient documentation

## 2015-01-06 NOTE — Progress Notes (Signed)
Harry James presents today for phlebotomy per MD orders. HGB/HCT: 19.8, 59.4 Phlebotomy procedure started at 1054 and ended at 1101. 19 oz removed. Patient tolerated procedure well. IV needle removed intact.

## 2015-01-11 ENCOUNTER — Other Ambulatory Visit: Payer: Self-pay | Admitting: Family Medicine

## 2015-01-11 NOTE — Telephone Encounter (Signed)
Medication refilled per protocol. 

## 2015-01-13 ENCOUNTER — Encounter (HOSPITAL_COMMUNITY)
Admission: RE | Admit: 2015-01-13 | Discharge: 2015-01-13 | Disposition: A | Discharge status: Home / Self Care | Payer: Medicare Other | Source: Ambulatory Visit | Attending: Hematology & Oncology | Admitting: Hematology & Oncology

## 2015-01-13 DIAGNOSIS — D751 Secondary polycythemia: Secondary | ICD-10-CM | POA: Diagnosis not present

## 2015-01-13 DIAGNOSIS — Z720 Tobacco use: Secondary | ICD-10-CM | POA: Diagnosis not present

## 2015-01-13 NOTE — Progress Notes (Signed)
Harry James presents today for phlebotomy per MD orders.  Phlebotomy procedure started at 1130 and ended at 1142. 20oz removed. Patient tolerated procedure well. IV needle removed intact.

## 2015-02-22 DIAGNOSIS — E119 Type 2 diabetes mellitus without complications: Secondary | ICD-10-CM | POA: Diagnosis not present

## 2015-02-22 LAB — HM DIABETES EYE EXAM

## 2015-03-03 ENCOUNTER — Encounter: Payer: Self-pay | Admitting: Family Medicine

## 2015-03-09 ENCOUNTER — Other Ambulatory Visit: Payer: Self-pay | Admitting: Family Medicine

## 2015-03-11 ENCOUNTER — Other Ambulatory Visit: Payer: Self-pay | Admitting: Family Medicine

## 2015-03-30 ENCOUNTER — Encounter (HOSPITAL_COMMUNITY): Payer: Medicare Other | Attending: Hematology & Oncology

## 2015-03-30 DIAGNOSIS — D751 Secondary polycythemia: Secondary | ICD-10-CM | POA: Diagnosis not present

## 2015-03-30 DIAGNOSIS — F17200 Nicotine dependence, unspecified, uncomplicated: Secondary | ICD-10-CM | POA: Insufficient documentation

## 2015-03-30 DIAGNOSIS — Z720 Tobacco use: Secondary | ICD-10-CM

## 2015-03-30 LAB — CBC WITH DIFFERENTIAL/PLATELET
BASOS PCT: 1 %
Basophils Absolute: 0.1 10*3/uL (ref 0.0–0.1)
EOS PCT: 3 %
Eosinophils Absolute: 0.2 10*3/uL (ref 0.0–0.7)
HEMATOCRIT: 56.7 % — AB (ref 39.0–52.0)
Hemoglobin: 18.8 g/dL — ABNORMAL HIGH (ref 13.0–17.0)
Lymphocytes Relative: 20 %
Lymphs Abs: 1.8 10*3/uL (ref 0.7–4.0)
MCH: 29.3 pg (ref 26.0–34.0)
MCHC: 33.2 g/dL (ref 30.0–36.0)
MCV: 88.5 fL (ref 78.0–100.0)
MONO ABS: 0.5 10*3/uL (ref 0.1–1.0)
MONOS PCT: 6 %
NEUTROS ABS: 6.4 10*3/uL (ref 1.7–7.7)
Neutrophils Relative %: 71 %
PLATELETS: 175 10*3/uL (ref 150–400)
RBC: 6.41 MIL/uL — ABNORMAL HIGH (ref 4.22–5.81)
RDW: 14.4 % (ref 11.5–15.5)
WBC: 9 10*3/uL (ref 4.0–10.5)

## 2015-03-30 LAB — FERRITIN: Ferritin: 15 ng/mL — ABNORMAL LOW (ref 24–336)

## 2015-04-06 ENCOUNTER — Other Ambulatory Visit (HOSPITAL_COMMUNITY): Payer: Self-pay

## 2015-04-08 ENCOUNTER — Encounter: Payer: Self-pay | Admitting: Family Medicine

## 2015-04-11 ENCOUNTER — Other Ambulatory Visit: Payer: Self-pay | Admitting: Family Medicine

## 2015-04-13 ENCOUNTER — Other Ambulatory Visit: Payer: Self-pay | Admitting: Family Medicine

## 2015-04-13 NOTE — Telephone Encounter (Signed)
Refill appropriate and filled per protocol. 

## 2015-04-14 ENCOUNTER — Encounter: Payer: Self-pay | Admitting: Family Medicine

## 2015-04-14 ENCOUNTER — Ambulatory Visit (INDEPENDENT_AMBULATORY_CARE_PROVIDER_SITE_OTHER): Payer: Medicare Other | Admitting: Family Medicine

## 2015-04-14 VITALS — BP 136/84 | HR 78 | Temp 97.3°F | Resp 20 | Ht 66.0 in | Wt 229.0 lb

## 2015-04-14 DIAGNOSIS — L570 Actinic keratosis: Secondary | ICD-10-CM | POA: Diagnosis not present

## 2015-04-14 DIAGNOSIS — D485 Neoplasm of uncertain behavior of skin: Secondary | ICD-10-CM

## 2015-04-14 NOTE — Progress Notes (Signed)
Subjective:    Patient ID: Harry James, male    DOB: Dec 28, 1950, 65 y.o.   MRN: [id number]  HPI Patient has had a lesion on his dorsal left forearm that has been there for at least a year. I have recommended excisional biopsy on several occasions. Patient finally agreed and scheduled an appointment to have an excisional biopsy. There is a 2.5 cm large right hard scaly papule on the dorsum of his left forearm. It appears to be a squamous cell carcinoma versus a keratoacanthoma. He also mentions a lesion on the posterior inferior left calf. This is a 2 cm black hard white scaly papule. He states he does not like how long that has been there. Past Medical History  Diagnosis Date  . Hypertension   . Hyperlipidemia   . Diabetes mellitus     prediabetes  . Ulcer     perforated duodenal ulcer 12/12  . Tobacco abuse   . Chronic kidney disease     has one functional kidney  . Polycythemia, secondary 01/25/2014   Past Surgical History  Procedure Laterality Date  . Cystectomy  under tongue  . Laparotomy  12/16/2010    [name] patch perf du Procedure: EXPLORATORY LAPAROTOMY;  Surgeon: [name], MD;  Location: [facility];  Service: General;  Laterality: N/A;   Current Outpatient Prescriptions on File Prior to Visit  Medication Sig Dispense Refill  . cloNIDine (CATAPRES) 0.3 MG tablet TAKE 1 TABLET BY MOUTH TWICE DAILY 60 tablet 3  . cloNIDine (CATAPRES) 0.3 MG tablet TAKE 1 TABLET BY MOUTH TWICE DAILY 180 tablet 1  . Fish Oil OIL by Does not apply route. 2 po qhs    . furosemide (LASIX) 80 MG tablet TAKE 1 TABLET BY MOUTH EVERY DAY 30 tablet 5  . INVOKANA 300 MG TABS tablet TAKE 1 TABLET BY MOUTH EVERY DAY BEFORE BREAKFAST 30 tablet 3  . metoprolol (LOPRESSOR) 100 MG tablet TAKE 1 TABLET BY MOUTH TWICE DAILY 60 tablet 5  . Multiple Vitamins-Minerals (MULTIVITAMINS THER. W/MINERALS) TABS Take 1 tablet by mouth daily. 30 each   . pioglitazone (ACTOS) 15 MG tablet TAKE 1 TABLET BY MOUTH  DAILY 30 tablet 2  . simvastatin (ZOCOR) 40 MG tablet TAKE 1 TABLET BY MOUTH EVERY EVENING 90 tablet 1  . traMADol (ULTRAM) 50 MG tablet Take 2 tablets (100 mg total) by mouth 4 (four) times daily. (Patient taking differently: Take 100 mg by mouth as needed. ) 60 tablet 0   No current facility-administered medications on file prior to visit.   No Known Allergies Social History   Social History  . Marital Status: Divorced    Spouse Name: N/A  . Number of Children: N/A  . Years of Education: N/A   Occupational History  . Not on file.   Social History Main Topics  . Smoking status: Current Every Day Smoker -- 1.00 packs/day    Types: Cigarettes  . Smokeless tobacco: Never Used  . Alcohol Use: Yes     Comment: daily  . Drug Use: No  . Sexual Activity: Not on file   Other Topics Concern  . Not on file   Social History Narrative      Review of Systems  All other systems reviewed and are negative.      Objective:   Physical Exam  Cardiovascular: Normal rate, regular rhythm and normal heart sounds.   Pulmonary/Chest: Effort normal and breath sounds normal.  Vitals reviewed.  Assessment & Plan:  Neoplasm of uncertain behavior of skin - Plan: Pathology, Pathology  I suspect the lesion on the left forearm is likely a squamous cell carcinoma. The lesion on the left posterior calf could either be a squamous cell carcinoma versus an abnormal seborrheic keratosis.  Using sterile technique, both lesions were anesthetized 0.1% lidocaine with epinephrine. They were then prepped and draped in sterile fashion. First, I excised the lesion on the dorsum of his left forearm. I performed a 3 cm x 2 cm elliptical excision down to the subcutaneous fat around the lesion. The skin edges were then approximated with 3 simple interrupted 4-0 Ethilon sutures. Second I turned my attention to the lesion on the posterior left calf. It was prepped and draped in sterile fashion. I performed a  3 cm x 2 cm elliptical excision down to the subcutaneous fat around that lesion as well. Skin edges were then approximated using 3 simple interrupted 4-0 Ethilon sutures. Patient is to return in one week for suture removal. Lesions were sent to pathology and labeled container.

## 2015-04-18 LAB — PATHOLOGY

## 2015-04-20 ENCOUNTER — Encounter (HOSPITAL_COMMUNITY)
Admission: RE | Admit: 2015-04-20 | Discharge: 2015-04-20 | Disposition: A | Discharge status: Home / Self Care | Payer: Medicare Other | Source: Ambulatory Visit | Attending: Hematology & Oncology | Admitting: Hematology & Oncology

## 2015-04-20 ENCOUNTER — Encounter (HOSPITAL_COMMUNITY): Payer: Self-pay

## 2015-04-20 DIAGNOSIS — D751 Secondary polycythemia: Secondary | ICD-10-CM | POA: Insufficient documentation

## 2015-04-20 NOTE — Progress Notes (Signed)
Harry James presents today for phlebotomy per MD orders.  Phlebotomy procedure started at 1021 and ended at 1031. 24 ounces removed. Patient tolerated procedure well. IV needle removed intact.

## 2015-04-21 ENCOUNTER — Ambulatory Visit (INDEPENDENT_AMBULATORY_CARE_PROVIDER_SITE_OTHER): Payer: Medicare Other | Admitting: Family Medicine

## 2015-04-21 ENCOUNTER — Encounter: Payer: Self-pay | Admitting: Family Medicine

## 2015-04-21 VITALS — Temp 97.6°F | Ht 66.0 in | Wt 228.0 lb

## 2015-04-21 DIAGNOSIS — Z4802 Encounter for removal of sutures: Secondary | ICD-10-CM

## 2015-04-21 DIAGNOSIS — E118 Type 2 diabetes mellitus with unspecified complications: Secondary | ICD-10-CM

## 2015-04-21 LAB — CBC WITH DIFFERENTIAL/PLATELET
Basophils Absolute: 85 cells/uL (ref 0–200)
Basophils Relative: 1 %
Eosinophils Absolute: 255 cells/uL (ref 15–500)
Eosinophils Relative: 3 %
HEMATOCRIT: 53.6 % — AB (ref 38.5–50.0)
Hemoglobin: 17.7 g/dL — ABNORMAL HIGH (ref 13.0–17.0)
LYMPHS PCT: 21 %
Lymphs Abs: 1785 cells/uL (ref 850–3900)
MCH: 29 pg (ref 27.0–33.0)
MCHC: 33 g/dL (ref 32.0–36.0)
MCV: 87.7 fL (ref 80.0–100.0)
MONO ABS: 425 {cells}/uL (ref 200–950)
MONOS PCT: 5 %
MPV: 9.4 fL (ref 7.5–12.5)
NEUTROS PCT: 70 %
Neutro Abs: 5950 cells/uL (ref 1500–7800)
PLATELETS: 192 10*3/uL (ref 140–400)
RBC: 6.11 MIL/uL — AB (ref 4.20–5.80)
RDW: 14.7 % (ref 11.0–15.0)
WBC: 8.5 10*3/uL (ref 3.8–10.8)

## 2015-04-21 LAB — COMPLETE METABOLIC PANEL WITH GFR
ALBUMIN: 4.3 g/dL (ref 3.6–5.1)
ALK PHOS: 119 U/L — AB (ref 40–115)
ALT: 20 U/L (ref 9–46)
AST: 17 U/L (ref 10–35)
BILIRUBIN TOTAL: 0.7 mg/dL (ref 0.2–1.2)
BUN: 26 mg/dL — AB (ref 7–25)
CALCIUM: 9.2 mg/dL (ref 8.6–10.3)
CHLORIDE: 98 mmol/L (ref 98–110)
CO2: 29 mmol/L (ref 20–31)
CREATININE: 1.25 mg/dL (ref 0.70–1.25)
GFR, EST AFRICAN AMERICAN: 69 mL/min (ref >=60)
GFR, Est Non African American: 60 mL/min (ref >=60)
Glucose, Bld: 106 mg/dL — ABNORMAL HIGH (ref 70–99)
Potassium: 4.5 mmol/L (ref 3.5–5.3)
Sodium: 137 mmol/L (ref 135–146)
TOTAL PROTEIN: 6.7 g/dL (ref 6.1–8.1)

## 2015-04-21 LAB — LIPID PANEL
Cholesterol: 156 mg/dL (ref 125–200)
HDL: 41 mg/dL (ref >=40)
LDL CALC: 58 mg/dL (ref <130)
TRIGLYCERIDES: 285 mg/dL — AB (ref <150)
Total CHOL/HDL Ratio: 3.8 Ratio (ref <=5.0)
VLDL: 57 mg/dL — ABNORMAL HIGH (ref <30)

## 2015-04-21 NOTE — Progress Notes (Signed)
Subjective:    Patient ID: Harry James, male    DOB: 04-Nov-1950, 65 y.o.   MRN: [id number]  HPI 04/14/15 Patient has had a lesion on his dorsal left forearm that has been there for at least a year. I have recommended excisional biopsy on several occasions. Patient finally agreed and scheduled an appointment to have an excisional biopsy. There is a 2.5 cm large right hard scaly papule on the dorsum of his left forearm. It appears to be a squamous cell carcinoma versus a keratoacanthoma. He also mentions a lesion on the posterior inferior left calf. This is a 2 cm black hard white scaly papule. He states he does not like how long that has been there.  At that time, my plan was: I  suspect the lesion on the left forearm is likely a squamous cell carcinoma. The lesion on the left posterior calf could either be a squamous cell carcinoma versus an abnormal seborrheic keratosis.  Using sterile technique, both lesions were anesthetized 0.1% lidocaine with epinephrine. They were then prepped and draped in sterile fashion. First, I excised the lesion on the dorsum of his left forearm. I performed a 3 cm x 2 cm elliptical excision down to the subcutaneous fat around the lesion. The skin edges were then approximated with 3 simple interrupted 4-0 Ethilon sutures. Second I turned my attention to the lesion on the posterior left calf. It was prepped and draped in sterile fashion. I performed a 3 cm x 2 cm elliptical excision down to the subcutaneous fat around that lesion as well. Skin edges were then approximated using 3 simple interrupted 4-0 Ethilon sutures. Patient is to return in one week for suture removal. Lesions were sent to pathology and labeled container.  04/21/15 The patient is here today for suture removal. Biopsy confirmed squamous cell carcinoma on the left forearm. Fortunately margins were clear. The lesion on his posterior left calf is benign. Both surgical sites appear healed. They're ready for  suture removal. 3 sutures were removed from the left arm. 3 sutures were removed from the left leg without difficulty. The wound was then covered with Steri-Strips and the left forearm and a Band-Aid and just a Band-Aid on the left leg Past Medical History  Diagnosis Date  . Hypertension   . Hyperlipidemia   . Diabetes mellitus     prediabetes  . Ulcer     perforated duodenal ulcer 12/12  . Tobacco abuse   . Chronic kidney disease     has one functional kidney  . Polycythemia, secondary 01/25/2014   Past Surgical History  Procedure Laterality Date  . Cystectomy  under tongue  . Laparotomy  12/16/2010    [name] patch perf du Procedure: EXPLORATORY LAPAROTOMY;  Surgeon: [name], MD;  Location: [facility];  Service: General;  Laterality: N/A;   Current Outpatient Prescriptions on File Prior to Visit  Medication Sig Dispense Refill  . cloNIDine (CATAPRES) 0.3 MG tablet TAKE 1 TABLET BY MOUTH TWICE DAILY 60 tablet 3  . cloNIDine (CATAPRES) 0.3 MG tablet TAKE 1 TABLET BY MOUTH TWICE DAILY 180 tablet 1  . Fish Oil OIL by Does not apply route. 2 po qhs    . furosemide (LASIX) 80 MG tablet TAKE 1 TABLET BY MOUTH EVERY DAY 30 tablet 5  . INVOKANA 300 MG TABS tablet TAKE 1 TABLET BY MOUTH EVERY DAY BEFORE BREAKFAST 30 tablet 3  . metoprolol (LOPRESSOR) 100 MG tablet TAKE 1 TABLET BY MOUTH TWICE DAILY  60 tablet 5  . Multiple Vitamins-Minerals (MULTIVITAMINS THER. W/MINERALS) TABS Take 1 tablet by mouth daily. 30 each   . pioglitazone (ACTOS) 15 MG tablet TAKE 1 TABLET BY MOUTH DAILY 30 tablet 2  . simvastatin (ZOCOR) 40 MG tablet TAKE 1 TABLET BY MOUTH EVERY EVENING 90 tablet 1  . traMADol (ULTRAM) 50 MG tablet Take 2 tablets (100 mg total) by mouth 4 (four) times daily. (Patient taking differently: Take 100 mg by mouth as needed. ) 60 tablet 0   No current facility-administered medications on file prior to visit.   No Known Allergies Social History   Social History  . Marital Status:  Divorced    Spouse Name: N/A  . Number of Children: N/A  . Years of Education: N/A   Occupational History  . Not on file.   Social History Main Topics  . Smoking status: Current Every Day Smoker -- 1.00 packs/day    Types: Cigarettes  . Smokeless tobacco: Never Used  . Alcohol Use: Yes     Comment: daily  . Drug Use: No  . Sexual Activity: Not on file   Other Topics Concern  . Not on file   Social History Narrative      Review of Systems  All other systems reviewed and are negative.      Objective:   Physical Exam  Cardiovascular: Normal rate, regular rhythm and normal heart sounds.   Pulmonary/Chest: Effort normal and breath sounds normal.  Vitals reviewed.         Assessment & Plan:  Controlled type 2 diabetes mellitus with complication, without long-term current use of insulin ([place]) - Plan: COMPLETE METABOLIC PANEL WITH GFR, CBC with Differential/Platelet, Lipid panel, Hemoglobin A1c  Visit for suture removal As stated in the history of present illness, sutures were removed without difficulty and the wounds were covered with Band-Aids. The patient is here today fasting and would like to check fasting lab work to monitor his diabetes. Therefore I will check a CMP, CBC, fasting lipid panel, and hemoglobin A1c along with urine microalbumin.

## 2015-04-22 LAB — HEMOGLOBIN A1C
HEMOGLOBIN A1C: 6.6 % — AB (ref <5.7)
Mean Plasma Glucose: 143 mg/dL

## 2015-04-27 ENCOUNTER — Encounter (HOSPITAL_COMMUNITY)
Admission: RE | Admit: 2015-04-27 | Discharge: 2015-04-27 | Disposition: A | Discharge status: Home / Self Care | Payer: Medicare Other | Source: Ambulatory Visit | Attending: Hematology & Oncology | Admitting: Hematology & Oncology

## 2015-04-27 DIAGNOSIS — D751 Secondary polycythemia: Secondary | ICD-10-CM

## 2015-04-27 NOTE — Progress Notes (Signed)
Harry James presents today for phlebotomy per MD orders.  Phlebotomy procedure started at 1024 and ended at 1030. 5.6oz removed. Blood stopped.  Pt. Refused to be stuck again. Patient tolerated procedure well. IV needle removed intact.

## 2015-05-04 ENCOUNTER — Other Ambulatory Visit: Payer: Self-pay | Admitting: Family Medicine

## 2015-05-08 ENCOUNTER — Other Ambulatory Visit: Payer: Self-pay | Admitting: Family Medicine

## 2015-06-30 ENCOUNTER — Ambulatory Visit (HOSPITAL_COMMUNITY): Payer: Medicare Other | Admitting: Hematology & Oncology

## 2015-06-30 ENCOUNTER — Encounter (HOSPITAL_COMMUNITY): Payer: Medicare Other | Attending: Hematology & Oncology

## 2015-06-30 DIAGNOSIS — D751 Secondary polycythemia: Secondary | ICD-10-CM | POA: Diagnosis not present

## 2015-06-30 DIAGNOSIS — Z720 Tobacco use: Secondary | ICD-10-CM

## 2015-06-30 LAB — CBC WITH DIFFERENTIAL/PLATELET
BASOS ABS: 0.1 10*3/uL (ref 0.0–0.1)
BASOS PCT: 1 %
EOS ABS: 0.2 10*3/uL (ref 0.0–0.7)
EOS PCT: 2 %
HCT: 56.8 % — ABNORMAL HIGH (ref 39.0–52.0)
Hemoglobin: 18.8 g/dL — ABNORMAL HIGH (ref 13.0–17.0)
Lymphocytes Relative: 17 %
Lymphs Abs: 1.7 10*3/uL (ref 0.7–4.0)
MCH: 29.2 pg (ref 26.0–34.0)
MCHC: 33.1 g/dL (ref 30.0–36.0)
MCV: 88.3 fL (ref 78.0–100.0)
MONO ABS: 0.4 10*3/uL (ref 0.1–1.0)
Monocytes Relative: 5 %
Neutro Abs: 7.3 10*3/uL (ref 1.7–7.7)
Neutrophils Relative %: 76 %
PLATELETS: 182 10*3/uL (ref 150–400)
RBC: 6.43 MIL/uL — AB (ref 4.22–5.81)
RDW: 15.2 % (ref 11.5–15.5)
WBC: 9.7 10*3/uL (ref 4.0–10.5)

## 2015-06-30 LAB — FERRITIN: FERRITIN: 21 ng/mL — AB (ref 24–336)

## 2015-06-30 NOTE — Progress Notes (Signed)
This encounter was created in error - please disregard.

## 2015-07-06 ENCOUNTER — Encounter (HOSPITAL_COMMUNITY)
Admission: RE | Admit: 2015-07-06 | Discharge: 2015-07-06 | Disposition: A | Discharge status: Home / Self Care | Payer: Medicare Other | Source: Ambulatory Visit | Attending: Hematology & Oncology | Admitting: Hematology & Oncology

## 2015-07-06 DIAGNOSIS — D751 Secondary polycythemia: Secondary | ICD-10-CM | POA: Diagnosis not present

## 2015-07-06 NOTE — Progress Notes (Signed)
Harry James presents today for phlebotomy per MD orders. HGB/HCT:18.8/56.8 Phlebotomy procedure started at 1112 and ended at 1124. 720 cc removed. Patient tolerated procedure well. IV needle removed intact.

## 2015-07-09 ENCOUNTER — Other Ambulatory Visit: Payer: Self-pay | Admitting: Family Medicine

## 2015-08-03 ENCOUNTER — Encounter (HOSPITAL_COMMUNITY): Payer: Medicare Other

## 2015-08-03 ENCOUNTER — Other Ambulatory Visit (HOSPITAL_COMMUNITY): Payer: Medicare Other

## 2015-08-03 ENCOUNTER — Encounter (HOSPITAL_COMMUNITY): Payer: Medicare Other | Attending: Hematology & Oncology | Admitting: Hematology & Oncology

## 2015-08-03 ENCOUNTER — Encounter (HOSPITAL_COMMUNITY): Payer: Self-pay | Admitting: Hematology & Oncology

## 2015-08-03 VITALS — BP 114/53 | HR 73 | Temp 97.9°F | Resp 16 | Wt 235.4 lb

## 2015-08-03 DIAGNOSIS — Z720 Tobacco use: Secondary | ICD-10-CM | POA: Diagnosis not present

## 2015-08-03 DIAGNOSIS — J449 Chronic obstructive pulmonary disease, unspecified: Secondary | ICD-10-CM

## 2015-08-03 DIAGNOSIS — D509 Iron deficiency anemia, unspecified: Secondary | ICD-10-CM

## 2015-08-03 DIAGNOSIS — D751 Secondary polycythemia: Secondary | ICD-10-CM | POA: Diagnosis not present

## 2015-08-03 LAB — CBC WITH DIFFERENTIAL/PLATELET
BASOS ABS: 0.1 10*3/uL (ref 0.0–0.1)
BASOS PCT: 1 %
EOS ABS: 0.2 10*3/uL (ref 0.0–0.7)
Eosinophils Relative: 2 %
HEMATOCRIT: 53 % — AB (ref 39.0–52.0)
Hemoglobin: 17.5 g/dL — ABNORMAL HIGH (ref 13.0–17.0)
Lymphocytes Relative: 17 %
Lymphs Abs: 1.8 10*3/uL (ref 0.7–4.0)
MCH: 29.3 pg (ref 26.0–34.0)
MCHC: 33 g/dL (ref 30.0–36.0)
MCV: 88.6 fL (ref 78.0–100.0)
MONO ABS: 0.5 10*3/uL (ref 0.1–1.0)
MONOS PCT: 5 %
NEUTROS ABS: 7.7 10*3/uL (ref 1.7–7.7)
NEUTROS PCT: 75 %
Platelets: 170 10*3/uL (ref 150–400)
RBC: 5.98 MIL/uL — ABNORMAL HIGH (ref 4.22–5.81)
RDW: 14.7 % (ref 11.5–15.5)
WBC: 10.2 10*3/uL (ref 4.0–10.5)

## 2015-08-03 NOTE — Patient Instructions (Signed)
[place] at [facility] Discharge Instructions  RECOMMENDATIONS MADE BY THE CONSULTANT AND ANY TEST RESULTS WILL BE SENT TO YOUR REFERRING PHYSICIAN.  Exam done and seen today by Dr. Gustavus Bryant today, 3 months, 6 months Return to see the doctor in 6 months Please call the clinic if you have any questions or concerns  Thank you for choosing [place] at [facility] to provide your oncology and hematology care.  To afford each patient quality time with our provider, please arrive at least 15 minutes before your scheduled appointment time.   Beginning January 23rd 2017 lab work for the [facility] will be done in the  Main lab at [facility] on 1st floor. If you have a lab appointment with the [facility] please come in thru the  Main Entrance and check in at the main information desk  You need to re-schedule your appointment should you arrive 10 or more minutes late.  We strive to give you quality time with our providers, and arriving late affects you and other patients whose appointments are after yours.  Also, if you no show three or more times for appointments you may be dismissed from the clinic at the providers discretion.     Again, thank you for choosing [facility].  Our hope is that these requests will decrease the amount of time that you wait before being seen by our physicians.       _____________________________________________________________  Should you have questions after your visit to [facility], please contact our office at [phone] between the hours of 8:30 a.m. and 4:30 p.m.  Voicemails left after 4:30 p.m. will not be returned until the following business day.  For prescription refill requests, have your pharmacy contact our office.         Resources For Cancer Patients and their Caregivers ? American Cancer Society: Can assist with transportation, wigs, general needs, runs Look  Good Feel Better.        (517)635-7663 ? Cancer Care: Provides financial assistance, online support groups, medication/co-pay assistance.  1-800-813-HOPE 9594360579) ? [facility] Assists [place] cancer patients and their families through emotional , educational and financial support.  321-069-0015 ? [place] DSS Where to apply for food stamps, Medicaid and utility assistance. 534-266-5060 ? [facility]: Transportation to medical appointments. 734-720-2799 ? Social Security Administration: May apply for disability if have a Stage IV cancer. 316-257-3453 323 159 5247 ? [employer], Disability and Transit Services: Assists with nutrition, care and transit needs. [place] Support Programs: @10RELATIVEDAYS @ > Cancer Support Group  2nd Tuesday of the month 1pm-2pm, Journey Room  > Creative Journey  3rd Tuesday of the month 1130am-1pm, Journey Room  > Look Good Feel Better  1st Wednesday of the month 10am-12 noon, Journey Room (Call [facility] to register [phone])

## 2015-08-03 NOTE — Progress Notes (Signed)
Harry Fraction, MD [address] [place] [address]   DIAGNOSIS: Secondary Polycythemia  CURRENT THERAPY: phlebotomy  INTERVAL HISTORY: Harry James 65 y.o. male returns for polycythemia, he is JAK 2 negative. He has had a hemoglobin as high as 22. He reports he feels well. He has smoked since he was a teenager and has averaged a pack a day his entire life.   Harry James is unaccompanied.  The patient did not have labs drawn before coming into the clinic today.  He continues to smoke. He notes it is too hard to quit. He tries to cut back but then picks it right back up.  He does yard work. He does not have any major plans for the summer, other than potentially visiting his brother in [place].   He denies headaches and blurry vision. He denies abdominal pain or bowel issues. He denies leg swelling. His only complaint is "getting old".    MEDICAL HISTORY: Past Medical History  Diagnosis Date  . Hypertension   . Hyperlipidemia   . Diabetes mellitus     prediabetes  . Ulcer     perforated duodenal ulcer 12/12  . Tobacco abuse   . Chronic kidney disease     has one functional kidney  . Polycythemia, secondary 01/25/2014    has Perforated duodenal ulcer ([facility]); Fx humeral neck; Obesity (BMI 30-39.9); Renal insufficiency; Hypertension; History of ETOH abuse; Respiratory failure following trauma and surgery ([facility]); COPD, severity to be determined ([place]); Dissecting aneurysm of thoracic aorta, Stanford type B ([place]); Tobacco dependence; Hyperlipidemia; Diabetes mellitus; Ulcer; Tobacco abuse; Chronic kidney disease; and Polycythemia, secondary on his problem list.     has No Known Allergies.  Harry James does not currently have medications on file.  SURGICAL HISTORY: Past Surgical History  Procedure Laterality Date  . Cystectomy  under tongue  . Laparotomy  12/16/2010    [name] patch perf du Procedure: EXPLORATORY LAPAROTOMY;  Surgeon: [name], MD;   Location: [facility];  Service: General;  Laterality: N/A;    SOCIAL HISTORY: Social History   Social History  . Marital Status: Divorced    Spouse Name: N/A  . Number of Children: N/A  . Years of Education: N/A   Occupational History  . Not on file.   Social History Main Topics  . Smoking status: Current Every Day Smoker -- 1.00 packs/day    Types: Cigarettes  . Smokeless tobacco: Never Used  . Alcohol Use: Yes     Comment: daily  . Drug Use: No  . Sexual Activity: Not on file   Other Topics Concern  . Not on file   Social History Narrative    FAMILY HISTORY: History reviewed. No pertinent family history.  Review of Systems  Constitutional: Negative.   HENT: Negative.   Eyes: Negative.   Respiratory: SOB, chronic Cardiovascular: Negative.   Gastrointestinal: Negative.   Genitourinary: Negative.   Musculoskeletal: Negative.  Skin: Negative.   Neurological: Negative.   Endo/Heme/Allergies: Negative.   Psychiatric/Behavioral: Negative.   14 point review of systems was performed and is negative except as detailed under history of present illness and above   PHYSICAL EXAMINATION  ECOG PERFORMANCE STATUS: 1 - Symptomatic but completely ambulatory  Filed Vitals:   08/03/15 1212  BP: 114/53  Pulse: 73  Temp: 97.9 F (36.6 C)  Resp: 16    Physical Exam  Constitutional: He is oriented to person, place, and time and well-developed, well-nourished, and  in no distress.  Obese, short neck, obese abdomen. Able to get up and walk to the exam table easily and without assistance. HENT:  Head: Normocephalic and atraumatic.  Nose: Nose normal.  Mouth/Throat: Oropharynx is clear and moist. No oropharyngeal exudate.  Eyes: Conjunctivae and EOM are normal. Pupils are equal, round, and reactive to light. Right eye exhibits no discharge. Left eye exhibits no discharge. No scleral icterus.  Neck: Normal range of motion. Neck supple. No tracheal deviation present. No  thyromegaly present.  Cardiovascular: Normal rate, regular rhythm and normal heart sounds.  Exam reveals no gallop and no friction rub.   No murmur heard. Pulmonary/Chest: Effort normal. He has no wheezes. He has no rales. Occasional rhonchi Abdominal: Soft. Bowel sounds are normal. He exhibits no distension and no mass. There is no tenderness. There is no rebound and no guarding.  Musculoskeletal: Normal range of motion. He exhibits no edema.  Lymphadenopathy:    He has no cervical adenopathy.  Neurological: He is alert and oriented to person, place, and time. He has normal reflexes. No cranial nerve deficit. Gait normal. Coordination normal.  Skin: Skin is warm and dry. No rash noted.  Psychiatric: Mood, memory, affect and judgment normal.  Nursing note and vitals reviewed.   LABORATORY DATA: I have reviewed the laboratory data below: Results for Harry James (MRN [id number])   Ref. Range 08/03/2015 12:54  WBC Latest Ref Range: 4.0 - 10.5 K/uL 10.2  RBC Latest Ref Range: 4.22 - 5.81 MIL/uL 5.98 (H)  Hemoglobin Latest Ref Range: 13.0 - 17.0 g/dL 17.5 (H)  HCT Latest Ref Range: 39.0 - 52.0 % 53.0 (H)  MCV Latest Ref Range: 78.0 - 100.0 fL 88.6  MCH Latest Ref Range: 26.0 - 34.0 pg 29.3  MCHC Latest Ref Range: 30.0 - 36.0 g/dL 33.0  RDW Latest Ref Range: 11.5 - 15.5 % 14.7  Platelets Latest Ref Range: 150 - 400 K/uL 170  Neutrophils Latest Units: % 75  Lymphocytes Latest Units: % 17  Monocytes Relative Latest Units: % 5  Eosinophil Latest Units: % 2  Basophil Latest Units: % 1  NEUT# Latest Ref Range: 1.7 - 7.7 K/uL 7.7  Lymphocyte # Latest Ref Range: 0.7 - 4.0 K/uL 1.8  Monocyte # Latest Ref Range: 0.1 - 1.0 K/uL 0.5  Eosinophils Absolute Latest Ref Range: 0.0 - 0.7 K/uL 0.2  Basophils Absolute Latest Ref Range: 0.0 - 0.1 K/uL 0.1    ASSESSMENT and THERAPY PLAN:   Polycythemia, JAK2 negative, secondary Tobacco Abuse Iron deficiency secondary to phlebotomy  65 year old  male with secondary polycythemia. Hemoglobin has been as high as 22. He does not give symptoms consistent with sleep apnea although he has the body habitus that would be c/w sleep apnea. He is a heavy smoker. A carboxyhemoglobin level was 4.9% in November 2015. JAK 2 V617F testing is negative and reflex testing in EXON 12 and 13 is also negative.  Erythropoietin level was WNL, and not supressed.   Goal is to keep his HCT around 55%.  I spoke with the patient about smoking cessation. We will continue to address this at each visit.   He will return for blood work in 3 months and follow up in 6 months.   All questions were answered. The patient knows to call the clinic with any problems, questions or concerns. We can certainly see the patient much sooner if necessary.  This note was electronically signed.  This document serves as a record of services  personally performed by Ancil Linsey, MD. It was created on her behalf by Arlyce Harman, a trained medical scribe. The creation of this record is based on the scribe's personal observations and the provider's statements to them. This document has been checked and approved by the attending provider.  I have reviewed the above documentation for accuracy and completeness, and I agree with the above.  [facility] Fam. Whitney Muse, MD

## 2015-08-12 ENCOUNTER — Other Ambulatory Visit: Payer: Self-pay | Admitting: Family Medicine

## 2015-08-28 ENCOUNTER — Encounter (HOSPITAL_COMMUNITY): Payer: Self-pay | Admitting: Hematology & Oncology

## 2015-09-02 ENCOUNTER — Other Ambulatory Visit: Payer: Self-pay | Admitting: Family Medicine

## 2015-09-02 NOTE — Telephone Encounter (Signed)
Refill appropriate and filled per protocol. 

## 2015-09-05 ENCOUNTER — Other Ambulatory Visit: Payer: Self-pay | Admitting: Family Medicine

## 2015-09-05 NOTE — Telephone Encounter (Signed)
Refill appropriate and filled per protocol. 

## 2015-10-01 ENCOUNTER — Other Ambulatory Visit: Payer: Self-pay | Admitting: Family Medicine

## 2015-10-04 ENCOUNTER — Other Ambulatory Visit: Payer: Self-pay | Admitting: Family Medicine

## 2015-11-03 ENCOUNTER — Encounter (HOSPITAL_COMMUNITY): Payer: Medicare Other | Attending: Hematology & Oncology

## 2015-11-03 DIAGNOSIS — Z720 Tobacco use: Secondary | ICD-10-CM | POA: Diagnosis not present

## 2015-11-03 DIAGNOSIS — D751 Secondary polycythemia: Secondary | ICD-10-CM | POA: Insufficient documentation

## 2015-11-03 LAB — CBC WITH DIFFERENTIAL/PLATELET
BASOS ABS: 0.1 10*3/uL (ref 0.0–0.1)
BASOS PCT: 1 %
EOS PCT: 2 %
Eosinophils Absolute: 0.2 10*3/uL (ref 0.0–0.7)
HCT: 58.2 % — ABNORMAL HIGH (ref 39.0–52.0)
Hemoglobin: 19.7 g/dL — ABNORMAL HIGH (ref 13.0–17.0)
Lymphocytes Relative: 18 %
Lymphs Abs: 1.8 10*3/uL (ref 0.7–4.0)
MCH: 30.3 pg (ref 26.0–34.0)
MCHC: 33.8 g/dL (ref 30.0–36.0)
MCV: 89.4 fL (ref 78.0–100.0)
MONO ABS: 0.5 10*3/uL (ref 0.1–1.0)
Monocytes Relative: 5 %
Neutro Abs: 7.1 10*3/uL (ref 1.7–7.7)
Neutrophils Relative %: 74 %
PLATELETS: 177 10*3/uL (ref 150–400)
RBC: 6.51 MIL/uL — ABNORMAL HIGH (ref 4.22–5.81)
RDW: 14.7 % (ref 11.5–15.5)
WBC: 9.7 10*3/uL (ref 4.0–10.5)

## 2015-11-03 LAB — FERRITIN: Ferritin: 33 ng/mL (ref 24–336)

## 2015-11-04 ENCOUNTER — Other Ambulatory Visit (HOSPITAL_COMMUNITY): Payer: Self-pay | Admitting: *Deleted

## 2015-11-18 ENCOUNTER — Encounter (HOSPITAL_COMMUNITY)
Admission: RE | Admit: 2015-11-18 | Discharge: 2015-11-18 | Disposition: A | Discharge status: Home / Self Care | Payer: Medicare Other | Source: Ambulatory Visit | Attending: Hematology & Oncology | Admitting: Hematology & Oncology

## 2015-11-18 DIAGNOSIS — D751 Secondary polycythemia: Secondary | ICD-10-CM | POA: Diagnosis not present

## 2015-11-18 NOTE — Progress Notes (Signed)
Harry James presents today for phlebotomy per MD orders. HGB/HCT:19.7/58.2 Phlebotomy procedure started at 1125 and ended at 1134. 22 oz removed. Patient tolerated procedure well. IV needle removed intact. drsg to site. Refused po fluids.

## 2015-12-03 ENCOUNTER — Other Ambulatory Visit: Payer: Self-pay | Admitting: Family Medicine

## 2015-12-06 ENCOUNTER — Other Ambulatory Visit: Payer: Self-pay | Admitting: Family Medicine

## 2015-12-07 ENCOUNTER — Ambulatory Visit (INDEPENDENT_AMBULATORY_CARE_PROVIDER_SITE_OTHER): Payer: Medicare Other

## 2015-12-07 DIAGNOSIS — Z23 Encounter for immunization: Secondary | ICD-10-CM

## 2015-12-30 ENCOUNTER — Other Ambulatory Visit: Payer: Self-pay | Admitting: Family Medicine

## 2016-01-03 ENCOUNTER — Other Ambulatory Visit: Payer: Medicare Other

## 2016-01-03 ENCOUNTER — Other Ambulatory Visit: Payer: Self-pay | Admitting: Family Medicine

## 2016-01-03 DIAGNOSIS — N189 Chronic kidney disease, unspecified: Secondary | ICD-10-CM

## 2016-01-03 DIAGNOSIS — I10 Essential (primary) hypertension: Secondary | ICD-10-CM | POA: Diagnosis not present

## 2016-01-03 DIAGNOSIS — Z0000 Encounter for general adult medical examination without abnormal findings: Secondary | ICD-10-CM

## 2016-01-03 DIAGNOSIS — E1122 Type 2 diabetes mellitus with diabetic chronic kidney disease: Secondary | ICD-10-CM | POA: Diagnosis not present

## 2016-01-03 DIAGNOSIS — E1165 Type 2 diabetes mellitus with hyperglycemia: Principal | ICD-10-CM

## 2016-01-03 DIAGNOSIS — Z125 Encounter for screening for malignant neoplasm of prostate: Secondary | ICD-10-CM

## 2016-01-03 DIAGNOSIS — J449 Chronic obstructive pulmonary disease, unspecified: Secondary | ICD-10-CM | POA: Diagnosis not present

## 2016-01-03 DIAGNOSIS — E785 Hyperlipidemia, unspecified: Secondary | ICD-10-CM

## 2016-01-03 DIAGNOSIS — Z79899 Other long term (current) drug therapy: Secondary | ICD-10-CM

## 2016-01-03 LAB — PSA: PSA: 1.4 ng/mL (ref <=4.0)

## 2016-01-03 LAB — CBC WITH DIFFERENTIAL/PLATELET
BASOS ABS: 85 {cells}/uL (ref 0–200)
BASOS PCT: 1 %
EOS ABS: 255 {cells}/uL (ref 15–500)
Eosinophils Relative: 3 %
HCT: 55.2 % — ABNORMAL HIGH (ref 38.5–50.0)
Hemoglobin: 18.3 g/dL — ABNORMAL HIGH (ref 13.0–17.0)
Lymphocytes Relative: 21 %
Lymphs Abs: 1785 cells/uL (ref 850–3900)
MCH: 29.3 pg (ref 27.0–33.0)
MCHC: 33.2 g/dL (ref 32.0–36.0)
MCV: 88.3 fL (ref 80.0–100.0)
MONO ABS: 425 {cells}/uL (ref 200–950)
MONOS PCT: 5 %
MPV: 9.5 fL (ref 7.5–12.5)
NEUTROS ABS: 5950 {cells}/uL (ref 1500–7800)
Neutrophils Relative %: 70 %
PLATELETS: 169 10*3/uL (ref 140–400)
RBC: 6.25 MIL/uL — ABNORMAL HIGH (ref 4.20–5.80)
RDW: 15 % (ref 11.0–15.0)
WBC: 8.5 10*3/uL (ref 3.8–10.8)

## 2016-01-03 LAB — TSH: TSH: 1.29 m[IU]/L (ref 0.40–4.50)

## 2016-01-03 LAB — HEMOGLOBIN A1C
Hgb A1c MFr Bld: 6.1 % — ABNORMAL HIGH (ref <5.7)
Mean Plasma Glucose: 128 mg/dL

## 2016-01-04 LAB — COMPLETE METABOLIC PANEL WITH GFR
ALT: 25 U/L (ref 9–46)
AST: 24 U/L (ref 10–35)
Albumin: 4.2 g/dL (ref 3.6–5.1)
Alkaline Phosphatase: 107 U/L (ref 40–115)
BILIRUBIN TOTAL: 0.8 mg/dL (ref 0.2–1.2)
BUN: 25 mg/dL (ref 7–25)
CALCIUM: 9.1 mg/dL (ref 8.6–10.3)
CHLORIDE: 97 mmol/L — AB (ref 98–110)
CO2: 25 mmol/L (ref 20–31)
CREATININE: 1.17 mg/dL (ref 0.70–1.25)
GFR, EST AFRICAN AMERICAN: 75 mL/min (ref >=60)
GFR, EST NON AFRICAN AMERICAN: 65 mL/min (ref >=60)
Glucose, Bld: 111 mg/dL — ABNORMAL HIGH (ref 70–99)
Potassium: 4 mmol/L (ref 3.5–5.3)
Sodium: 138 mmol/L (ref 135–146)
TOTAL PROTEIN: 6.9 g/dL (ref 6.1–8.1)

## 2016-01-04 LAB — MICROALBUMIN / CREATININE URINE RATIO
Creatinine, Urine: 128 mg/dL (ref 20–370)
Microalb Creat Ratio: 52 mcg/mg creat — ABNORMAL HIGH (ref <30)
Microalb, Ur: 6.7 mg/dL

## 2016-01-04 LAB — LIPID PANEL
CHOLESTEROL: 174 mg/dL (ref <200)
HDL: 36 mg/dL — ABNORMAL LOW (ref >40)
LDL Cholesterol: 75 mg/dL (ref <100)
TRIGLYCERIDES: 315 mg/dL — AB (ref <150)
Total CHOL/HDL Ratio: 4.8 Ratio (ref <5.0)
VLDL: 63 mg/dL — AB (ref <30)

## 2016-01-05 ENCOUNTER — Encounter: Payer: Self-pay | Admitting: Family Medicine

## 2016-01-05 ENCOUNTER — Ambulatory Visit (INDEPENDENT_AMBULATORY_CARE_PROVIDER_SITE_OTHER): Payer: Medicare Other | Admitting: Family Medicine

## 2016-01-05 VITALS — BP 136/84 | HR 84 | Temp 97.4°F | Resp 20 | Ht 66.0 in | Wt 236.0 lb

## 2016-01-05 DIAGNOSIS — D751 Secondary polycythemia: Secondary | ICD-10-CM | POA: Diagnosis not present

## 2016-01-05 DIAGNOSIS — I10 Essential (primary) hypertension: Secondary | ICD-10-CM | POA: Diagnosis not present

## 2016-01-05 DIAGNOSIS — E7800 Pure hypercholesterolemia, unspecified: Secondary | ICD-10-CM

## 2016-01-05 DIAGNOSIS — E118 Type 2 diabetes mellitus with unspecified complications: Secondary | ICD-10-CM | POA: Diagnosis not present

## 2016-01-05 MED ORDER — OMEPRAZOLE 40 MG PO CPDR: 40.0000 mg | DELAYED_RELEASE_CAPSULE | Freq: Every day | ORAL | 11 refills | Status: DC

## 2016-01-05 NOTE — Progress Notes (Signed)
Subjective:    Patient ID: Harry James, male    DOB: 12/24/1950, 65 y.o.   MRN: [id number]  HPI  Patient is here today for follow-up of his diabetes mellitus type 2, his hypertension, his hyperlipidemia, his chronic kidney disease, and has secondary polycythemia. Most recent lab work as listed below: Appointment on 01/03/2016  Component Date Value Ref Range Status  . Sodium 01/04/2016 138  135 - 146 mmol/L Final  . Potassium 01/04/2016 4.0  3.5 - 5.3 mmol/L Final  . Chloride 01/04/2016 97* 98 - 110 mmol/L Final  . CO2 01/04/2016 25  20 - 31 mmol/L Final  . Glucose, Bld 01/04/2016 111* 70 - 99 mg/dL Final  . BUN 01/04/2016 25  7 - 25 mg/dL Final  . Creat 01/04/2016 1.17  0.70 - 1.25 mg/dL Final   Comment:   For patients > or = 66 years of age: The upper reference limit for Creatinine is approximately 13% higher for people identified as African-American.     . Total Bilirubin 01/04/2016 0.8  0.2 - 1.2 mg/dL Final  . Alkaline Phosphatase 01/04/2016 107  40 - 115 U/L Final  . AST 01/04/2016 24  10 - 35 U/L Final  . ALT 01/04/2016 25  9 - 46 U/L Final  . Total Protein 01/04/2016 6.9  6.1 - 8.1 g/dL Final  . Albumin 01/04/2016 4.2  3.6 - 5.1 g/dL Final  . Calcium 01/04/2016 9.1  8.6 - 10.3 mg/dL Final  . GFR, Est African American 01/04/2016 75  >=60 mL/min Final  . GFR, Est Non African American 01/04/2016 65  >=60 mL/min Final  . TSH 01/03/2016 1.29  0.40 - 4.50 mIU/L Final  . Cholesterol 01/04/2016 174  <200 mg/dL Final  . Triglycerides 01/04/2016 315* <150 mg/dL Final  . HDL 01/04/2016 36* >40 mg/dL Final  . Total CHOL/HDL Ratio 01/04/2016 4.8  <5.0 Ratio Final  . VLDL 01/04/2016 63* <30 mg/dL Final  . LDL Cholesterol 01/04/2016 75  <100 mg/dL Final  . WBC 01/03/2016 8.5  3.8 - 10.8 K/uL Final  . RBC 01/03/2016 6.25* 4.20 - 5.80 MIL/uL Final  . Hemoglobin 01/03/2016 18.3* 13.0 - 17.0 g/dL Final  . HCT 01/03/2016 55.2* 38.5 - 50.0 % Final  . MCV 01/03/2016 88.3  80.0 - 100.0  fL Final  . MCH 01/03/2016 29.3  27.0 - 33.0 pg Final  . MCHC 01/03/2016 33.2  32.0 - 36.0 g/dL Final  . RDW 01/03/2016 15.0  11.0 - 15.0 % Final  . Platelets 01/03/2016 169  140 - 400 K/uL Final  . MPV 01/03/2016 9.5  7.5 - 12.5 fL Final  . Neutro Abs 01/03/2016 5950  1,500 - 7,800 cells/uL Final  . Lymphs Abs 01/03/2016 1785  850 - 3,900 cells/uL Final  . Monocytes Absolute 01/03/2016 425  200 - 950 cells/uL Final  . Eosinophils Absolute 01/03/2016 255  15 - 500 cells/uL Final  . Basophils Absolute 01/03/2016 85  0 - 200 cells/uL Final  . Neutrophils Relative % 01/03/2016 70  % Final  . Lymphocytes Relative 01/03/2016 21  % Final  . Monocytes Relative 01/03/2016 5  % Final  . Eosinophils Relative 01/03/2016 3  % Final  . Basophils Relative 01/03/2016 1  % Final  . Smear Review 01/03/2016 Criteria for review not met   Final  . Hgb A1c MFr Bld 01/03/2016 6.1* <5.7 % Final   Comment:   For someone without known diabetes, a hemoglobin A1c value between 5.7% and 6.4%  is consistent with prediabetes and should be confirmed with a follow-up test.   For someone with known diabetes, a value <7% indicates that their diabetes is well controlled. A1c targets should be individualized based on duration of diabetes, age, co-morbid conditions and other considerations.   This assay result is consistent with an increased risk of diabetes.   Currently, no consensus exists regarding use of hemoglobin A1c for diagnosis of diabetes in children.     . Mean Plasma Glucose 01/03/2016 128  mg/dL Final  . Creatinine, Urine 01/04/2016 128  20 - 370 mg/dL Final  . Microalb, Ur 01/04/2016 6.7  Not estab mg/dL Final  . Microalb Creat Ratio 01/04/2016 52* <30 mcg/mg creat Final   Comment: The ADA has defined abnormalities in albumin excretion as follows:           Category           Result                            (mcg/mg creatinine)                 Normal:    <30       Microalbuminuria:    30 - 299    Clinical albuminuria:    > or = 300   The ADA recommends that at least two of three specimens collected within a 3 - 6 month period be abnormal before considering a patient to be within a diagnostic category.     Marland Kitchen PSA 01/03/2016 1.4  <=4.0 ng/mL Final   Comment:   The total PSA value from this assay system is standardized against the WHO standard. The test result will be approximately 20% lower when compared to the equimolar-standardized total PSA ([employer]). Comparison of serial PSA results should be interpreted with this fact in mind.   This test was performed using the Siemens chemiluminescent method. Values obtained from different assay methods cannot be used interchangeably. PSA levels, regardless of value, should not be interpreted as absolute evidence of the presence or absence of disease.     His immunizations are up-to-date: Immunization History  Administered Date(s) Administered  . Influenza,inj,Quad PF,36+ Mos 11/13/2013, 11/15/2014, 12/07/2015  . Influenza-Unspecified 12/15/2012  . Pneumococcal Conjugate-13 11/15/2014  . Pneumococcal Polysaccharide-23 11/13/2013   Patient continues to smoke. I calculated his 10 year risk of ASCVD with the patient based on his lab work. He is 49% chance of having an event in the next 10 years. Were he to quit smoking he will drop this substantially. I emphasized this to the patient today. His diabetic eye exam was performed this year and was excellent. His diabetic foot exam is performed today and is significant for diminished pulses in his right foot at the dorsalis pedis. We discussed the warning with Invokana regarding amputation. The medicines causing him $400 a month area based on this he elects to stop the medication. He quit taking his proton pump inhibitor. He has a history of a bleeding ulcer in his stomach. Given the peripheral vascular disease I really want him on an aspirin and his high ASCVD risk. However, I am leery of  this given his history of a bleeding ulcer without him on a proton pump inhibitor. Therefore I recommended he resume his omeprazole  Past Medical History:  Diagnosis Date  . Chronic kidney disease    has one functional kidney  . Diabetes mellitus    prediabetes  .  Hyperlipidemia   . Hypertension   . Polycythemia, secondary 01/25/2014  . Tobacco abuse   . Ulcer ([place])    perforated duodenal ulcer 12/12   Past Surgical History:  Procedure Laterality Date  . CYSTECTOMY  under tongue  . LAPAROTOMY  12/16/2010   Harry James patch perf du Procedure: EXPLORATORY LAPAROTOMY;  Surgeon: [name], MD;  Location: [facility];  Service: General;  Laterality: N/A;   Current Outpatient Prescriptions on File Prior to Visit  Medication Sig Dispense Refill  . cloNIDine (CATAPRES) 0.3 MG tablet TAKE 1 TABLET BY MOUTH TWICE DAILY 180 tablet 0  . cloNIDine (CATAPRES) 0.3 MG tablet TAKE 1 TABLET BY MOUTH TWICE DAILY 60 tablet 0  . Fish Oil OIL by Does not apply route. 2 po qhs    . furosemide (LASIX) 80 MG tablet TAKE 1 TABLET BY MOUTH EVERY DAY 30 tablet 5  . INVOKANA 300 MG TABS tablet TAKE 1 TABLET BY MOUTH EVERY DAY BEFORE BREAKFAST 30 tablet 3  . metoprolol (LOPRESSOR) 100 MG tablet TAKE 1 TABLET BY MOUTH TWICE DAILY 60 tablet 11  . Multiple Vitamins-Minerals (MULTIVITAMINS THER. W/MINERALS) TABS Take 1 tablet by mouth daily. 30 each   . pioglitazone (ACTOS) 15 MG tablet TAKE 1 TABLET BY MOUTH EVERY DAY 90 tablet 1  . pioglitazone (ACTOS) 15 MG tablet TAKE 1 TABLET BY MOUTH DAILY 30 tablet 3  . simvastatin (ZOCOR) 40 MG tablet TAKE 1 TABLET BY MOUTH EVERY EVENING 90 tablet 3  . traMADol (ULTRAM) 50 MG tablet Take 2 tablets (100 mg total) by mouth 4 (four) times daily. (Patient taking differently: Take 100 mg by mouth as needed. ) 60 tablet 0   No current facility-administered medications on file prior to visit.    No Known Allergies Social History   Social History  . Marital status: Divorced     Spouse name: N/A  . Number of children: N/A  . Years of education: N/A   Occupational History  . Not on file.   Social History Main Topics  . Smoking status: Current Every Day Smoker    Packs/day: 1.00    Types: Cigarettes  . Smokeless tobacco: Never Used  . Alcohol use Yes     Comment: daily  . Drug use: No  . Sexual activity: Not on file   Other Topics Concern  . Not on file   Social History Narrative  . No narrative on file      Review of Systems  All other systems reviewed and are negative.      Objective:   Physical Exam  Constitutional: He is oriented to person, place, and time. He appears well-developed and well-nourished.  Neck: No JVD present. No thyromegaly present.  Cardiovascular: Normal rate, regular rhythm and normal heart sounds.  Exam reveals no gallop and no friction rub.   No murmur heard. Pulmonary/Chest: Effort normal and breath sounds normal. No respiratory distress. He has no wheezes. He has no rales. He exhibits no tenderness.  Abdominal: Soft. Bowel sounds are normal. He exhibits no distension and no mass. There is no tenderness. There is no rebound and no guarding.  Musculoskeletal: He exhibits no edema.  Neurological: He is alert and oriented to person, place, and time. He exhibits normal muscle tone.  Skin: No rash noted.  Vitals reviewed.         Assessment & Plan:  Controlled type 2 diabetes mellitus with complication, without long-term current use of insulin (HCC)  Essential hypertension  Pure  hypercholesterolemia  Polycythemia Blood pressure is excellent. No changes in his blood pressure medication at this time. Diabetes is well controlled with an A1c of 6.1. However we will discontinue Invokana given his diminished pulses in his feet and mainly due to cost. He cannot afford Jardiance. Strongly strongly encouraged him to quit smoking. Cholesterol is well controlled. Resume aspirin 81 mg a day in addition to omeprazole 40 mg a  day. Recheck in 6 months

## 2016-01-30 ENCOUNTER — Other Ambulatory Visit: Payer: Self-pay | Admitting: Family Medicine

## 2016-01-31 ENCOUNTER — Other Ambulatory Visit: Payer: Self-pay | Admitting: Family Medicine

## 2016-01-31 NOTE — Telephone Encounter (Signed)
Rx filled per protocol  

## 2016-02-03 ENCOUNTER — Other Ambulatory Visit (HOSPITAL_COMMUNITY): Payer: Medicare Other

## 2016-02-03 ENCOUNTER — Ambulatory Visit (HOSPITAL_COMMUNITY): Payer: Medicare Other | Admitting: Oncology

## 2016-03-05 ENCOUNTER — Encounter (HOSPITAL_COMMUNITY): Payer: Medicare Other | Attending: Oncology | Admitting: Oncology

## 2016-03-05 ENCOUNTER — Encounter (HOSPITAL_COMMUNITY): Payer: Medicare Other

## 2016-03-05 ENCOUNTER — Encounter (HOSPITAL_COMMUNITY): Payer: Self-pay | Admitting: Oncology

## 2016-03-05 ENCOUNTER — Other Ambulatory Visit: Payer: Self-pay | Admitting: Family Medicine

## 2016-03-05 DIAGNOSIS — D751 Secondary polycythemia: Secondary | ICD-10-CM | POA: Diagnosis not present

## 2016-03-05 DIAGNOSIS — Z720 Tobacco use: Secondary | ICD-10-CM

## 2016-03-05 LAB — CBC WITH DIFFERENTIAL/PLATELET
BASOS PCT: 0 %
Basophils Absolute: 0 10*3/uL (ref 0.0–0.1)
Eosinophils Absolute: 0.3 10*3/uL (ref 0.0–0.7)
Eosinophils Relative: 3 %
HCT: 56.1 % — ABNORMAL HIGH (ref 39.0–52.0)
Hemoglobin: 18.6 g/dL — ABNORMAL HIGH (ref 13.0–17.0)
Lymphocytes Relative: 15 %
Lymphs Abs: 1.4 10*3/uL (ref 0.7–4.0)
MCH: 29 pg (ref 26.0–34.0)
MCHC: 33.2 g/dL (ref 30.0–36.0)
MCV: 87.5 fL (ref 78.0–100.0)
MONO ABS: 0.4 10*3/uL (ref 0.1–1.0)
MONOS PCT: 5 %
NEUTROS ABS: 7.1 10*3/uL (ref 1.7–7.7)
Neutrophils Relative %: 78 %
Platelets: 177 10*3/uL (ref 150–400)
RBC: 6.41 MIL/uL — ABNORMAL HIGH (ref 4.22–5.81)
RDW: 15.2 % (ref 11.5–15.5)
WBC: 9.2 10*3/uL (ref 4.0–10.5)

## 2016-03-05 LAB — FERRITIN: Ferritin: 74 ng/mL (ref 24–336)

## 2016-03-05 NOTE — Progress Notes (Signed)
[name], MD [address] [place] [address]  Polycythemia, secondary - Plan: CBC with Differential, Ferritin, CBC with Differential, Ferritin, CBC with Differential, Ferritin  Tobacco abuse  CURRENT THERAPY: therapeutic phlebotomy to maintain HCT around 55%.  INTERVAL HISTORY: Harry James 66 y.o. male returns for followup of secondary polycythemia, JAK2 NEGATIVE.  He denies any new complaints.  He continues to smoke 0.75 packs per day.  He reports he did not take his antihypertensives this morning yet.  He says he usually takes them later in the morning.  He is not take these when he gets home.  He also reports white coat hypertension.  He reports getting blood pressures at home in the 778E systolically and 42P diastolically.  He denies: Hyperviscosity: CP Abd pain Myalgia Weakness Fatigue Headache blurred vision transient loss of vision Paresthesias slow mentation and/or a sense of depersonalization  Underlying pulmonary disease: shortness of breath dyspnea on exertion chronic cough history of cyanosis Hypersomnolence with unintentional sleep  Polycythemia Vera: post-bath pruritus Erythromelalgia Gout Arterial or venous thromboses Hemorrhage Early satiety due to the presence of splenomegaly.  Medications: Androgens (testosterone) replacement Anabolic steroids Performance enhancing medication, (ESA, RBC transfusions)   Review of Systems  Constitutional: Negative.  Negative for chills, fever and weight loss.  HENT: Negative.   Respiratory: Positive for shortness of breath (chronic, at baseline). Negative for cough.   Cardiovascular: Negative.  Negative for chest pain.  Gastrointestinal: Negative for constipation, diarrhea, nausea and vomiting.  Genitourinary: Negative.   Musculoskeletal: Negative.   Skin: Negative.        Red face  Neurological: Negative.  Negative for weakness.  Endo/Heme/Allergies: Negative.     Psychiatric/Behavioral: Negative.     Past Medical History:  Diagnosis Date  . Chronic kidney disease    has one functional kidney  . Diabetes mellitus    prediabetes  . Hyperlipidemia   . Hypertension   . Polycythemia, secondary 01/25/2014  . Tobacco abuse   . Ulcer ([place])    perforated duodenal ulcer 12/12    Past Surgical History:  Procedure Laterality Date  . CYSTECTOMY  under tongue  . LAPAROTOMY  12/16/2010   [name] patch perf du Procedure: EXPLORATORY LAPAROTOMY;  Surgeon: [name], MD;  Location: [facility];  Service: General;  Laterality: N/A;    History reviewed. No pertinent family history.  Social History   Social History  . Marital status: Divorced    Spouse name: N/A  . Number of children: N/A  . Years of education: N/A   Social History Main Topics  . Smoking status: Current Every Day Smoker    Packs/day: 1.00    Types: Cigarettes  . Smokeless tobacco: Never Used  . Alcohol use Yes     Comment: daily  . Drug use: No  . Sexual activity: Not Asked   Other Topics Concern  . None   Social History Narrative  . None     PHYSICAL EXAMINATION  ECOG PERFORMANCE STATUS: 0 - Asymptomatic  Vitals:   03/05/16 1128  BP: (!) 157/131  Pulse: 83  Resp: 18  Temp: 97.4 F (36.3 C)    GENERAL:alert, no distress, well nourished, well developed, comfortable, cooperative, obese, smiling and unaccompanied  SKIN: skin color, texture, turgor are normal, no rashes or significant lesions, positive for: erythematous face HEAD: Normocephalic, No masses, lesions, tenderness or abnormalities EYES: normal, EOMI EARS: External ears normal OROPHARYNX:lips, buccal mucosa, and tongue normal, mucous  membranes are moist and poor dentition  NECK: supple, no adenopathy, trachea midline LYMPH:  no palpable lymphadenopathy BREAST:not examined LUNGS: clear to auscultation , decreased breath sounds HEART: regular rate & rhythm, no murmurs, no gallops, S1 normal and S2  normal ABDOMEN:abdomen soft and normal bowel sounds BACK: Back symmetric, no curvature. EXTREMITIES:less then 2 second capillary refill, no joint deformities, effusion, or inflammation, no skin discoloration, no cyanosis  NEURO: alert & oriented x 3 with fluent speech, no focal motor/sensory deficits, gait normal   LABORATORY DATA: CBC    Component Value Date/Time   WBC 8.5 01/03/2016 1050   RBC 6.25 (H) 01/03/2016 1050   HGB 18.3 (H) 01/03/2016 1050   HCT 55.2 (H) 01/03/2016 1050   PLT 169 01/03/2016 1050   MCV 88.3 01/03/2016 1050   MCH 29.3 01/03/2016 1050   MCHC 33.2 01/03/2016 1050   RDW 15.0 01/03/2016 1050   LYMPHSABS 1,785 01/03/2016 1050   MONOABS 425 01/03/2016 1050   EOSABS 255 01/03/2016 1050   BASOSABS 85 01/03/2016 1050      Chemistry      Component Value Date/Time   NA 138 01/03/2016 1050   K 4.0 01/03/2016 1050   CL 97 (L) 01/03/2016 1050   CO2 25 01/03/2016 1050   BUN 25 01/03/2016 1050   CREATININE 1.17 01/03/2016 1050      Component Value Date/Time   CALCIUM 9.1 01/03/2016 1050   ALKPHOS 107 01/03/2016 1050   AST 24 01/03/2016 1050   ALT 25 01/03/2016 1050   BILITOT 0.8 01/03/2016 1050        PENDING LABS:   RADIOGRAPHIC STUDIES:  No results found.   PATHOLOGY:    ASSESSMENT AND PLAN:  Polycythemia, secondary Secondary polycythemia in the setting of elevated carboxyhemoglobin at 4.9% (0- 1.5% is normal range) with negative JAK2 testing, BCR/ABL, and EPO level.  He is a heavy smoker.  He does not give symptoms consistent with sleep apnea although he has the body habitus that would be c/w sleep apnea.   Labs today: CBC diff and ferritin.  I personally reviewed and went over laboratory results with the patient.  The results are noted within this dictation.  To complete work-up sleep study is indicated, but patient declines this test.  He continues to deny any symptoms of sleep apnea.  Hypertension is noted today.  He has not taken his  antihypertensives this morning yet.  He also reports white-coat hypertension.  He reports BPs at home in the 120's/70's.  Labs in 3 and 6 months: CBC diff, ferritin.  Smoking cessation education is provided.  He is smoking 3/4 of ppd.  Return in 6 months for follow-up.  Tobacco abuse I have discussed lung cancer screening recommendations for which he qualifies for.  He declines at this time.  A lung cancer screening counseling and shared decision making visit. Age: 1 Pack year smoking history: 30+ Type of tobacco abuse: cigarette Current smoker or < 15 years of cessation: current smoker No current symptoms of lung cancer Risks and benefits of lung cancer screening discussed:  Negative- over-diagnosis, radiation exposure, false positives, and additional testing  Positive- discover early stage lung cancer resulting in higher incidence of cure Patient educated regarding the importance of adherence to continued lung cancer screening. Currently, there are no co-morbidities to prevent treatment to therapy for lung cancer and the patient is agreeable to pursue treatment if a malignancy is discovered.  [place] Preventative Services Task Force recommend annual screening for lung  cancer with low-dose CT in adults aged 75- 64 years who have a 30 pack year smoking history and currently smoke or have quit smoking within the past 15 years.  Screening should be discontinued once a person has not smoked for 15 years or develops a health problem that substantially limits life expectancy or the ability or willingness to have curative lung surgery.  It is a category B recommendation.  Similar stances are provided by CMS, NCCN, and AATS.  Currently, there are no co-morbidities to prevent treatment to therapy for lung cancer and the patient is agreeable to pursue treatment if a malignancy is discovered.   ORDERS PLACED FOR THIS ENCOUNTER: Orders Placed This Encounter  Procedures  . CBC with Differential  .  Ferritin  . CBC with Differential  . Ferritin  . CBC with Differential  . Ferritin    MEDICATIONS PRESCRIBED THIS ENCOUNTER: No orders of the defined types were placed in this encounter.   THERAPY PLAN:  We will continue to monitor blood counts and recommend therapeutic phlebotomy to maintain HGB ~ 55%.  All questions were answered. The patient knows to call the clinic with any problems, questions or concerns. We can certainly see the patient much sooner if necessary.  Patient and plan discussed with Dr. Twana First and she is in agreement with the aforementioned.   This note is electronically signed by: Doy Mince 03/05/2016 12:06 PM

## 2016-03-05 NOTE — Patient Instructions (Signed)
[facility] at [facility] Discharge Instructions  RECOMMENDATIONS MADE BY THE CONSULTANT AND ANY TEST RESULTS WILL BE SENT TO YOUR REFERRING PHYSICIAN.  You were seen today by Kirby Crigler PA-C. Labs today, we will call you with results. Return in 3 months for labs. Return in 6 months for labs and follow up.   Thank you for choosing [place] at [facility] to provide your oncology and hematology care.  To afford each patient quality time with our provider, please arrive at least 15 minutes before your scheduled appointment time.    If you have a lab appointment with the [facility] please come in thru the  Main Entrance and check in at the main information desk  You need to re-schedule your appointment should you arrive 10 or more minutes late.  We strive to give you quality time with our providers, and arriving late affects you and other patients whose appointments are after yours.  Also, if you no show three or more times for appointments you may be dismissed from the clinic at the providers discretion.     Again, thank you for choosing [facility].  Our hope is that these requests will decrease the amount of time that you wait before being seen by our physicians.       _____________________________________________________________  Should you have questions after your visit to [facility], please contact our office at [phone] between the hours of 8:30 a.m. and 4:30 p.m.  Voicemails left after 4:30 p.m. will not be returned until the following business day.  For prescription refill requests, have your pharmacy contact our office.       Resources For Cancer Patients and their Caregivers ? American Cancer Society: Can assist with transportation, wigs, general needs, runs Look Good Feel Better.        747-478-0409 ? Cancer Care: Provides financial assistance, online support groups, medication/co-pay  assistance.  1-800-813-HOPE 819-754-2631) ? [facility] Assists [place] cancer patients and their families through emotional , educational and financial support.  6314850994 ? [place] DSS Where to apply for food stamps, Medicaid and utility assistance. (810)867-4310 ? RCATS: Transportation to medical appointments. 864-293-4344 ? Social Security Administration: May apply for disability if have a Stage IV cancer. 234-246-0454 225 813 3764 ? [employer], Disability and Transit Services: Assists with nutrition, care and transit needs. [place] Support Programs: @10RELATIVEDAYS @ > Cancer Support Group  2nd Tuesday of the month 1pm-2pm, Journey Room  > Creative Journey  3rd Tuesday of the month 1130am-1pm, Journey Room  > Look Good Feel Better  1st Wednesday of the month 10am-12 noon, Journey Room (Call [facility] to register [phone])

## 2016-03-05 NOTE — Assessment & Plan Note (Addendum)
Secondary polycythemia in the setting of elevated carboxyhemoglobin at 4.9% (0- 1.5% is normal range) with negative JAK2 testing, BCR/ABL, and EPO level.  He is a heavy smoker.  He does not give symptoms consistent with sleep apnea although he has the body habitus that would be c/w sleep apnea.   Labs today: CBC diff and ferritin.  I personally reviewed and went over laboratory results with the patient.  The results are noted within this dictation.  To complete work-up sleep study is indicated, but patient declines this test.  He continues to deny any symptoms of sleep apnea.  Hypertension is noted today.  He has not taken his antihypertensives this morning yet.  He also reports white-coat hypertension.  He reports BPs at home in the 120's/70's.  Labs in 3 and 6 months: CBC diff, ferritin.  Smoking cessation education is provided.  He is smoking 3/4 of ppd.  Return in 6 months for follow-up.

## 2016-03-05 NOTE — Assessment & Plan Note (Addendum)
I have discussed lung cancer screening recommendations for which he qualifies for.  He declines at this time.  A lung cancer screening counseling and shared decision making visit. Age: 66 Pack year smoking history: 30+ Type of tobacco abuse: cigarette Current smoker or < 15 years of cessation: current smoker No current symptoms of lung cancer Risks and benefits of lung cancer screening discussed:  Negative- over-diagnosis, radiation exposure, false positives, and additional testing  Positive- discover early stage lung cancer resulting in higher incidence of cure Patient educated regarding the importance of adherence to continued lung cancer screening. Currently, there are no co-morbidities to prevent treatment to therapy for lung cancer and the patient is agreeable to pursue treatment if a malignancy is discovered.  [place] Preventative Services Task Force recommend annual screening for lung cancer with low-dose CT in adults aged 32- 76 years who have a 30 pack year smoking history and currently smoke or have quit smoking within the past 15 years.  Screening should be discontinued once a person has not smoked for 15 years or develops a health problem that substantially limits life expectancy or the ability or willingness to have curative lung surgery.  It is a category B recommendation.  Similar stances are provided by CMS, NCCN, and AATS.  Currently, there are no co-morbidities to prevent treatment to therapy for lung cancer and the patient is agreeable to pursue treatment if a malignancy is discovered.

## 2016-04-06 ENCOUNTER — Other Ambulatory Visit: Payer: Self-pay | Admitting: Family Medicine

## 2016-04-06 NOTE — Telephone Encounter (Signed)
Medication refilled per protocol. 

## 2016-05-04 ENCOUNTER — Other Ambulatory Visit: Payer: Self-pay | Admitting: Family Medicine

## 2016-05-04 NOTE — Telephone Encounter (Signed)
Medication refilled per protocol. 

## 2016-05-05 ENCOUNTER — Other Ambulatory Visit: Payer: Self-pay | Admitting: Family Medicine

## 2016-05-07 ENCOUNTER — Other Ambulatory Visit: Payer: Self-pay | Admitting: Family Medicine

## 2016-05-15 ENCOUNTER — Other Ambulatory Visit: Payer: Medicare Other

## 2016-05-18 ENCOUNTER — Ambulatory Visit: Payer: Medicare Other | Admitting: Family Medicine

## 2016-06-01 ENCOUNTER — Encounter (HOSPITAL_COMMUNITY): Payer: Medicare Other | Attending: Oncology

## 2016-06-01 DIAGNOSIS — D751 Secondary polycythemia: Secondary | ICD-10-CM | POA: Diagnosis not present

## 2016-06-01 LAB — CBC WITH DIFFERENTIAL/PLATELET
Basophils Absolute: 0.1 10*3/uL (ref 0.0–0.1)
Basophils Relative: 1 %
Eosinophils Absolute: 0.3 10*3/uL (ref 0.0–0.7)
Eosinophils Relative: 3 %
HEMATOCRIT: 51.9 % (ref 39.0–52.0)
HEMOGLOBIN: 17.4 g/dL — AB (ref 13.0–17.0)
LYMPHS ABS: 1.7 10*3/uL (ref 0.7–4.0)
Lymphocytes Relative: 18 %
MCH: 30.5 pg (ref 26.0–34.0)
MCHC: 33.5 g/dL (ref 30.0–36.0)
MCV: 91.1 fL (ref 78.0–100.0)
MONO ABS: 0.4 10*3/uL (ref 0.1–1.0)
MONOS PCT: 4 %
NEUTROS ABS: 7 10*3/uL (ref 1.7–7.7)
NEUTROS PCT: 74 %
Platelets: 190 10*3/uL (ref 150–400)
RBC: 5.7 MIL/uL (ref 4.22–5.81)
RDW: 14.1 % (ref 11.5–15.5)
WBC: 9.5 10*3/uL (ref 4.0–10.5)

## 2016-06-01 LAB — FERRITIN: Ferritin: 77 ng/mL (ref 24–336)

## 2016-06-13 ENCOUNTER — Other Ambulatory Visit: Payer: Self-pay | Admitting: Family Medicine

## 2016-06-13 DIAGNOSIS — E785 Hyperlipidemia, unspecified: Secondary | ICD-10-CM

## 2016-06-13 DIAGNOSIS — E119 Type 2 diabetes mellitus without complications: Secondary | ICD-10-CM

## 2016-06-13 DIAGNOSIS — N289 Disorder of kidney and ureter, unspecified: Secondary | ICD-10-CM

## 2016-06-13 DIAGNOSIS — I10 Essential (primary) hypertension: Secondary | ICD-10-CM

## 2016-06-18 ENCOUNTER — Other Ambulatory Visit: Payer: Medicare Other

## 2016-06-18 DIAGNOSIS — I10 Essential (primary) hypertension: Secondary | ICD-10-CM

## 2016-06-18 DIAGNOSIS — N289 Disorder of kidney and ureter, unspecified: Secondary | ICD-10-CM

## 2016-06-18 DIAGNOSIS — E119 Type 2 diabetes mellitus without complications: Secondary | ICD-10-CM

## 2016-06-18 DIAGNOSIS — E785 Hyperlipidemia, unspecified: Secondary | ICD-10-CM

## 2016-06-19 LAB — CBC WITH DIFFERENTIAL/PLATELET
BASOS ABS: 0 {cells}/uL (ref 0–200)
Basophils Relative: 0 %
EOS PCT: 3 %
Eosinophils Absolute: 282 cells/uL (ref 15–500)
HCT: 54 % — ABNORMAL HIGH (ref 38.5–50.0)
Hemoglobin: 17.5 g/dL — ABNORMAL HIGH (ref 13.0–17.0)
Lymphocytes Relative: 19 %
Lymphs Abs: 1786 cells/uL (ref 850–3900)
MCH: 30 pg (ref 27.0–33.0)
MCHC: 32.4 g/dL (ref 32.0–36.0)
MCV: 92.5 fL (ref 80.0–100.0)
MONOS PCT: 5 %
MPV: 8.7 fL (ref 7.5–12.5)
Monocytes Absolute: 470 cells/uL (ref 200–950)
NEUTROS ABS: 6862 {cells}/uL (ref 1500–7800)
Neutrophils Relative %: 73 %
PLATELETS: 178 10*3/uL (ref 140–400)
RBC: 5.84 MIL/uL — AB (ref 4.20–5.80)
RDW: 14.5 % (ref 11.0–15.0)
WBC: 9.4 10*3/uL (ref 3.8–10.8)

## 2016-06-19 LAB — COMPREHENSIVE METABOLIC PANEL
ALT: 18 U/L (ref 9–46)
AST: 16 U/L (ref 10–35)
Albumin: 4.3 g/dL (ref 3.6–5.1)
Alkaline Phosphatase: 143 U/L — ABNORMAL HIGH (ref 40–115)
BILIRUBIN TOTAL: 0.6 mg/dL (ref 0.2–1.2)
BUN: 31 mg/dL — ABNORMAL HIGH (ref 7–25)
CO2: 25 mmol/L (ref 20–31)
CREATININE: 1.2 mg/dL (ref 0.70–1.25)
Calcium: 9.2 mg/dL (ref 8.6–10.3)
Chloride: 99 mmol/L (ref 98–110)
GLUCOSE: 131 mg/dL — AB (ref 70–99)
Potassium: 4.3 mmol/L (ref 3.5–5.3)
SODIUM: 139 mmol/L (ref 135–146)
Total Protein: 7.1 g/dL (ref 6.1–8.1)

## 2016-06-19 LAB — LIPID PANEL
CHOL/HDL RATIO: 5.2 ratio — AB (ref <5.0)
Cholesterol: 208 mg/dL — ABNORMAL HIGH (ref <200)
HDL: 40 mg/dL — ABNORMAL LOW (ref >40)
LDL Cholesterol: 104 mg/dL — ABNORMAL HIGH (ref <100)
Triglycerides: 322 mg/dL — ABNORMAL HIGH (ref <150)
VLDL: 64 mg/dL — AB (ref <30)

## 2016-06-19 LAB — HEMOGLOBIN A1C
Hgb A1c MFr Bld: 6.7 % — ABNORMAL HIGH (ref <5.7)
Mean Plasma Glucose: 146 mg/dL

## 2016-06-21 ENCOUNTER — Ambulatory Visit (INDEPENDENT_AMBULATORY_CARE_PROVIDER_SITE_OTHER): Payer: Medicare Other | Admitting: Family Medicine

## 2016-06-21 ENCOUNTER — Encounter: Payer: Self-pay | Admitting: Family Medicine

## 2016-06-21 VITALS — BP 134/90 | HR 78 | Temp 98.6°F | Resp 20 | Ht 66.0 in | Wt 237.0 lb

## 2016-06-21 DIAGNOSIS — E118 Type 2 diabetes mellitus with unspecified complications: Secondary | ICD-10-CM | POA: Diagnosis not present

## 2016-06-21 DIAGNOSIS — E7800 Pure hypercholesterolemia, unspecified: Secondary | ICD-10-CM | POA: Diagnosis not present

## 2016-06-21 DIAGNOSIS — I10 Essential (primary) hypertension: Secondary | ICD-10-CM

## 2016-06-21 DIAGNOSIS — F17200 Nicotine dependence, unspecified, uncomplicated: Secondary | ICD-10-CM | POA: Diagnosis not present

## 2016-06-21 DIAGNOSIS — D751 Secondary polycythemia: Secondary | ICD-10-CM | POA: Diagnosis not present

## 2016-06-21 MED ORDER — TRAMADOL HCL 50 MG PO TABS: 100.0000 mg | ORAL_TABLET | Freq: Four times a day (QID) | ORAL | 0 refills | Status: DC

## 2016-06-21 NOTE — Progress Notes (Signed)
Subjective:    Patient ID: Harry James, male    DOB: [birth year], 66 y.o.   MRN: [id number]  HPI  Patient is here today for follow-up of his diabetes mellitus type 2, his hypertension, his hyperlipidemia, his chronic kidney disease, and has secondary polycythemia. Patient continues to smoke.  His diabetic eye exam was performed this year and was excellent. His diabetic foot exam is performed today and is significant for diminished pulses in his left foot at the dorsalis pedis. Given the peripheral vascular disease, he is on an aspirin.Marland Kitchen He denies any chest pain. He denies any dyspnea on exertion although he is sedentary. He denies any myalgias or right upper quadrant pain. He denies any neuropathy in his feet. Most recent lab work as listed below: Appointment on 06/18/2016  Component Date Value Ref Range Status  . WBC 06/18/2016 9.4  3.8 - 10.8 K/uL Final  . RBC 06/18/2016 5.84* 4.20 - 5.80 MIL/uL Final  . Hemoglobin 06/18/2016 17.5* 13.0 - 17.0 g/dL Final  . HCT 06/18/2016 54.0* 38.5 - 50.0 % Final  . MCV 06/18/2016 92.5  80.0 - 100.0 fL Final  . MCH 06/18/2016 30.0  27.0 - 33.0 pg Final  . MCHC 06/18/2016 32.4  32.0 - 36.0 g/dL Final  . RDW 06/18/2016 14.5  11.0 - 15.0 % Final  . Platelets 06/18/2016 178  140 - 400 K/uL Final  . MPV 06/18/2016 8.7  7.5 - 12.5 fL Final  . Neutro Abs 06/18/2016 6862  1,500 - 7,800 cells/uL Final  . Lymphs Abs 06/18/2016 1786  850 - 3,900 cells/uL Final  . Monocytes Absolute 06/18/2016 470  200 - 950 cells/uL Final  . Eosinophils Absolute 06/18/2016 282  15 - 500 cells/uL Final  . Basophils Absolute 06/18/2016 0  0 - 200 cells/uL Final  . Neutrophils Relative % 06/18/2016 73  % Final  . Lymphocytes Relative 06/18/2016 19  % Final  . Monocytes Relative 06/18/2016 5  % Final  . Eosinophils Relative 06/18/2016 3  % Final  . Basophils Relative 06/18/2016 0  % Final  . Smear Review 06/18/2016 Criteria for review not met   Final  . Sodium 06/18/2016 139   135 - 146 mmol/L Final  . Potassium 06/18/2016 4.3  3.5 - 5.3 mmol/L Final  . Chloride 06/18/2016 99  98 - 110 mmol/L Final  . CO2 06/18/2016 25  20 - 31 mmol/L Final  . Glucose, Bld 06/18/2016 131* 70 - 99 mg/dL Final  . BUN 06/18/2016 31* 7 - 25 mg/dL Final  . Creat 06/18/2016 1.20  0.70 - 1.25 mg/dL Final   Comment:   For patients > or = 67 years of age: The upper reference limit for Creatinine is approximately 13% higher for people identified as African-American.     . Total Bilirubin 06/18/2016 0.6  0.2 - 1.2 mg/dL Final  . Alkaline Phosphatase 06/18/2016 143* 40 - 115 U/L Final  . AST 06/18/2016 16  10 - 35 U/L Final  . ALT 06/18/2016 18  9 - 46 U/L Final  . Total Protein 06/18/2016 7.1  6.1 - 8.1 g/dL Final  . Albumin 06/18/2016 4.3  3.6 - 5.1 g/dL Final  . Calcium 06/18/2016 9.2  8.6 - 10.3 mg/dL Final  . Hgb A1c MFr Bld 06/18/2016 6.7* <5.7 % Final   Comment:   For someone without known diabetes, a hemoglobin A1c value of 6.5% or greater indicates that they may have diabetes and this should be confirmed with a  follow-up test.   For someone with known diabetes, a value <7% indicates that their diabetes is well controlled and a value greater than or equal to 7% indicates suboptimal control. A1c targets should be individualized based on duration of diabetes, age, comorbid conditions, and other considerations.   Currently, no consensus exists for use of hemoglobin A1c for diagnosis of diabetes for children.     . Mean Plasma Glucose 06/18/2016 146  mg/dL Final  . Cholesterol 06/18/2016 208* <200 mg/dL Final  . Triglycerides 06/18/2016 322* <150 mg/dL Final  . HDL 06/18/2016 40* >40 mg/dL Final  . Total CHOL/HDL Ratio 06/18/2016 5.2* <5.0 Ratio Final  . VLDL 06/18/2016 64* <30 mg/dL Final  . LDL Cholesterol 06/18/2016 104* <100 mg/dL Final  Appointment on 06/01/2016  Component Date Value Ref Range Status  . WBC 06/01/2016 9.5  4.0 - 10.5 K/uL Final  . RBC 06/01/2016  5.70  4.22 - 5.81 MIL/uL Final  . Hemoglobin 06/01/2016 17.4* 13.0 - 17.0 g/dL Final  . HCT 06/01/2016 51.9  39.0 - 52.0 % Final  . MCV 06/01/2016 91.1  78.0 - 100.0 fL Final  . MCH 06/01/2016 30.5  26.0 - 34.0 pg Final  . MCHC 06/01/2016 33.5  30.0 - 36.0 g/dL Final  . RDW 06/01/2016 14.1  11.5 - 15.5 % Final  . Platelets 06/01/2016 190  150 - 400 K/uL Final  . Neutrophils Relative % 06/01/2016 74  % Final  . Neutro Abs 06/01/2016 7.0  1.7 - 7.7 K/uL Final  . Lymphocytes Relative 06/01/2016 18  % Final  . Lymphs Abs 06/01/2016 1.7  0.7 - 4.0 K/uL Final  . Monocytes Relative 06/01/2016 4  % Final  . Monocytes Absolute 06/01/2016 0.4  0.1 - 1.0 K/uL Final  . Eosinophils Relative 06/01/2016 3  % Final  . Eosinophils Absolute 06/01/2016 0.3  0.0 - 0.7 K/uL Final  . Basophils Relative 06/01/2016 1  % Final  . Basophils Absolute 06/01/2016 0.1  0.0 - 0.1 K/uL Final  . Ferritin 06/01/2016 77  24 - 336 ng/mL Final   Performed at [facility], [place] [address], [place], [place] [address]    Past Medical History:  Diagnosis Date  . Chronic kidney disease    has one functional kidney  . Diabetes mellitus    prediabetes  . Hyperlipidemia   . Hypertension   . Polycythemia, secondary 01/25/2014  . Tobacco abuse   . Ulcer    perforated duodenal ulcer 12/12   Past Surgical History:  Procedure Laterality Date  . CYSTECTOMY  under tongue  . LAPAROTOMY  12/16/2010   [name] patch perf du Procedure: EXPLORATORY LAPAROTOMY;  Surgeon: [name], MD;  Location: [facility];  Service: General;  Laterality: N/A;   Current Outpatient Prescriptions on File Prior to Visit  Medication Sig Dispense Refill  . cloNIDine (CATAPRES) 0.3 MG tablet TAKE 1 TABLET BY MOUTH TWICE DAILY 60 tablet 2  . Fish Oil OIL by Does not apply route. 2 po qhs    . furosemide (LASIX) 80 MG tablet TAKE 1 TABLET BY MOUTH EVERY DAY 90 tablet 1  . metoprolol (LOPRESSOR) 100 MG tablet TAKE 1 TABLET BY MOUTH TWICE DAILY 180  tablet 1  . Multiple Vitamins-Minerals (MULTIVITAMINS THER. W/MINERALS) TABS Take 1 tablet by mouth daily. 30 each   . omeprazole (PRILOSEC) 40 MG capsule Take 1 capsule (40 mg total) by mouth daily. 30 capsule 11  . pioglitazone (ACTOS) 15 MG tablet TAKE 1 TABLET BY  MOUTH EVERY DAY 90 tablet 1  . pioglitazone (ACTOS) 15 MG tablet TAKE 1 TABLET BY MOUTH DAILY 30 tablet 3  . simvastatin (ZOCOR) 40 MG tablet TAKE 1 TABLET BY MOUTH EVERY EVENING 90 tablet 0   No current facility-administered medications on file prior to visit.    No Known Allergies Social History   Social History  . Marital status: Divorced    Spouse name: N/A  . Number of children: N/A  . Years of education: N/A   Occupational History  . Not on file.   Social History Main Topics  . Smoking status: Current Every Day Smoker    Packs/day: 1.00    Types: Cigarettes  . Smokeless tobacco: Never Used  . Alcohol use Yes     Comment: daily  . Drug use: No  . Sexual activity: Not on file   Other Topics Concern  . Not on file   Social History Narrative  . No narrative on file      Review of Systems  All other systems reviewed and are negative.      Objective:   Physical Exam  Constitutional: He is oriented to person, place, and time. He appears well-developed and well-nourished.  Neck: No JVD present. No thyromegaly present.  Cardiovascular: Normal rate, regular rhythm and normal heart sounds.  Exam reveals no gallop and no friction rub.   No murmur heard. Pulmonary/Chest: Effort normal and breath sounds normal. No respiratory distress. He has no wheezes. He has no rales. He exhibits no tenderness.  Abdominal: Soft. Bowel sounds are normal. He exhibits no distension and no mass. There is no tenderness. There is no rebound and no guarding.  Musculoskeletal: He exhibits no edema.  Neurological: He is alert and oriented to person, place, and time. He exhibits normal muscle tone.  Skin: No rash noted.  Vitals  reviewed.         Assessment & Plan:  Controlled type 2 diabetes mellitus with complication, without long-term current use of insulin (HCC)  Essential hypertension  Pure hypercholesterolemia  Polycythemia  Smoking Blood pressure is excellent. No changes in his blood pressure medication at this time. Diabetes is borderline controlled with an A1c of 6.7. I recommended 10 pounds weight loss and increasing aerobic exercise to help manage his diabetes, lower his LDL cholesterol, and improve his peripheral vascular disease. Strongly strongly encouraged him to quit smoking. Cholesterol is fairly controlled. Continue aspirin 81 mg a day in addition to omeprazole 40 mg a day. Recheck in 6 months

## 2016-07-04 ENCOUNTER — Other Ambulatory Visit: Payer: Self-pay | Admitting: Family Medicine

## 2016-08-03 ENCOUNTER — Other Ambulatory Visit: Payer: Self-pay | Admitting: Family Medicine

## 2016-08-06 ENCOUNTER — Other Ambulatory Visit: Payer: Self-pay | Admitting: Family Medicine

## 2016-09-03 ENCOUNTER — Ambulatory Visit (HOSPITAL_COMMUNITY): Payer: Medicare Other | Admitting: Oncology

## 2016-09-03 ENCOUNTER — Other Ambulatory Visit (HOSPITAL_COMMUNITY): Payer: Medicare Other

## 2016-09-06 ENCOUNTER — Other Ambulatory Visit (HOSPITAL_COMMUNITY): Payer: Medicare Other

## 2016-09-06 ENCOUNTER — Ambulatory Visit (HOSPITAL_COMMUNITY): Payer: Medicare Other | Admitting: Adult Health

## 2016-10-04 ENCOUNTER — Other Ambulatory Visit: Payer: Self-pay | Admitting: Family Medicine

## 2016-10-04 NOTE — Telephone Encounter (Signed)
Medication refilled per protocol. 

## 2016-11-03 ENCOUNTER — Other Ambulatory Visit: Payer: Self-pay | Admitting: Family Medicine

## 2016-11-05 ENCOUNTER — Other Ambulatory Visit: Payer: Self-pay | Admitting: Family Medicine

## 2017-01-04 ENCOUNTER — Other Ambulatory Visit: Payer: Self-pay | Admitting: Family Medicine

## 2017-03-08 ENCOUNTER — Ambulatory Visit: Payer: Medicare Other | Admitting: Family Medicine

## 2017-03-09 ENCOUNTER — Other Ambulatory Visit: Payer: Self-pay | Admitting: Family Medicine

## 2017-05-06 ENCOUNTER — Other Ambulatory Visit: Payer: Self-pay | Admitting: Family Medicine

## 2017-05-08 ENCOUNTER — Other Ambulatory Visit: Payer: Self-pay | Admitting: Family Medicine

## 2017-05-09 ENCOUNTER — Other Ambulatory Visit: Payer: Self-pay | Admitting: Family Medicine

## 2017-06-08 ENCOUNTER — Other Ambulatory Visit: Payer: Self-pay | Admitting: Family Medicine

## 2017-06-10 ENCOUNTER — Other Ambulatory Visit: Payer: Self-pay | Admitting: Family Medicine

## 2017-07-09 ENCOUNTER — Other Ambulatory Visit: Payer: Self-pay | Admitting: Family Medicine

## 2017-08-04 ENCOUNTER — Other Ambulatory Visit: Payer: Self-pay | Admitting: Family Medicine

## 2017-08-06 ENCOUNTER — Other Ambulatory Visit: Payer: Medicare Other

## 2017-08-06 DIAGNOSIS — N189 Chronic kidney disease, unspecified: Secondary | ICD-10-CM

## 2017-08-06 DIAGNOSIS — I10 Essential (primary) hypertension: Secondary | ICD-10-CM | POA: Diagnosis not present

## 2017-08-06 DIAGNOSIS — E118 Type 2 diabetes mellitus with unspecified complications: Secondary | ICD-10-CM

## 2017-08-06 DIAGNOSIS — D751 Secondary polycythemia: Secondary | ICD-10-CM

## 2017-08-07 LAB — CBC WITH DIFFERENTIAL/PLATELET
BASOS ABS: 75 {cells}/uL (ref 0–200)
BASOS PCT: 0.8 %
EOS ABS: 254 {cells}/uL (ref 15–500)
Eosinophils Relative: 2.7 %
HCT: 52.9 % — ABNORMAL HIGH (ref 38.5–50.0)
Hemoglobin: 18.3 g/dL — ABNORMAL HIGH (ref 13.2–17.1)
Lymphs Abs: 1523 cells/uL (ref 850–3900)
MCH: 30.8 pg (ref 27.0–33.0)
MCHC: 34.6 g/dL (ref 32.0–36.0)
MCV: 88.9 fL (ref 80.0–100.0)
MONOS PCT: 6.1 %
MPV: 9.9 fL (ref 7.5–12.5)
Neutro Abs: 6975 cells/uL (ref 1500–7800)
Neutrophils Relative %: 74.2 %
PLATELETS: 199 10*3/uL (ref 140–400)
RBC: 5.95 10*6/uL — ABNORMAL HIGH (ref 4.20–5.80)
RDW: 13.5 % (ref 11.0–15.0)
TOTAL LYMPHOCYTE: 16.2 %
WBC mixed population: 573 cells/uL (ref 200–950)
WBC: 9.4 10*3/uL (ref 3.8–10.8)

## 2017-08-07 LAB — COMPREHENSIVE METABOLIC PANEL
AG Ratio: 1.9 (calc) (ref 1.0–2.5)
ALBUMIN MSPROF: 4.5 g/dL (ref 3.6–5.1)
ALKALINE PHOSPHATASE (APISO): 111 U/L (ref 40–115)
ALT: 24 U/L (ref 9–46)
AST: 20 U/L (ref 10–35)
BILIRUBIN TOTAL: 0.7 mg/dL (ref 0.2–1.2)
BUN: 23 mg/dL (ref 7–25)
CALCIUM: 9.6 mg/dL (ref 8.6–10.3)
CO2: 30 mmol/L (ref 20–32)
CREATININE: 1.11 mg/dL (ref 0.70–1.25)
Chloride: 97 mmol/L — ABNORMAL LOW (ref 98–110)
Globulin: 2.4 g/dL (calc) (ref 1.9–3.7)
Glucose, Bld: 129 mg/dL — ABNORMAL HIGH (ref 65–99)
Potassium: 4.4 mmol/L (ref 3.5–5.3)
Sodium: 143 mmol/L (ref 135–146)
Total Protein: 6.9 g/dL (ref 6.1–8.1)

## 2017-08-07 LAB — LIPID PANEL
CHOL/HDL RATIO: 3.6 (calc) (ref <5.0)
Cholesterol: 171 mg/dL (ref <200)
HDL: 48 mg/dL (ref >40)
LDL CHOLESTEROL (CALC): 91 mg/dL
Non-HDL Cholesterol (Calc): 123 mg/dL (calc) (ref <130)
Triglycerides: 231 mg/dL — ABNORMAL HIGH (ref <150)

## 2017-08-07 LAB — HEMOGLOBIN A1C
Hgb A1c MFr Bld: 6.8 % of total Hgb — ABNORMAL HIGH (ref <5.7)
MEAN PLASMA GLUCOSE: 148 (calc)
eAG (mmol/L): 8.2 (calc)

## 2017-08-09 ENCOUNTER — Encounter: Payer: Self-pay | Admitting: Family Medicine

## 2017-08-09 ENCOUNTER — Ambulatory Visit (INDEPENDENT_AMBULATORY_CARE_PROVIDER_SITE_OTHER): Payer: Medicare Other | Admitting: Family Medicine

## 2017-08-09 VITALS — BP 130/82 | HR 74 | Temp 98.3°F | Resp 20 | Ht 66.0 in | Wt 234.0 lb

## 2017-08-09 DIAGNOSIS — E118 Type 2 diabetes mellitus with unspecified complications: Secondary | ICD-10-CM | POA: Diagnosis not present

## 2017-08-09 DIAGNOSIS — D751 Secondary polycythemia: Secondary | ICD-10-CM

## 2017-08-09 DIAGNOSIS — F17200 Nicotine dependence, unspecified, uncomplicated: Secondary | ICD-10-CM

## 2017-08-09 DIAGNOSIS — E7800 Pure hypercholesterolemia, unspecified: Secondary | ICD-10-CM | POA: Diagnosis not present

## 2017-08-09 DIAGNOSIS — I10 Essential (primary) hypertension: Secondary | ICD-10-CM | POA: Diagnosis not present

## 2017-08-09 DIAGNOSIS — N189 Chronic kidney disease, unspecified: Secondary | ICD-10-CM

## 2017-08-09 NOTE — Progress Notes (Signed)
Subjective:    Patient ID: Harry James, male    DOB: Aug 15, 1950, 67 y.o.   MRN: [id number]  Medication Refill     Patient is here today for follow-up of his diabetes mellitus type 2, his hypertension, his hyperlipidemia, his chronic kidney disease, and has secondary polycythemia. Patient continues to smoke.  He is overdue for a colonoscopy.  He refuses this.  He is overdue for diabetic eye exam.  He declines this today.  His immunizations are up-to-date.  I recommended a diabetic eye exam strongly however the patient states that he will consider this and schedule it on his own in the future.  We discussed Cologuard for colon cancer screening and the patient declined this as well.  He denies any chest pain.  He denies any shortness of breath.  He denies any dyspnea on exertion.  He denies any polyuria, polydipsia, or blurry vision.  He denies any neuropathy in his feet. Lab on 08/06/2017  Component Date Value Ref Range Status  . Cholesterol 08/06/2017 171  <200 mg/dL Final  . HDL 08/06/2017 48  >40 mg/dL Final  . Triglycerides 08/06/2017 231* <150 mg/dL Final   Comment: . If a non-fasting specimen was collected, consider repeat triglyceride testing on a fasting specimen if clinically indicated.  Yates Decamp et al. J. of Clin. Lipidol. 9211;9:417-408. .   . LDL Cholesterol (Calc) 08/06/2017 91  mg/dL (calc) Final   Comment: Reference range: <100 . Desirable range <100 mg/dL for primary prevention;   <70 mg/dL for patients with CHD or diabetic patients  with > or = 2 CHD risk factors. Marland Kitchen LDL-C is now calculated using the Martin-Hopkins  calculation, which is a validated novel method providing  better accuracy than the Friedewald equation in the  estimation of LDL-C.  Cresenciano Genre et al. Annamaria Helling. 1448;185(63): 2061-2068  (http://education.[facility].com/faq/FAQ164)   . Total CHOL/HDL Ratio 08/06/2017 3.6  <5.0 (calc) Final  . Non-HDL Cholesterol (Calc) 08/06/2017 123  <130 mg/dL  (calc) Final   Comment: For patients with diabetes plus 1 major ASCVD risk  factor, treating to a non-HDL-C goal of <100 mg/dL  (LDL-C of <70 mg/dL) is considered a therapeutic  option.   . WBC 08/06/2017 9.4  3.8 - 10.8 Thousand/uL Final  . RBC 08/06/2017 5.95* 4.20 - 5.80 Million/uL Final  . Hemoglobin 08/06/2017 18.3* 13.2 - 17.1 g/dL Final  . HCT 08/06/2017 52.9* 38.5 - 50.0 % Final  . MCV 08/06/2017 88.9  80.0 - 100.0 fL Final  . MCH 08/06/2017 30.8  27.0 - 33.0 pg Final  . MCHC 08/06/2017 34.6  32.0 - 36.0 g/dL Final  . RDW 08/06/2017 13.5  11.0 - 15.0 % Final  . Platelets 08/06/2017 199  140 - 400 Thousand/uL Final  . MPV 08/06/2017 9.9  7.5 - 12.5 fL Final  . Neutro Abs 08/06/2017 6,975  1,500 - 7,800 cells/uL Final  . Lymphs Abs 08/06/2017 1,523  850 - 3,900 cells/uL Final  . WBC mixed population 08/06/2017 573  200 - 950 cells/uL Final  . Eosinophils Absolute 08/06/2017 254  15 - 500 cells/uL Final  . Basophils Absolute 08/06/2017 75  0 - 200 cells/uL Final  . Neutrophils Relative % 08/06/2017 74.2  % Final  . Total Lymphocyte 08/06/2017 16.2  % Final  . Monocytes Relative 08/06/2017 6.1  % Final  . Eosinophils Relative 08/06/2017 2.7  % Final  . Basophils Relative 08/06/2017 0.8  % Final  . Hgb A1c MFr Bld 08/06/2017 6.8* <5.7 %  of total Hgb Final   Comment: For someone without known diabetes, a hemoglobin A1c value of 6.5% or greater indicates that they may have  diabetes and this should be confirmed with a follow-up  test. . For someone with known diabetes, a value <7% indicates  that their diabetes is well controlled and a value  greater than or equal to 7% indicates suboptimal  control. A1c targets should be individualized based on  duration of diabetes, age, comorbid conditions, and  other considerations. . Currently, no consensus exists regarding use of hemoglobin A1c for diagnosis of diabetes for children. .   . Mean Plasma Glucose 08/06/2017 148  (calc)  Final  . eAG (mmol/L) 08/06/2017 8.2  (calc) Final  . Glucose, Bld 08/06/2017 129* 65 - 99 mg/dL Final   Comment: .            Fasting reference interval . For someone without known diabetes, a glucose value >125 mg/dL indicates that they may have diabetes and this should be confirmed with a follow-up test. .   . BUN 08/06/2017 23  7 - 25 mg/dL Final  . Creat 08/06/2017 1.11  0.70 - 1.25 mg/dL Final   Comment: For patients >41 years of age, the reference limit for Creatinine is approximately 13% higher for people identified as African-American. .   Havery Moros Ratio 50/35/4656 NOT APPLICABLE  6 - 22 (calc) Final  . Sodium 08/06/2017 143  135 - 146 mmol/L Final  . Potassium 08/06/2017 4.4  3.5 - 5.3 mmol/L Final  . Chloride 08/06/2017 97* 98 - 110 mmol/L Final  . CO2 08/06/2017 30  20 - 32 mmol/L Final  . Calcium 08/06/2017 9.6  8.6 - 10.3 mg/dL Final  . Total Protein 08/06/2017 6.9  6.1 - 8.1 g/dL Final  . Albumin 08/06/2017 4.5  3.6 - 5.1 g/dL Final  . Globulin 08/06/2017 2.4  1.9 - 3.7 g/dL (calc) Final  . AG Ratio 08/06/2017 1.9  1.0 - 2.5 (calc) Final  . Total Bilirubin 08/06/2017 0.7  0.2 - 1.2 mg/dL Final  . Alkaline phosphatase (APISO) 08/06/2017 111  40 - 115 U/L Final  . AST 08/06/2017 20  10 - 35 U/L Final  . ALT 08/06/2017 24  9 - 46 U/L Final    Past Medical History:  Diagnosis Date  . Chronic kidney disease    has one functional kidney  . Diabetes mellitus    prediabetes  . Hyperlipidemia   . Hypertension   . Polycythemia, secondary 01/25/2014  . Tobacco abuse   . Ulcer    perforated duodenal ulcer 12/12   Past Surgical History:  Procedure Laterality Date  . CYSTECTOMY  under tongue  . LAPAROTOMY  12/16/2010   [name] patch perf du Procedure: EXPLORATORY LAPAROTOMY;  Surgeon: [name], MD;  Location: [facility];  Service: General;  Laterality: N/A;   Current Outpatient Medications on File Prior to Visit  Medication Sig Dispense Refill  .  cloNIDine (CATAPRES) 0.3 MG tablet TAKE 1 TABLET BY MOUTH TWICE DAILY 180 tablet 0  . cloNIDine (CATAPRES) 0.3 MG tablet TAKE 1 TABLET BY MOUTH TWICE DAILY 60 tablet 2  . Fish Oil OIL by Does not apply route. 2 po qhs    . furosemide (LASIX) 80 MG tablet TAKE 1 TABLET BY MOUTH EVERY DAY 90 tablet 0  . metoprolol tartrate (LOPRESSOR) 100 MG tablet TAKE 1 TABLET BY MOUTH TWICE DAILY 180 tablet 3  . Multiple Vitamins-Minerals (MULTIVITAMINS THER. W/MINERALS) TABS Take 1  tablet by mouth daily. 30 each   . omeprazole (PRILOSEC) 40 MG capsule TAKE 1 CAPSULE(40 MG) BY MOUTH DAILY 90 capsule 3  . pioglitazone (ACTOS) 15 MG tablet TAKE 1 TABLET BY MOUTH DAILY 90 tablet 0  . simvastatin (ZOCOR) 40 MG tablet TAKE 1 TABLET BY MOUTH EVERY EVENING 90 tablet 1  . traMADol (ULTRAM) 50 MG tablet Take 2 tablets (100 mg total) by mouth 4 (four) times daily. 60 tablet 0   No current facility-administered medications on file prior to visit.    No Known Allergies Social History   Socioeconomic History  . Marital status: Divorced    Spouse name: Not on file  . Number of children: Not on file  . Years of education: Not on file  . Highest education level: Not on file  Occupational History  . Not on file  Social Needs  . Financial resource strain: Not on file  . Food insecurity:    Worry: Not on file    Inability: Not on file  . Transportation needs:    Medical: Not on file    Non-medical: Not on file  Tobacco Use  . Smoking status: Current Every Day Smoker    Packs/day: 1.00    Types: Cigarettes  . Smokeless tobacco: Never Used  Substance and Sexual Activity  . Alcohol use: Yes    Comment: daily  . Drug use: No  . Sexual activity: Not on file  Lifestyle  . Physical activity:    Days per week: Not on file    Minutes per session: Not on file  . Stress: Not on file  Relationships  . Social connections:    Talks on phone: Not on file    Gets together: Not on file    Attends religious service:  Not on file    Active member of club or organization: Not on file    Attends meetings of clubs or organizations: Not on file    Relationship status: Not on file  . Intimate partner violence:    Fear of current or ex partner: Not on file    Emotionally abused: Not on file    Physically abused: Not on file    Forced sexual activity: Not on file  Other Topics Concern  . Not on file  Social History Narrative  . Not on file      Review of Systems  All other systems reviewed and are negative.      Objective:   Physical Exam  Constitutional: He is oriented to person, place, and time. He appears well-developed and well-nourished.  Neck: No JVD present. No thyromegaly present.  Cardiovascular: Normal rate, regular rhythm and normal heart sounds. Exam reveals no gallop and no friction rub.  No murmur heard. Pulmonary/Chest: Effort normal and breath sounds normal. No respiratory distress. He has no wheezes. He has no rales. He exhibits no tenderness.  Abdominal: Soft. Bowel sounds are normal. He exhibits no distension and no mass. There is no tenderness. There is no rebound and no guarding.  Musculoskeletal: He exhibits no edema.  Neurological: He is alert and oriented to person, place, and time. He exhibits normal muscle tone.  Skin: No rash noted.  Vitals reviewed.         Assessment & Plan:  Controlled type 2 diabetes mellitus with complication, without long-term current use of insulin (HCC)  Pure hypercholesterolemia  Essential hypertension  Polycythemia  Smoking  Chronic kidney disease, unspecified CKD stage I am very happy with  his blood pressure area I am also satisfied with his excellent hemoglobin A1c of 6.8.  His LDL cholesterol is acceptable.  Patient has independently discontinued his Prilosec due to constipation.  He declines any other proton pump inhibitor despite his history of peptic ulcers.  We discussed his aspirin use.  Certainly I am concerned about GI  bleed on aspirin without him taking a proton pump inhibitor.  However based on his risk factors including diabetes, smoking, hypertension, hyperlipidemia and now his concerning findings on his diabetic foot exam for possible peripheral vascular disease I would like him to continue aspirin 81 mg a day.  Continue to strongly urged him to have a diabetic eye exam and a colonoscopy which he refuses.

## 2017-09-06 ENCOUNTER — Other Ambulatory Visit: Payer: Self-pay | Admitting: Family Medicine

## 2017-09-08 ENCOUNTER — Other Ambulatory Visit: Payer: Self-pay | Admitting: Family Medicine

## 2017-09-23 ENCOUNTER — Telehealth: Payer: Self-pay | Admitting: Family Medicine

## 2017-09-23 NOTE — Telephone Encounter (Signed)
Pt came by and dropped of jury duty form wants note to not have to do jury duty please fax when complete and make copy for patient. Placed in yellow folder.

## 2017-09-26 NOTE — Telephone Encounter (Signed)
Routed to Dr. Dennard Schaumann placed in blue folder

## 2017-10-02 ENCOUNTER — Encounter: Payer: Self-pay | Admitting: Family Medicine

## 2017-10-07 NOTE — Telephone Encounter (Signed)
Form completed and faxed to [employer] for jury duty dismissal

## 2017-10-08 ENCOUNTER — Other Ambulatory Visit: Payer: Self-pay | Admitting: Family Medicine

## 2017-11-13 ENCOUNTER — Ambulatory Visit (INDEPENDENT_AMBULATORY_CARE_PROVIDER_SITE_OTHER): Payer: Medicare Other

## 2017-11-13 DIAGNOSIS — Z23 Encounter for immunization: Secondary | ICD-10-CM

## 2017-11-13 NOTE — Progress Notes (Signed)
Patient came in today to receive annual flu vaccination. He received influenza in the left deltoid. He tolerated well.VIS was given.

## 2017-12-07 ENCOUNTER — Other Ambulatory Visit: Payer: Self-pay | Admitting: Family Medicine

## 2018-02-04 ENCOUNTER — Other Ambulatory Visit: Payer: Self-pay | Admitting: Family Medicine

## 2018-02-15 ENCOUNTER — Other Ambulatory Visit: Payer: Self-pay | Admitting: Family Medicine

## 2018-05-13 ENCOUNTER — Other Ambulatory Visit: Payer: Self-pay | Admitting: Family Medicine

## 2018-05-13 MED ORDER — TRAMADOL HCL 50 MG PO TABS: 100.0000 mg | ORAL_TABLET | Freq: Four times a day (QID) | ORAL | 0 refills | Status: DC

## 2018-05-13 NOTE — Telephone Encounter (Signed)
[facility] [place]  Patient would like refill on his tramadol for some back pain he is having

## 2018-05-13 NOTE — Telephone Encounter (Signed)
Requesting refill    Tramadol  LOV: 08/09/17  LRF:  06/21/16

## 2018-06-17 ENCOUNTER — Other Ambulatory Visit: Payer: Self-pay | Admitting: Family Medicine

## 2018-06-26 ENCOUNTER — Other Ambulatory Visit: Payer: Self-pay | Admitting: Family Medicine

## 2018-06-26 ENCOUNTER — Telehealth: Payer: Self-pay | Admitting: Family Medicine

## 2018-06-26 DIAGNOSIS — E118 Type 2 diabetes mellitus with unspecified complications: Secondary | ICD-10-CM

## 2018-06-26 DIAGNOSIS — I10 Essential (primary) hypertension: Secondary | ICD-10-CM

## 2018-06-26 DIAGNOSIS — E7800 Pure hypercholesterolemia, unspecified: Secondary | ICD-10-CM

## 2018-06-26 DIAGNOSIS — N189 Chronic kidney disease, unspecified: Secondary | ICD-10-CM

## 2018-06-26 MED ORDER — PREDNISONE 20 MG PO TABS: ORAL_TABLET | ORAL | 0 refills | Status: DC

## 2018-06-26 NOTE — Telephone Encounter (Signed)
Ntbs, prednisone taper pack

## 2018-06-26 NOTE — Telephone Encounter (Signed)
Med sent to pharm and pt aware 

## 2018-06-26 NOTE — Telephone Encounter (Signed)
Pt thinks he is having sciatica with back pain and would like to know if we can call something in for it?

## 2018-06-27 ENCOUNTER — Other Ambulatory Visit: Payer: Self-pay | Admitting: Family Medicine

## 2018-06-27 MED ORDER — PREDNISONE 20 MG PO TABS: ORAL_TABLET | ORAL | 0 refills | Status: DC

## 2018-07-03 ENCOUNTER — Ambulatory Visit: Payer: Medicare Other | Admitting: Family Medicine

## 2018-07-21 ENCOUNTER — Encounter: Payer: Self-pay | Admitting: Family Medicine

## 2018-07-21 ENCOUNTER — Other Ambulatory Visit: Payer: Self-pay

## 2018-07-21 ENCOUNTER — Ambulatory Visit (INDEPENDENT_AMBULATORY_CARE_PROVIDER_SITE_OTHER): Payer: Medicare Other | Admitting: Family Medicine

## 2018-07-21 VITALS — BP 154/90 | HR 88 | Temp 97.9°F | Resp 20 | Ht 66.0 in | Wt 229.0 lb

## 2018-07-21 DIAGNOSIS — N189 Chronic kidney disease, unspecified: Secondary | ICD-10-CM

## 2018-07-21 DIAGNOSIS — I10 Essential (primary) hypertension: Secondary | ICD-10-CM | POA: Diagnosis not present

## 2018-07-21 DIAGNOSIS — IMO0001 Reserved for inherently not codable concepts without codable children: Secondary | ICD-10-CM

## 2018-07-21 DIAGNOSIS — F17200 Nicotine dependence, unspecified, uncomplicated: Secondary | ICD-10-CM

## 2018-07-21 DIAGNOSIS — Z125 Encounter for screening for malignant neoplasm of prostate: Secondary | ICD-10-CM

## 2018-07-21 DIAGNOSIS — E118 Type 2 diabetes mellitus with unspecified complications: Secondary | ICD-10-CM | POA: Diagnosis not present

## 2018-07-21 DIAGNOSIS — D751 Secondary polycythemia: Secondary | ICD-10-CM | POA: Diagnosis not present

## 2018-07-21 DIAGNOSIS — E7800 Pure hypercholesterolemia, unspecified: Secondary | ICD-10-CM | POA: Diagnosis not present

## 2018-07-21 MED ORDER — OXYCODONE-ACETAMINOPHEN 7.5-325 MG PO TABS: 1.0000 | ORAL_TABLET | ORAL | 0 refills | Status: DC | PRN

## 2018-07-21 MED ORDER — ALBUTEROL SULFATE HFA 108 (90 BASE) MCG/ACT IN AERS: 2.0000 | INHALATION_SPRAY | Freq: Four times a day (QID) | RESPIRATORY_TRACT | 5 refills | Status: DC | PRN

## 2018-07-21 MED ORDER — LOSARTAN POTASSIUM 50 MG PO TABS: 50.0000 mg | ORAL_TABLET | Freq: Every day | ORAL | 3 refills | Status: DC

## 2018-07-21 NOTE — Progress Notes (Signed)
Subjective:    Patient ID: Harry James, male    DOB: January 02, 1951, 68 y.o.   MRN: [id number]  Medication Refill    Patient is here today for follow-up of his diabetes mellitus type 2, his hypertension, his hyperlipidemia, his chronic kidney disease, and has secondary polycythemia due to smoking and COPD. Patient continues to smoke.  He smokes 1 pack of cigarettes per day.  He has no desire to quit.  His blood pressure is elevated today.  He is taking clonidine and metoprolol.  He denies any chest pain.  He denies any shortness of breath.  He is audibly wheezing today on exam.  He has diminished breath sounds and rhonchorous breath sounds consistent with emphysema.  We discussed using albuterol as needed for wheezing and shortness of breath and he is interested in trying that.  However he is pre-contemplative regarding smoking cessation.  He is overdue for colonoscopy.  He refuses this but he will consider Cologuard now.  He is also overdue for prostate cancer screening.  He denies any polyuria polydipsia or blurry vision.  He did recently have sciatica radiating from his left lower back into his posterior left hip and down his left leg.  He reports burning stinging pain radiating into his left hip and down his left leg.  This is gradually improved.  He was taking Aleve for it.  I counseled him not to do that given his history of chronic kidney disease and a perforated duodenal ulcer.  I gave him a prescription today for Percocet to be used sparingly as needed for sciatica back pain.  This seems to have improved over the last few weeks spontaneously on its own.  Past Medical History:  Diagnosis Date  . Chronic kidney disease    has one functional kidney  . Diabetes mellitus    prediabetes  . Hyperlipidemia   . Hypertension   . Polycythemia, secondary 01/25/2014  . Tobacco abuse   . Ulcer    perforated duodenal ulcer 12/12   Past Surgical History:  Procedure Laterality Date  . CYSTECTOMY   under tongue  . LAPAROTOMY  12/16/2010   [name] patch perf du Procedure: EXPLORATORY LAPAROTOMY;  Surgeon: [name], MD;  Location: [facility];  Service: General;  Laterality: N/A;   Current Outpatient Medications on File Prior to Visit  Medication Sig Dispense Refill  . cloNIDine (CATAPRES) 0.3 MG tablet TAKE 1 TABLET BY MOUTH TWICE DAILY 180 tablet 0  . cloNIDine (CATAPRES) 0.3 MG tablet TAKE 1 TABLET BY MOUTH TWICE DAILY 180 tablet 2  . Fish Oil OIL by Does not apply route. 2 po qhs    . furosemide (LASIX) 80 MG tablet TAKE 1 TABLET BY MOUTH EVERY DAY 90 tablet 3  . metoprolol tartrate (LOPRESSOR) 100 MG tablet TAKE 1 TABLET BY MOUTH TWICE DAILY 180 tablet 3  . Multiple Vitamins-Minerals (MULTIVITAMINS THER. W/MINERALS) TABS Take 1 tablet by mouth daily. 30 each   . omeprazole (PRILOSEC) 40 MG capsule TAKE 1 CAPSULE(40 MG) BY MOUTH DAILY 90 capsule 3  . pioglitazone (ACTOS) 15 MG tablet TAKE 1 TABLET BY MOUTH DAILY 90 tablet 1  . simvastatin (ZOCOR) 40 MG tablet TAKE 1 TABLET BY MOUTH EVERY EVENING 90 tablet 1  . traMADol (ULTRAM) 50 MG tablet Take 2 tablets (100 mg total) by mouth 4 (four) times daily. 60 tablet 0   No current facility-administered medications on file prior to visit.    No Known Allergies Social History  Socioeconomic History  . Marital status: Divorced    Spouse name: Not on file  . Number of children: Not on file  . Years of education: Not on file  . Highest education level: Not on file  Occupational History  . Not on file  Social Needs  . Financial resource strain: Not on file  . Food insecurity    Worry: Not on file    Inability: Not on file  . Transportation needs    Medical: Not on file    Non-medical: Not on file  Tobacco Use  . Smoking status: Current Every Day Smoker    Packs/day: 1.00    Types: Cigarettes  . Smokeless tobacco: Never Used  Substance and Sexual Activity  . Alcohol use: Yes    Comment: daily  . Drug use: No  . Sexual  activity: Not on file  Lifestyle  . Physical activity    Days per week: Not on file    Minutes per session: Not on file  . Stress: Not on file  Relationships  . Social Herbalist on phone: Not on file    Gets together: Not on file    Attends religious service: Not on file    Active member of club or organization: Not on file    Attends meetings of clubs or organizations: Not on file    Relationship status: Not on file  . Intimate partner violence    Fear of current or ex partner: Not on file    Emotionally abused: Not on file    Physically abused: Not on file    Forced sexual activity: Not on file  Other Topics Concern  . Not on file  Social History Narrative  . Not on file      Review of Systems  All other systems reviewed and are negative.      Objective:   Physical Exam  Constitutional: He is oriented to person, place, and time. He appears well-developed and well-nourished.  Neck: No JVD present. No thyromegaly present.  Cardiovascular: Normal rate, regular rhythm and normal heart sounds. Exam reveals no gallop and no friction rub.  No murmur heard. Pulmonary/Chest: Effort normal. No respiratory distress. He has wheezes. He has no rales. He exhibits no tenderness.  Abdominal: Soft. Bowel sounds are normal. He exhibits no distension and no mass. There is no abdominal tenderness. There is no rebound and no guarding.  Musculoskeletal:        General: No edema.  Neurological: He is alert and oriented to person, place, and time. He exhibits normal muscle tone.  Skin: No rash noted.  Vitals reviewed.         Assessment & Plan:  The primary encounter diagnosis was Controlled type 2 diabetes mellitus with complication, without long-term current use of insulin ([facility]). Diagnoses of Pure hypercholesterolemia, Essential hypertension, Chronic kidney disease, unspecified CKD stage, Polycythemia, Smoking, and Prostate cancer screening were also pertinent to this  visit. Given his history of diabetes, I will check a hemoglobin A1c.  Goal hemoglobin A1c is less than 6.5.  Given his history of chronic kidney disease I will check a BMP to monitor his renal function.  I recommended he avoid NSAIDs due to his chronic kidney disease as well as his history of a perforated duodenal ulcer.  I will treat his sciatica supportively with Percocet 7.5/325 1 p.o. every 6 hours as needed pain.  I will give him 30 tablets.  The pain has improved dramatically  and seems to be resolving on its own.  I will check his cholesterol.  Given his history of smoking and diabetes would like his LDL cholesterol to be below 100.  His blood pressure today is elevated and I will add losartan 50 mg a day and reassess his blood pressure in 1 month.  I will screen for prostate cancer with PSA.  I will schedule the patient for Cologuard to screen for colon cancer.  I recommended smoking cessation.  He can use albuterol 2 puffs inhaled every 6 hours as needed for wheezing and shortness of breath

## 2018-07-22 ENCOUNTER — Telehealth: Payer: Self-pay | Admitting: *Deleted

## 2018-07-22 LAB — CBC WITH DIFFERENTIAL/PLATELET
Absolute Monocytes: 446 cells/uL (ref 200–950)
Basophils Absolute: 78 cells/uL (ref 0–200)
Basophils Relative: 0.8 %
Eosinophils Absolute: 281 cells/uL (ref 15–500)
Eosinophils Relative: 2.9 %
HCT: 52.3 % — ABNORMAL HIGH (ref 38.5–50.0)
Hemoglobin: 17.7 g/dL — ABNORMAL HIGH (ref 13.2–17.1)
Lymphs Abs: 1397 cells/uL (ref 850–3900)
MCH: 30.8 pg (ref 27.0–33.0)
MCHC: 33.8 g/dL (ref 32.0–36.0)
MCV: 91 fL (ref 80.0–100.0)
MPV: 10.1 fL (ref 7.5–12.5)
Monocytes Relative: 4.6 %
Neutro Abs: 7498 cells/uL (ref 1500–7800)
Neutrophils Relative %: 77.3 %
Platelets: 180 10*3/uL (ref 140–400)
RBC: 5.75 10*6/uL (ref 4.20–5.80)
RDW: 13.8 % (ref 11.0–15.0)
Total Lymphocyte: 14.4 %
WBC: 9.7 10*3/uL (ref 3.8–10.8)

## 2018-07-22 LAB — COMPLETE METABOLIC PANEL WITH GFR
AG Ratio: 1.9 (calc) (ref 1.0–2.5)
ALT: 19 U/L (ref 9–46)
AST: 16 U/L (ref 10–35)
Albumin: 4.3 g/dL (ref 3.6–5.1)
Alkaline phosphatase (APISO): 99 U/L (ref 35–144)
BUN/Creatinine Ratio: 26 (calc) — ABNORMAL HIGH (ref 6–22)
BUN: 31 mg/dL — ABNORMAL HIGH (ref 7–25)
CO2: 26 mmol/L (ref 20–32)
Calcium: 9.4 mg/dL (ref 8.6–10.3)
Chloride: 102 mmol/L (ref 98–110)
Creat: 1.21 mg/dL (ref 0.70–1.25)
GFR, Est African American: 70 mL/min/{1.73_m2} (ref >=60)
GFR, Est Non African American: 61 mL/min/{1.73_m2} (ref >=60)
Globulin: 2.3 g/dL (calc) (ref 1.9–3.7)
Glucose, Bld: 142 mg/dL — ABNORMAL HIGH (ref 65–99)
Potassium: 4.5 mmol/L (ref 3.5–5.3)
Sodium: 141 mmol/L (ref 135–146)
Total Bilirubin: 0.7 mg/dL (ref 0.2–1.2)
Total Protein: 6.6 g/dL (ref 6.1–8.1)

## 2018-07-22 LAB — LIPID PANEL
Cholesterol: 182 mg/dL (ref <200)
HDL: 56 mg/dL (ref >=40)
LDL Cholesterol (Calc): 98 mg/dL (calc)
Non-HDL Cholesterol (Calc): 126 mg/dL (calc) (ref <130)
Total CHOL/HDL Ratio: 3.3 (calc) (ref <5.0)
Triglycerides: 189 mg/dL — ABNORMAL HIGH (ref <150)

## 2018-07-22 LAB — PSA: PSA: 2.4 ng/mL (ref <=4.0)

## 2018-07-22 LAB — HEMOGLOBIN A1C
Hgb A1c MFr Bld: 6.9 % of total Hgb — ABNORMAL HIGH (ref <5.7)
Mean Plasma Glucose: 151 (calc)
eAG (mmol/L): 8.4 (calc)

## 2018-07-22 LAB — MICROALBUMIN, URINE: Microalb, Ur: 30 mg/dL

## 2018-07-22 NOTE — Telephone Encounter (Signed)
-----   Message from Alyson Locket, [place] sent at 07/21/2018  9:32 AM EDT -----  ----- Message ----- From: Susy Frizzle, MD Sent: 07/21/2018   9:14 AM EDT To: Alyson Locket, RMA  Schedule patient for cologuard please

## 2018-07-22 NOTE — Telephone Encounter (Signed)
Received verbal orders for Cologuard.   Order placed via [facility].   Cologuard (Order [id number])

## 2018-08-16 ENCOUNTER — Other Ambulatory Visit: Payer: Self-pay | Admitting: Family Medicine

## 2018-09-16 ENCOUNTER — Other Ambulatory Visit: Payer: Self-pay

## 2018-09-16 ENCOUNTER — Other Ambulatory Visit: Payer: Self-pay | Admitting: Family Medicine

## 2018-09-16 NOTE — Patient Outreach (Signed)
[facility]) Care Management  09/16/2018  Harry James Feb 14, 1950 [id number]   Medication Adherence call to [name] Verify spoke with patient he is past due on Simvastatin 40 mg patient explain he takes one tablet daily and has enough for another week and half patient will order when due. [name] is showing past due under [place].  [facility] Management Direct Dial [phone]  Fax [phone] [email or web address]

## 2018-09-29 ENCOUNTER — Other Ambulatory Visit: Payer: Self-pay | Admitting: Family Medicine

## 2018-10-16 ENCOUNTER — Other Ambulatory Visit: Payer: Self-pay | Admitting: Family Medicine

## 2018-12-23 ENCOUNTER — Other Ambulatory Visit: Payer: Self-pay | Admitting: Family Medicine

## 2018-12-23 MED ORDER — PIOGLITAZONE HCL 15 MG PO TABS: 15.0000 mg | ORAL_TABLET | Freq: Every day | ORAL | 1 refills | Status: DC

## 2019-03-20 ENCOUNTER — Other Ambulatory Visit: Payer: Self-pay | Admitting: Family Medicine

## 2019-03-23 ENCOUNTER — Other Ambulatory Visit: Payer: Self-pay | Admitting: Family Medicine

## 2019-03-24 ENCOUNTER — Other Ambulatory Visit: Payer: Self-pay | Admitting: Family Medicine

## 2019-03-24 NOTE — Telephone Encounter (Signed)
Requested Prescriptions   Pending Prescriptions Disp Refills  . traMADol (ULTRAM) 50 MG tablet [Pharmacy Med Name: TRAMADOL 50MG  TABLETS] 60 tablet     Sig: TAKE 2 TABLETS(100 MG) BY MOUTH FOUR TIMES DAILY    Last OV 07/21/2018   Last written 05/13/2018

## 2019-06-23 ENCOUNTER — Other Ambulatory Visit: Payer: Self-pay | Admitting: Family Medicine

## 2019-07-23 ENCOUNTER — Other Ambulatory Visit: Payer: Self-pay | Admitting: Family Medicine

## 2019-07-23 NOTE — Telephone Encounter (Signed)
Needs to make apt

## 2019-07-27 ENCOUNTER — Other Ambulatory Visit: Payer: Self-pay

## 2019-07-27 ENCOUNTER — Ambulatory Visit (INDEPENDENT_AMBULATORY_CARE_PROVIDER_SITE_OTHER): Payer: Medicare Other | Admitting: Family Medicine

## 2019-07-27 VITALS — BP 140/80 | HR 91 | Temp 97.3°F | Wt 234.0 lb

## 2019-07-27 DIAGNOSIS — Z0000 Encounter for general adult medical examination without abnormal findings: Secondary | ICD-10-CM

## 2019-07-27 DIAGNOSIS — F1011 Alcohol abuse, in remission: Secondary | ICD-10-CM

## 2019-07-27 DIAGNOSIS — I10 Essential (primary) hypertension: Secondary | ICD-10-CM

## 2019-07-27 DIAGNOSIS — F17200 Nicotine dependence, unspecified, uncomplicated: Secondary | ICD-10-CM

## 2019-07-27 DIAGNOSIS — E7800 Pure hypercholesterolemia, unspecified: Secondary | ICD-10-CM

## 2019-07-27 DIAGNOSIS — N183 Chronic kidney disease, stage 3 unspecified: Secondary | ICD-10-CM

## 2019-07-27 DIAGNOSIS — Z0001 Encounter for general adult medical examination with abnormal findings: Secondary | ICD-10-CM | POA: Diagnosis not present

## 2019-07-27 DIAGNOSIS — E118 Type 2 diabetes mellitus with unspecified complications: Secondary | ICD-10-CM | POA: Diagnosis not present

## 2019-07-27 DIAGNOSIS — Z125 Encounter for screening for malignant neoplasm of prostate: Secondary | ICD-10-CM

## 2019-07-27 MED ORDER — TRELEGY ELLIPTA 100-62.5-25 MCG/INH IN AEPB: 1.0000 | INHALATION_SPRAY | Freq: Every day | RESPIRATORY_TRACT | 3 refills | Status: DC

## 2019-07-27 NOTE — Progress Notes (Signed)
Subjective:    Patient ID: Harry James, male    DOB: 1951/01/07, 69 y.o.   MRN: [id number]  Patient presents today for complete physical exam.  Patient is a 69 year old Caucasian male with a history of diabetes, hyperlipidemia, hypertension, tobacco abuse, chronic kidney disease, alcohol abuse, and history of GI bleed.  He is due for lung cancer screening.  We discussed this today.  He declines a CT scan of the lungs to screen for lung cancer.  He had a CT scan of the abdomen in 2012 which did show a 3.9 cm abdominal aortic aneurysm.  He is long overdue to reschedule and rescreen this.  I discussed and recommended an ultrasound to evaluate for AAA.  Patient declines this.  He is overdue for colonoscopy.  He declines this.  He does have Cologuard at his home but he has not mailed it in yet.  He is due for prostate cancer screening and he is willing to allow me to check a PSA today.  He declines HIV and hepatitis C screening.  He continues to smoke a half a pack of cigarettes to a pack of cigarettes per day.  He is due for COVID-19 vaccination however he has not gone to get it yet.  He is due for a booster on Pneumovax 23.  He is also due for the shingles vaccine.  He denies any depression or falls or memory loss.  He is not checking his blood sugar.  He denies any chest pain shortness of breath or dyspnea on exertion.  He denies any neuropathy in his feet.  He denies any claudication however on diabetic foot exam today, his toes are a dusky pink color distal to the MTP joints.  They are cool to the touch.  He has a very weak dorsalis pedis in the left foot and an absent dorsalis pedis in the right foot.  Hair growth is absent from the dorsum of the foot.  He has a delayed capillary refill all suggesting diminished circulation to his feet.  He denies any claudication and declines an ABI to evaluate  Past Medical History:  Diagnosis Date  . Chronic kidney disease    has one functional kidney  . Diabetes  mellitus    prediabetes  . Hyperlipidemia   . Hypertension   . Polycythemia, secondary 01/25/2014  . Tobacco abuse   . Ulcer    perforated duodenal ulcer 12/12   Past Surgical History:  Procedure Laterality Date  . CYSTECTOMY  under tongue  . LAPAROTOMY  12/16/2010   [name] patch perf du Procedure: EXPLORATORY LAPAROTOMY;  Surgeon: [name], MD;  Location: [facility];  Service: General;  Laterality: N/A;   Current Outpatient Medications on File Prior to Visit  Medication Sig Dispense Refill  . cloNIDine (CATAPRES) 0.3 MG tablet TAKE 1 TABLET BY MOUTH TWICE DAILY 180 tablet 2  . Fish Oil OIL by Does not apply route. 2 po qhs    . furosemide (LASIX) 80 MG tablet TAKE 1 TABLET BY MOUTH EVERY DAY 90 tablet 3  . losartan (COZAAR) 50 MG tablet TAKE 1 TABLET(50 MG) BY MOUTH DAILY 20 tablet 0  . metoprolol tartrate (LOPRESSOR) 100 MG tablet TAKE 1 TABLET BY MOUTH TWICE DAILY 180 tablet 3  . Multiple Vitamins-Minerals (MULTIVITAMINS THER. W/MINERALS) TABS Take 1 tablet by mouth daily. 30 each   . omeprazole (PRILOSEC) 40 MG capsule TAKE 1 CAPSULE(40 MG) BY MOUTH DAILY 90 capsule 3  . oxyCODONE-acetaminophen (PERCOCET) 7.5-325 MG  tablet Take 1 tablet by mouth every 4 (four) hours as needed for severe pain. 30 tablet 0  . pioglitazone (ACTOS) 15 MG tablet TAKE 1 TABLET(15 MG) BY MOUTH DAILY 90 tablet 0  . PROAIR HFA 108 (90 Base) MCG/ACT inhaler INHALE 2 PUFFS INTO THE LUNGS EVERY 6 HOURS AS NEEDED FOR WHEEZING OR SHORTNESS OF BREATH 8.5 g 2  . simvastatin (ZOCOR) 40 MG tablet TAKE 1 TABLET BY MOUTH EVERY EVENING 90 tablet 1  . traMADol (ULTRAM) 50 MG tablet TAKE 2 TABLETS(100 MG) BY MOUTH FOUR TIMES DAILY 60 tablet 0   No current facility-administered medications on file prior to visit.   No Known Allergies Social History   Socioeconomic History  . Marital status: Divorced    Spouse name: Not on file  . Number of children: Not on file  . Years of education: Not on file  . Highest  education level: Not on file  Occupational History  . Not on file  Tobacco Use  . Smoking status: Current Every Day Smoker    Packs/day: 1.00    Types: Cigarettes  . Smokeless tobacco: Never Used  Substance and Sexual Activity  . Alcohol use: Yes    Comment: daily  . Drug use: No  . Sexual activity: Not on file  Other Topics Concern  . Not on file  Social History Narrative  . Not on file   Social Determinants of Health   Financial Resource Strain:   . Difficulty of Paying Living Expenses:   Food Insecurity:   . Worried About Charity fundraiser in the Last Year:   . Arboriculturist in the Last Year:   Transportation Needs:   . Film/video editor (Medical):   Marland Kitchen Lack of Transportation (Non-Medical):   Physical Activity:   . Days of Exercise per Week:   . Minutes of Exercise per Session:   Stress:   . Feeling of Stress :   Social Connections:   . Frequency of Communication with Friends and Family:   . Frequency of Social Gatherings with Friends and Family:   . Attends Religious Services:   . Active Member of Clubs or Organizations:   . Attends Archivist Meetings:   Marland Kitchen Marital Status:   Intimate Partner Violence:   . Fear of Current or Ex-Partner:   . Emotionally Abused:   Marland Kitchen Physically Abused:   . Sexually Abused:    No family history on file.    Review of Systems  All other systems reviewed and are negative.      Objective:   Physical Exam Vitals reviewed.  Constitutional:      General: He is not in acute distress.    Appearance: He is well-developed. He is obese. He is not ill-appearing or toxic-appearing.  HENT:     Right Ear: Tympanic membrane and ear canal normal.     Left Ear: Tympanic membrane and ear canal normal.     Nose: Nose normal. No congestion or rhinorrhea.  Eyes:     Extraocular Movements: Extraocular movements intact.     Conjunctiva/sclera: Conjunctivae normal.     Pupils: Pupils are equal, round, and reactive to light.    Neck:     Thyroid: No thyromegaly.     Vascular: No JVD.  Cardiovascular:     Rate and Rhythm: Normal rate and regular rhythm.     Heart sounds: Normal heart sounds. No murmur heard.  No friction rub. No gallop.  Pulmonary:     Effort: Pulmonary effort is normal. No respiratory distress.     Breath sounds: Decreased air movement present. Decreased breath sounds and wheezing present. No rales.  Chest:     Chest wall: No tenderness.  Abdominal:     General: Bowel sounds are normal. There is no distension.     Palpations: Abdomen is soft. There is no mass.     Tenderness: There is no abdominal tenderness. There is no guarding or rebound.  Musculoskeletal:     Right lower leg: No edema.     Left lower leg: No edema.  Skin:    Findings: No erythema or rash.  Neurological:     General: No focal deficit present.     Mental Status: He is alert and oriented to person, place, and time.     Cranial Nerves: No cranial nerve deficit.     Sensory: No sensory deficit.     Motor: No weakness or abnormal muscle tone.     Gait: Gait normal.  Psychiatric:        Mood and Affect: Mood normal.        Behavior: Behavior normal.        Thought Content: Thought content normal.        Judgment: Judgment normal.           Assessment & Plan:  Controlled type 2 diabetes mellitus with complication, without long-term current use of insulin (HCC) - Plan: Hemoglobin A1c, CBC with Differential/Platelet, COMPLETE METABOLIC PANEL WITH GFR, Lipid panel, Microalbumin, urine  Pure hypercholesterolemia  Essential hypertension  Smoking  Prostate cancer screening - Plan: PSA  History of ETOH abuse  Stage 3 chronic kidney disease, unspecified whether stage 3a or 3b CKD  General medical exam I recommended a CT scan of the lungs to screen for lung cancer.  I recommended a colonoscopy or Cologuard, the patient has Cologuard at his home he has not returned to.  I recommend an ultrasound of the abdomen  to evaluate the AAA that he knows that he has dating back to 2012.  I recommended arterial Dopplers of the legs with an ABI to evaluate peripheral vascular disease.  Patient politely declines all of these test.  He understands the risk and benefits and chooses not to pursue any of this testing.  He denies any depression or memory loss or falls.  He does consent to a PSA.  He is in the precontemplative phase for smoking cessation and has no desire to quit at the present time.  Blood pressures acceptable at 140/80.  I will check a CBC, CMP, fasting lipid panel, urine microalbumin, and a hemoglobin A1c.  Goal hemoglobin A1c is less than 6.5.  Goal LDL cholesterol is less than 70 as a suspect the patient has peripheral vascular disease.

## 2019-07-28 ENCOUNTER — Other Ambulatory Visit: Payer: Self-pay

## 2019-07-28 LAB — LIPID PANEL
Cholesterol: 153 mg/dL (ref <200)
HDL: 52 mg/dL (ref >=40)
LDL Cholesterol (Calc): 76 mg/dL (calc)
Non-HDL Cholesterol (Calc): 101 mg/dL (calc) (ref <130)
Total CHOL/HDL Ratio: 2.9 (calc) (ref <5.0)
Triglycerides: 158 mg/dL — ABNORMAL HIGH (ref <150)

## 2019-07-28 LAB — CBC WITH DIFFERENTIAL/PLATELET
Absolute Monocytes: 447 cells/uL (ref 200–950)
Basophils Absolute: 60 cells/uL (ref 0–200)
Basophils Relative: 0.7 %
Eosinophils Absolute: 224 cells/uL (ref 15–500)
Eosinophils Relative: 2.6 %
HCT: 51.5 % — ABNORMAL HIGH (ref 38.5–50.0)
Hemoglobin: 16.9 g/dL (ref 13.2–17.1)
Lymphs Abs: 1170 cells/uL (ref 850–3900)
MCH: 31.1 pg (ref 27.0–33.0)
MCHC: 32.8 g/dL (ref 32.0–36.0)
MCV: 94.7 fL (ref 80.0–100.0)
MPV: 9.8 fL (ref 7.5–12.5)
Monocytes Relative: 5.2 %
Neutro Abs: 6699 cells/uL (ref 1500–7800)
Neutrophils Relative %: 77.9 %
Platelets: 182 10*3/uL (ref 140–400)
RBC: 5.44 10*6/uL (ref 4.20–5.80)
RDW: 12.6 % (ref 11.0–15.0)
Total Lymphocyte: 13.6 %
WBC: 8.6 10*3/uL (ref 3.8–10.8)

## 2019-07-28 LAB — HEMOGLOBIN A1C
Hgb A1c MFr Bld: 6.2 % of total Hgb — ABNORMAL HIGH (ref <5.7)
Mean Plasma Glucose: 131 (calc)
eAG (mmol/L): 7.3 (calc)

## 2019-07-28 LAB — COMPLETE METABOLIC PANEL WITH GFR
AG Ratio: 1.7 (calc) (ref 1.0–2.5)
ALT: 18 U/L (ref 9–46)
AST: 14 U/L (ref 10–35)
Albumin: 4.3 g/dL (ref 3.6–5.1)
Alkaline phosphatase (APISO): 125 U/L (ref 35–144)
BUN/Creatinine Ratio: 35 (calc) — ABNORMAL HIGH (ref 6–22)
BUN: 42 mg/dL — ABNORMAL HIGH (ref 7–25)
CO2: 23 mmol/L (ref 20–32)
Calcium: 9.5 mg/dL (ref 8.6–10.3)
Chloride: 104 mmol/L (ref 98–110)
Creat: 1.19 mg/dL — ABNORMAL HIGH (ref 0.70–1.18)
GFR, Est African American: 71 mL/min/{1.73_m2} (ref >=60)
GFR, Est Non African American: 62 mL/min/{1.73_m2} (ref >=60)
Globulin: 2.5 g/dL (calc) (ref 1.9–3.7)
Glucose, Bld: 127 mg/dL — ABNORMAL HIGH (ref 65–99)
Potassium: 4.4 mmol/L (ref 3.5–5.3)
Sodium: 138 mmol/L (ref 135–146)
Total Bilirubin: 0.6 mg/dL (ref 0.2–1.2)
Total Protein: 6.8 g/dL (ref 6.1–8.1)

## 2019-07-28 LAB — MICROALBUMIN, URINE: Microalb, Ur: 10 mg/dL

## 2019-07-28 LAB — PSA: PSA: 1.8 ng/mL (ref <=4.0)

## 2019-07-28 MED ORDER — TRELEGY ELLIPTA 100-62.5-25 MCG/INH IN AEPB: 1.0000 | INHALATION_SPRAY | Freq: Every day | RESPIRATORY_TRACT | 3 refills | Status: DC

## 2019-08-10 ENCOUNTER — Other Ambulatory Visit: Payer: Self-pay | Admitting: Nurse Practitioner

## 2019-09-05 ENCOUNTER — Other Ambulatory Visit: Payer: Self-pay | Admitting: Family Medicine

## 2019-09-19 ENCOUNTER — Other Ambulatory Visit: Payer: Self-pay | Admitting: Family Medicine

## 2019-11-27 ENCOUNTER — Telehealth: Payer: Self-pay | Admitting: Family Medicine

## 2019-11-27 NOTE — Progress Notes (Signed)
  Chronic Care Management   Outreach Note  11/27/2019 Name: Harry James MRN: [id number] DOB: [birth year]  Referred by: Susy Frizzle, MD Reason for referral : No chief complaint on file.   An unsuccessful telephone outreach was attempted today. The patient was referred to the pharmacist for assistance with care management and care coordination.   Follow Up Plan:   Carley Perdue [employer] Scheduler

## 2019-12-08 ENCOUNTER — Telehealth: Payer: Self-pay | Admitting: Family Medicine

## 2019-12-08 NOTE — Progress Notes (Signed)
  Chronic Care Management   Note  12/08/2019 Name: LOC FEINSTEIN MRN: [id number] DOB: 1950/04/22  JOUSHA SCHWANDT is a 69 y.o. year old male who is a primary care patient of Susy Frizzle, MD. I reached out to Lenox Ponds by phone today in response to a referral sent by Mr. Naod Sweetland Varghese's PCP, Susy Frizzle, MD.   Mr. Jowers was given information about Chronic Care Management services today including:  1. CCM service includes personalized support from designated clinical staff supervised by his physician, including individualized plan of care and coordination with other care providers 2. 24/7 contact phone numbers for assistance for urgent and routine care needs. 3. Service will only be billed when office clinical staff spend 20 minutes or more in a month to coordinate care. 4. Only one practitioner may furnish and bill the service in a calendar month. 5. The patient may stop CCM services at any time (effective at the end of the month) by phone call to the office staff.   Patient wishes to consider information provided and/or speak with a member of the care team before deciding about enrollment in care management services.   Follow up plan:   Carley Perdue [employer] Scheduler

## 2019-12-12 ENCOUNTER — Other Ambulatory Visit: Payer: Self-pay | Admitting: Family Medicine

## 2019-12-23 ENCOUNTER — Telehealth: Payer: Self-pay | Admitting: Family Medicine

## 2019-12-23 ENCOUNTER — Other Ambulatory Visit: Payer: Self-pay | Admitting: Family Medicine

## 2019-12-23 NOTE — Progress Notes (Signed)
  Chronic Care Management   Outreach Note  12/23/2019 Name: DRAYTON TIEU MRN: [id number] DOB: [birth year]  Referred by: Susy Frizzle, MD Reason for referral : No chief complaint on file.   Third unsuccessful telephone outreach was attempted today. The patient was referred to the pharmacist for assistance with care management and care coordination.   Follow Up Plan:   Carley Perdue [employer] Scheduler

## 2020-02-11 ENCOUNTER — Other Ambulatory Visit: Payer: Self-pay

## 2020-02-11 ENCOUNTER — Other Ambulatory Visit: Payer: Self-pay | Admitting: Family Medicine

## 2020-02-11 ENCOUNTER — Telehealth: Payer: Medicare Other

## 2020-02-11 MED ORDER — VALSARTAN 160 MG PO TABS
160.0000 mg | ORAL_TABLET | Freq: Every day | ORAL | 3 refills | Status: DC
Start: 2020-02-11 — End: 2020-02-11

## 2020-02-11 MED ORDER — VALSARTAN 160 MG PO TABS: 160.0000 mg | ORAL_TABLET | Freq: Every day | ORAL | 3 refills | Status: DC

## 2020-02-11 NOTE — Telephone Encounter (Signed)
I will switch to valsartan 160 mg a day.

## 2020-02-11 NOTE — Telephone Encounter (Signed)
Patient left voicemail stating he was contacted by [facility] who informed him that Losartan 50 mg was "no longer in production". He is requesting a new rx to cover this change.

## 2020-02-11 NOTE — Telephone Encounter (Signed)
Patient aware.

## 2020-03-22 ENCOUNTER — Telehealth: Payer: Self-pay

## 2020-03-22 NOTE — Telephone Encounter (Signed)
Med refill metoprolol tartrate (LOPRESSOR) 100 MG tablet  Pharmacy: [facility]

## 2020-03-23 ENCOUNTER — Telehealth: Payer: Self-pay

## 2020-03-23 ENCOUNTER — Other Ambulatory Visit: Payer: Self-pay

## 2020-03-23 MED ORDER — SIMVASTATIN 40 MG PO TABS: 40.0000 mg | ORAL_TABLET | Freq: Every evening | ORAL | 1 refills | Status: DC

## 2020-03-23 MED ORDER — METOPROLOL TARTRATE 100 MG PO TABS: 100.0000 mg | ORAL_TABLET | Freq: Two times a day (BID) | ORAL | 3 refills | Status: DC

## 2020-03-23 MED ORDER — PIOGLITAZONE HCL 15 MG PO TABS: ORAL_TABLET | ORAL | 1 refills | Status: DC

## 2020-03-23 NOTE — Addendum Note (Signed)
Addended by: Amalia Hailey on: 03/23/2020 05:04 PM   Modules accepted: Orders

## 2020-03-23 NOTE — Telephone Encounter (Signed)
Patient called need med refill  pioglitazone (ACTOS) 15 MG tablet  simvastatin (ZOCOR) 40 MG tablet   Pharmacy: [id number] - [place], [place] - [address]

## 2020-03-25 ENCOUNTER — Telehealth: Payer: Self-pay | Admitting: Family Medicine

## 2020-03-25 MED ORDER — METOPROLOL TARTRATE 100 MG PO TABS: 100.0000 mg | ORAL_TABLET | Freq: Two times a day (BID) | ORAL | 3 refills | Status: DC

## 2020-03-25 NOTE — Telephone Encounter (Signed)
[facility] requesting a refill on metoprolol tartrate (LOPRESSOR) 100 MG tablet

## 2020-03-25 NOTE — Telephone Encounter (Signed)
Prescription sent to pharmacy.

## 2020-04-02 ENCOUNTER — Other Ambulatory Visit: Payer: Self-pay | Admitting: Family Medicine

## 2020-04-15 ENCOUNTER — Other Ambulatory Visit: Payer: Self-pay | Admitting: Family Medicine

## 2020-04-18 NOTE — Telephone Encounter (Signed)
Ok to refill??  Last office visit 07/27/2019.  Last refill 03/24/2019.

## 2020-05-02 ENCOUNTER — Other Ambulatory Visit: Payer: Self-pay | Admitting: *Deleted

## 2020-05-02 ENCOUNTER — Other Ambulatory Visit: Payer: Self-pay | Admitting: Family Medicine

## 2020-05-02 MED ORDER — CLONIDINE HCL 0.3 MG PO TABS: 0.3000 mg | ORAL_TABLET | Freq: Two times a day (BID) | ORAL | 0 refills | Status: DC

## 2020-06-17 ENCOUNTER — Other Ambulatory Visit: Payer: Self-pay | Admitting: Family Medicine

## 2020-06-17 ENCOUNTER — Other Ambulatory Visit: Payer: Self-pay | Admitting: *Deleted

## 2020-06-17 MED ORDER — CLONIDINE HCL 0.3 MG PO TABS: 0.3000 mg | ORAL_TABLET | Freq: Two times a day (BID) | ORAL | 0 refills | Status: DC

## 2020-06-22 ENCOUNTER — Other Ambulatory Visit: Payer: Medicare Other

## 2020-06-22 DIAGNOSIS — N183 Chronic kidney disease, stage 3 unspecified: Secondary | ICD-10-CM

## 2020-06-22 DIAGNOSIS — E118 Type 2 diabetes mellitus with unspecified complications: Secondary | ICD-10-CM

## 2020-06-22 DIAGNOSIS — Z1159 Encounter for screening for other viral diseases: Secondary | ICD-10-CM

## 2020-06-22 DIAGNOSIS — E7800 Pure hypercholesterolemia, unspecified: Secondary | ICD-10-CM

## 2020-06-22 DIAGNOSIS — Z136 Encounter for screening for cardiovascular disorders: Secondary | ICD-10-CM

## 2020-06-22 DIAGNOSIS — I10 Essential (primary) hypertension: Secondary | ICD-10-CM

## 2020-06-23 ENCOUNTER — Other Ambulatory Visit: Payer: Medicare Other

## 2020-06-23 ENCOUNTER — Other Ambulatory Visit: Payer: Self-pay

## 2020-06-23 DIAGNOSIS — Z1159 Encounter for screening for other viral diseases: Secondary | ICD-10-CM | POA: Diagnosis not present

## 2020-06-23 DIAGNOSIS — I10 Essential (primary) hypertension: Secondary | ICD-10-CM | POA: Diagnosis not present

## 2020-06-23 DIAGNOSIS — E118 Type 2 diabetes mellitus with unspecified complications: Secondary | ICD-10-CM

## 2020-06-23 DIAGNOSIS — E7800 Pure hypercholesterolemia, unspecified: Secondary | ICD-10-CM

## 2020-06-23 DIAGNOSIS — N183 Chronic kidney disease, stage 3 unspecified: Secondary | ICD-10-CM | POA: Diagnosis not present

## 2020-06-24 LAB — COMPLETE METABOLIC PANEL WITH GFR
AG Ratio: 1.6 (calc) (ref 1.0–2.5)
ALT: 22 U/L (ref 9–46)
AST: 18 U/L (ref 10–35)
Albumin: 3.9 g/dL (ref 3.6–5.1)
Alkaline phosphatase (APISO): 120 U/L (ref 35–144)
BUN/Creatinine Ratio: 27 (calc) — ABNORMAL HIGH (ref 6–22)
BUN: 47 mg/dL — ABNORMAL HIGH (ref 7–25)
CO2: 19 mmol/L — ABNORMAL LOW (ref 20–32)
Calcium: 8.7 mg/dL (ref 8.6–10.3)
Chloride: 104 mmol/L (ref 98–110)
Creat: 1.74 mg/dL — ABNORMAL HIGH (ref 0.70–1.18)
GFR, Est African American: 45 mL/min/{1.73_m2} — ABNORMAL LOW (ref >=60)
GFR, Est Non African American: 39 mL/min/{1.73_m2} — ABNORMAL LOW (ref >=60)
Globulin: 2.4 g/dL (calc) (ref 1.9–3.7)
Glucose, Bld: 115 mg/dL — ABNORMAL HIGH (ref 65–99)
Potassium: 4.8 mmol/L (ref 3.5–5.3)
Sodium: 138 mmol/L (ref 135–146)
Total Bilirubin: 0.6 mg/dL (ref 0.2–1.2)
Total Protein: 6.3 g/dL (ref 6.1–8.1)

## 2020-06-24 LAB — HEMOGLOBIN A1C
Hgb A1c MFr Bld: 6.2 % of total Hgb — ABNORMAL HIGH (ref <5.7)
Mean Plasma Glucose: 131 mg/dL
eAG (mmol/L): 7.3 mmol/L

## 2020-06-24 LAB — CBC WITH DIFFERENTIAL/PLATELET
Absolute Monocytes: 443 cells/uL (ref 200–950)
Basophils Absolute: 68 cells/uL (ref 0–200)
Basophils Relative: 0.9 %
Eosinophils Absolute: 428 cells/uL (ref 15–500)
Eosinophils Relative: 5.7 %
HCT: 47.1 % (ref 38.5–50.0)
Hemoglobin: 15.2 g/dL (ref 13.2–17.1)
Lymphs Abs: 1245 cells/uL (ref 850–3900)
MCH: 30.6 pg (ref 27.0–33.0)
MCHC: 32.3 g/dL (ref 32.0–36.0)
MCV: 95 fL (ref 80.0–100.0)
MPV: 9.7 fL (ref 7.5–12.5)
Monocytes Relative: 5.9 %
Neutro Abs: 5318 cells/uL (ref 1500–7800)
Neutrophils Relative %: 70.9 %
Platelets: 174 10*3/uL (ref 140–400)
RBC: 4.96 10*6/uL (ref 4.20–5.80)
RDW: 14.2 % (ref 11.0–15.0)
Total Lymphocyte: 16.6 %
WBC: 7.5 10*3/uL (ref 3.8–10.8)

## 2020-06-24 LAB — LIPID PANEL
Cholesterol: 123 mg/dL (ref <200)
HDL: 48 mg/dL (ref >=40)
LDL Cholesterol (Calc): 56 mg/dL (calc)
Non-HDL Cholesterol (Calc): 75 mg/dL (calc) (ref <130)
Total CHOL/HDL Ratio: 2.6 (calc) (ref <5.0)
Triglycerides: 106 mg/dL (ref <150)

## 2020-06-24 LAB — MICROALBUMIN / CREATININE URINE RATIO
Creatinine, Urine: 178 mg/dL (ref 20–320)
Microalb Creat Ratio: 37 mcg/mg creat — ABNORMAL HIGH (ref <30)
Microalb, Ur: 6.5 mg/dL

## 2020-06-24 LAB — HEPATITIS C ANTIBODY
Hepatitis C Ab: NONREACTIVE
SIGNAL TO CUT-OFF: 0.01 (ref <1.00)

## 2020-06-27 ENCOUNTER — Ambulatory Visit: Payer: Medicare Other | Admitting: Family Medicine

## 2020-06-30 NOTE — Progress Notes (Signed)
Subjective:   Harry James is a 70 y.o. male who presents for Medicare Annual/Subsequent preventive examination.  I connected with Glean Hess  today by telephone and verified that I am speaking with the correct person using two identifiers. Location patient: home Location provider: work Persons participating in the virtual visit: patient, provider.   I discussed the limitations, risks, security and privacy concerns of performing an evaluation and management service by telephone and the availability of in person appointments. I also discussed with the patient that there may be a patient responsible charge related to this service. The patient expressed understanding and verbally consented to this telephonic visit.    Interactive audio and video telecommunications were attempted between this provider and patient, however failed, due to patient having technical difficulties OR patient did not have access to video capability.  We continued and completed visit with audio only.     Review of Systems    N/a  Cardiac Risk Factors include: advanced age (>76men, >42 women);male gender;diabetes mellitus;dyslipidemia;obesity (BMI >30kg/m2);hypertension;smoking/ tobacco exposure;sedentary lifestyle     Objective:    Today's Vitals   07/01/20 0901  PainSc: 3    There is no height or weight on file to calculate BMI.  Advanced Directives 07/01/2020 03/05/2016 08/03/2015 12/30/2014 09/01/2014 07/01/2014 04/08/2014  Does Patient Have a Medical Advance Directive? No No No No No No No  Would patient like information on creating a medical advance directive? No - Patient declined No - Patient declined No - patient declined information No - patient declined information No - patient declined information No - patient declined information No - patient declined information  Pre-existing out of facility DNR order (yellow form or pink MOST form) - - - - - - -    Current Medications (verified) Outpatient  Encounter Medications as of 07/01/2020  Medication Sig   cloNIDine (CATAPRES) 0.3 MG tablet TAKE 1 TABLET(0.3 MG) BY MOUTH TWICE DAILY   Fish Oil OIL by Does not apply route. 2 po qhs   furosemide (LASIX) 80 MG tablet TAKE 1 TABLET BY MOUTH EVERY DAY   metoprolol tartrate (LOPRESSOR) 100 MG tablet Take 1 tablet (100 mg total) by mouth 2 (two) times daily.   Multiple Vitamins-Minerals (MULTIVITAMINS THER. W/MINERALS) TABS Take 1 tablet by mouth daily.   omeprazole (PRILOSEC) 40 MG capsule TAKE 1 CAPSULE(40 MG) BY MOUTH DAILY   oxyCODONE-acetaminophen (PERCOCET) 7.5-325 MG tablet Take 1 tablet by mouth every 4 (four) hours as needed for severe pain.   pioglitazone (ACTOS) 15 MG tablet TAKE 1 TABLET(15 MG) BY MOUTH DAILY   PROAIR HFA 108 (90 Base) MCG/ACT inhaler INHALE 2 PUFFS INTO THE LUNGS EVERY 6 HOURS AS NEEDED FOR WHEEZING OR SHORTNESS OF BREATH   simvastatin (ZOCOR) 40 MG tablet Take 1 tablet (40 mg total) by mouth every evening.   traMADol (ULTRAM) 50 MG tablet TAKE 2 TABLETS(100 MG) BY MOUTH FOUR TIMES DAILY   TRELEGY ELLIPTA 100-62.5-25 MCG/INH AEPB INHALE 1 PUFF INTO THE LUNGS DAILY   valsartan (DIOVAN) 160 MG tablet Take 1 tablet (160 mg total) by mouth daily.   No facility-administered encounter medications on file as of 07/01/2020.    Allergies (verified) Patient has no known allergies.   History: Past Medical History:  Diagnosis Date   Chronic kidney disease    has one functional kidney   Diabetes mellitus    prediabetes   Hyperlipidemia    Hypertension    Polycythemia, secondary 01/25/2014   Tobacco abuse  Ulcer    perforated duodenal ulcer 12/12   Past Surgical History:  Procedure Laterality Date   CYSTECTOMY  under tongue   LAPAROTOMY  12/16/2010   [name] patch perf du Procedure: EXPLORATORY LAPAROTOMY;  Surgeon: [name], MD;  Location: [facility];  Service: General;  Laterality: N/A;   History reviewed. No pertinent family history. Social History    Socioeconomic History   Marital status: Divorced    Spouse name: Not on file   Number of children: Not on file   Years of education: Not on file   Highest education level: Not on file  Occupational History   Not on file  Tobacco Use   Smoking status: Every Day    Packs/day: 1.00    Pack years: 0.00    Types: Cigarettes   Smokeless tobacco: Never  Substance and Sexual Activity   Alcohol use: Yes    Comment: daily   Drug use: No   Sexual activity: Not on file  Other Topics Concern   Not on file  Social History Narrative   Not on file   Social Determinants of Health   Financial Resource Strain: Low Risk    Difficulty of Paying Living Expenses: Not hard at all  Food Insecurity: No Food Insecurity   Worried About Charity fundraiser in the Last Year: Never true   [place] in the Last Year: Never true  Transportation Needs: No Transportation Needs   Lack of Transportation (Medical): No   Lack of Transportation (Non-Medical): No  Physical Activity: Inactive   Days of Exercise per Week: 0 days   Minutes of Exercise per Session: 0 min  Stress: No Stress Concern Present   Feeling of Stress : Not at all  Social Connections: Socially Isolated   Frequency of Communication with Friends and Family: Twice a week   Frequency of Social Gatherings with Friends and Family: Once a week   Attends Religious Services: Never   Marine scientist or Organizations: No   Attends Music therapist: Never   Marital Status: Divorced    Tobacco Counseling Ready to quit: Not Answered Counseling given: Not Answered   Clinical Intake:  Pre-visit preparation completed: Yes  Pain : 0-10 Pain Score: 3  Pain Type: Acute pain Pain Location: Knee Pain Orientation: Right, Left Pain Descriptors / Indicators: Aching Pain Onset: In the past 7 days Pain Frequency: Intermittent     Nutritional Risks: None Diabetes: Yes CBG done?: No Did pt. bring in CBG monitor from  home?: No  How often do you need to have someone help you when you read instructions, pamphlets, or other written materials from your doctor or pharmacy?: 1 - Never  Diabetic?Yes Nutrition Risk Assessment:  Has the patient had any N/V/D within the last 2 months?  No  Does the patient have any non-healing wounds?  No  Has the patient had any unintentional weight loss or weight gain?  No   Diabetes:  Is the patient diabetic?  Yes  If diabetic, was a CBG obtained today?  No  Did the patient bring in their glucometer from home?  No  How often do you monitor your CBG's? Patient states does not check glucose at home .   Financial Strains and Diabetes Management:  Are you having any financial strains with the device, your supplies or your medication? No .  Does the patient want to be seen by Chronic Care Management for management of their diabetes?  No  Would the patient like to be referred to a Nutritionist or for Diabetic Management?  No   Diabetic Exams:  Diabetic Eye Exam: Overdue for diabetic eye exam. Pt has been advised about the importance in completing this exam. Patient advised to call and schedule an eye exam. Diabetic Foot Exam: Overdue, Pt has been advised about the importance in completing this exam. Pt is scheduled for diabetic foot exam on 07/08/2020.   Interpreter Needed?: No  Information entered by :: [place] of Daily Living In your present state of health, do you have any difficulty performing the following activities: 07/01/2020 07/27/2019  Hearing? N N  Vision? N N  Difficulty concentrating or making decisions? N N  Walking or climbing stairs? Y N  Comment Patient states gets a little sob -  Dressing or bathing? N N  Doing errands, shopping? N N  Preparing Food and eating ? N -  Using the Toilet? N -  In the past six months, have you accidently leaked urine? N -  Do you have problems with loss of bowel control? N -  Managing your  Medications? N -  Managing your Finances? N -  Some recent data might be hidden    Patient Care Team: Susy Frizzle, MD as PCP - General (Family Medicine)  Indicate any recent Medical Services you may have received from other than [facility] providers in the past year (date may be approximate).     Assessment:   This is a routine wellness examination for [place].  Hearing/Vision screen Vision Screening - Comments:: Patient states does not get eye exams. Has glasses does not wear them  Dietary issues and exercise activities discussed: Current Exercise Habits: The patient does not participate in regular exercise at present, Exercise limited by: respiratory conditions(s)   Goals Addressed             This Visit's Progress    Exercise 3x per week (30 min per time)       HEMOGLOBIN A1C < 7         Depression Screen PHQ 2/9 Scores 07/01/2020 07/27/2019 08/09/2017 02/04/2014 01/28/2014 11/27/2013  PHQ - 2 Score 0 0 0 0 0 0    Fall Risk Fall Risk  07/01/2020 07/27/2019 08/09/2017 02/04/2014 01/28/2014  Falls in the past year? 1 0 No No No  Number falls in past yr: 0 0 - - -  Injury with Fall? 0 0 - - -  Risk for fall due to : No Fall Risks - - - -  Follow up Falls evaluation completed;Falls prevention discussed - - - -    FALL RISK PREVENTION PERTAINING TO THE HOME:  Any stairs in or around the home? Yes  If so, are there any without handrails? No  Home free of loose throw rugs in walkways, pet beds, electrical cords, etc? Yes  Adequate lighting in your home to reduce risk of falls? Yes   ASSISTIVE DEVICES UTILIZED TO PREVENT FALLS:  Life alert? No  Use of a cane, walker or w/c? No  Grab bars in the bathroom? No  Shower chair or bench in shower? No  Elevated toilet seat or a handicapped toilet? No    Cognitive Function:  Normal cognitive status assessed by direct observation by this Nurse Health Advisor. No abnormalities found.        Immunizations Immunization History   Administered Date(s) Administered   Influenza,inj,Quad PF,6+ Mos 11/13/2013, 11/15/2014, 12/07/2015, 11/13/2017   Influenza-Unspecified 12/15/2012  Pneumococcal Conjugate-13 11/15/2014   Pneumococcal Polysaccharide-23 11/13/2013    TDAP status: Due, Education has been provided regarding the importance of this vaccine. Advised may receive this vaccine at local pharmacy or Health Dept. Aware to provide a copy of the vaccination record if obtained from local pharmacy or Health Dept. Verbalized acceptance and understanding.  Flu Vaccine status: Due, Education has been provided regarding the importance of this vaccine. Advised may receive this vaccine at local pharmacy or Health Dept. Aware to provide a copy of the vaccination record if obtained from local pharmacy or Health Dept. Verbalized acceptance and understanding.  Pneumococcal vaccine status: Up to date  Covid-19 vaccine status: Completed vaccines  Qualifies for Shingles Vaccine? Yes   Zostavax completed No   Shingrix Completed?: No.    Education has been provided regarding the importance of this vaccine. Patient has been advised to call insurance company to determine out of pocket expense if they have not yet received this vaccine. Advised may also receive vaccine at local pharmacy or Health Dept. Verbalized acceptance and understanding.  Screening Tests Health Maintenance  Topic Date Due   COVID-19 Vaccine (1) Never done   TETANUS/TDAP  Never done   COLONOSCOPY (Pts 45-60yrs Insurance coverage will need to be confirmed)  Never done   Zoster Vaccines- Shingrix (1 of 2) Never done   FOOT EXAM  06/21/2017   OPHTHALMOLOGY EXAM  06/21/2017   PNA vac Low Risk Adult (2 of 2 - PPSV23) 11/14/2018   INFLUENZA VACCINE  08/15/2020   HEMOGLOBIN A1C  12/23/2020   Hepatitis C Screening  Completed   HPV VACCINES  Aged Out    Health Maintenance  Health Maintenance Due  Topic Date Due   COVID-19 Vaccine (1) Never done   TETANUS/TDAP   Never done   COLONOSCOPY (Pts 45-44yrs Insurance coverage will need to be confirmed)  Never done   Zoster Vaccines- Shingrix (1 of 2) Never done   FOOT EXAM  06/21/2017   OPHTHALMOLOGY EXAM  06/21/2017   PNA vac Low Risk Adult (2 of 2 - PPSV23) 11/14/2018    Colorectal Cancer Screening: Patient has cologuard testing at home states he will complete the test  Lung Cancer Screening: (Low Dose CT Chest recommended if Age 34-80 years, 30 pack-year currently smoking OR have quit w/in 15years.) does qualify.   Lung Cancer Screening Referral: Patient declined  Additional Screening:  Hepatitis C Screening: does qualify; Completed 06/23/2020  Vision Screening: Recommended annual ophthalmology exams for early detection of glaucoma and other disorders of the eye. Is the patient up to date with their annual eye exam?  No  Who is the provider or what is the name of the office in which the patient attends annual eye exams? Does not have an eye doctor  If pt is not established with a provider, would they like to be referred to a provider to establish care? No .   Dental Screening: Recommended annual dental exams for proper oral hygiene  Community Resource Referral / Chronic Care Management: CRR required this visit?  No   CCM required this visit?  No      Plan:     I have personally reviewed and noted the following in the patient's chart:   Medical and social history Use of alcohol, tobacco or illicit drugs  Current medications and supplements including opioid prescriptions. Patient is currently taking opioid prescriptions. Information provided to patient regarding non-opioid alternatives. Patient advised to discuss non-opioid treatment plan with their provider. Functional  ability and status Nutritional status Physical activity Advanced directives List of other physicians Hospitalizations, surgeries, and ER visits in previous 12 months Vitals Screenings to include cognitive, depression,  and falls Referrals and appointments  In addition, I have reviewed and discussed with patient certain preventive protocols, quality metrics, and best practice recommendations. A written personalized care plan for preventive services as well as general preventive health recommendations were provided to patient.     Ofilia Neas, LPN   [id number]   Nurse Notes: None

## 2020-07-01 ENCOUNTER — Ambulatory Visit (INDEPENDENT_AMBULATORY_CARE_PROVIDER_SITE_OTHER): Payer: Medicare Other

## 2020-07-01 DIAGNOSIS — Z0000 Encounter for general adult medical examination without abnormal findings: Secondary | ICD-10-CM | POA: Diagnosis not present

## 2020-07-01 NOTE — Patient Instructions (Signed)
Harry James , Thank you for taking time to come for your Medicare Wellness Visit. I appreciate your ongoing commitment to your health goals. Please review the following plan we discussed and let me know if I can assist you in the future.   Screening recommendations/referrals: Colonoscopy: Currently due, please complete your cologuard test  Recommended yearly ophthalmology/optometry visit for glaucoma screening and checkup Recommended yearly dental visit for hygiene and checkup  Vaccinations: Influenza vaccine: Up to date, next due fall 70  Pneumococcal vaccine: Completed series  Tdap vaccine: Currently due, you may await and injury to receive  Shingles vaccine: Currently due for Shingrix, if you would like to receive we recommend that you do so at your local pharmacy    Advanced directives: Advance directive discussed with you today. Even though you declined this today please call our office should you change your mind and we can give you the proper paperwork for you to fill out.   Conditions/risks identified: None   Next appointment: 07/08/2020 with Dr. Dennard Schaumann  Preventive Care 71 Years and Older, Male Preventive care refers to lifestyle choices and visits with your health care provider that can promote health and wellness. What does preventive care include? A yearly physical exam. This is also called an annual well check. Dental exams once or twice a year. Routine eye exams. Ask your health care provider how often you should have your eyes checked. Personal lifestyle choices, including: Daily care of your teeth and gums. Regular physical activity. Eating a healthy diet. Avoiding tobacco and drug use. Limiting alcohol use. Practicing safe sex. Taking low doses of aspirin every day. Taking vitamin and mineral supplements as recommended by your health care provider. What happens during an annual well check? The services and screenings done by your health care provider during your  annual well check will depend on your age, overall health, lifestyle risk factors, and family history of disease. Counseling  Your health care provider may ask you questions about your: Alcohol use. Tobacco use. Drug use. Emotional well-being. Home and relationship well-being. Sexual activity. Eating habits. History of falls. Memory and ability to understand (cognition). Work and work Statistician. Screening  You may have the following tests or measurements: Height, weight, and BMI. Blood pressure. Lipid and cholesterol levels. These may be checked every 5 years, or more frequently if you are over 8 years old. Skin check. Lung cancer screening. You may have this screening every year starting at age 71 if you have a 30-pack-year history of smoking and currently smoke or have quit within the past 15 years. Fecal occult blood test (FOBT) of the stool. You may have this test every year starting at age 71. Flexible sigmoidoscopy or colonoscopy. You may have a sigmoidoscopy every 5 years or a colonoscopy every 10 years starting at age 71. Prostate cancer screening. Recommendations will vary depending on your family history and other risks. Hepatitis C blood test. Hepatitis B blood test. Sexually transmitted disease (STD) testing. Diabetes screening. This is done by checking your blood sugar (glucose) after you have not eaten for a while (fasting). You may have this done every 1-3 years. Abdominal aortic aneurysm (AAA) screening. You may need this if you are a current or former smoker. Osteoporosis. You may be screened starting at age 71 if you are at high risk. Talk with your health care provider about your test results, treatment options, and if necessary, the need for more tests. Vaccines  Your health care provider may recommend certain vaccines,  such as: Influenza vaccine. This is recommended every year. Tetanus, diphtheria, and acellular pertussis (Tdap, Td) vaccine. You may need a Td  booster every 10 years. Zoster vaccine. You may need this after age 70. Pneumococcal 13-valent conjugate (PCV13) vaccine. One dose is recommended after age 71. Pneumococcal polysaccharide (PPSV23) vaccine. One dose is recommended after age 71. Talk to your health care provider about which screenings and vaccines you need and how often you need them. This information is not intended to replace advice given to you by your health care provider. Make sure you discuss any questions you have with your health care provider. Document Released: 01/28/2015 Document Revised: 09/21/2015 Document Reviewed: 11/02/2014 Elsevier Interactive Patient Education  2017 [place] Prevention in the Home Falls can cause injuries. They can happen to people of all ages. There are many things you can do to make your home safe and to help prevent falls. What can I do on the outside of my home? Regularly fix the edges of walkways and driveways and fix any cracks. Remove anything that might make you trip as you walk through a door, such as a raised step or threshold. Trim any bushes or trees on the path to your home. Use bright outdoor lighting. Clear any walking paths of anything that might make someone trip, such as rocks or tools. Regularly check to see if handrails are loose or broken. Make sure that both sides of any steps have handrails. Any raised decks and porches should have guardrails on the edges. Have any leaves, snow, or ice cleared regularly. Use sand or salt on walking paths during winter. Clean up any spills in your garage right away. This includes oil or grease spills. What can I do in the bathroom? Use night lights. Install grab bars by the toilet and in the tub and shower. Do not use towel bars as grab bars. Use non-skid mats or decals in the tub or shower. If you need to sit down in the shower, use a plastic, non-slip stool. Keep the floor dry. Clean up any water that spills on the floor  as soon as it happens. Remove soap buildup in the tub or shower regularly. Attach bath mats securely with double-sided non-slip rug tape. Do not have throw rugs and other things on the floor that can make you trip. What can I do in the bedroom? Use night lights. Make sure that you have a light by your bed that is easy to reach. Do not use any sheets or blankets that are too big for your bed. They should not hang down onto the floor. Have a firm chair that has side arms. You can use this for support while you get dressed. Do not have throw rugs and other things on the floor that can make you trip. What can I do in the kitchen? Clean up any spills right away. Avoid walking on wet floors. Keep items that you use a lot in easy-to-reach places. If you need to reach something above you, use a strong step stool that has a grab bar. Keep electrical cords out of the way. Do not use floor polish or wax that makes floors slippery. If you must use wax, use non-skid floor wax. Do not have throw rugs and other things on the floor that can make you trip. What can I do with my stairs? Do not leave any items on the stairs. Make sure that there are handrails on both sides of the stairs and  use them. Fix handrails that are broken or loose. Make sure that handrails are as long as the stairways. Check any carpeting to make sure that it is firmly attached to the stairs. Fix any carpet that is loose or worn. Avoid having throw rugs at the top or bottom of the stairs. If you do have throw rugs, attach them to the floor with carpet tape. Make sure that you have a light switch at the top of the stairs and the bottom of the stairs. If you do not have them, ask someone to add them for you. What else can I do to help prevent falls? Wear shoes that: Do not have high heels. Have rubber bottoms. Are comfortable and fit you well. Are closed at the toe. Do not wear sandals. If you use a stepladder: Make sure that it is  fully opened. Do not climb a closed stepladder. Make sure that both sides of the stepladder are locked into place. Ask someone to hold it for you, if possible. Clearly mark and make sure that you can see: Any grab bars or handrails. First and last steps. Where the edge of each step is. Use tools that help you move around (mobility aids) if they are needed. These include: Canes. Walkers. Scooters. Crutches. Turn on the lights when you go into a dark area. Replace any light bulbs as soon as they burn out. Set up your furniture so you have a clear path. Avoid moving your furniture around. If any of your floors are uneven, fix them. If there are any pets around you, be aware of where they are. Review your medicines with your doctor. Some medicines can make you feel dizzy. This can increase your chance of falling. Ask your doctor what other things that you can do to help prevent falls. This information is not intended to replace advice given to you by your health care provider. Make sure you discuss any questions you have with your health care provider. Document Released: 10/28/2008 Document Revised: 06/09/2015 Document Reviewed: 02/05/2014 Elsevier Interactive Patient Education  2017 [employer].

## 2020-07-08 ENCOUNTER — Other Ambulatory Visit: Payer: Self-pay

## 2020-07-08 ENCOUNTER — Ambulatory Visit (INDEPENDENT_AMBULATORY_CARE_PROVIDER_SITE_OTHER): Payer: Medicare Other | Admitting: Family Medicine

## 2020-07-08 ENCOUNTER — Encounter: Payer: Self-pay | Admitting: Family Medicine

## 2020-07-08 ENCOUNTER — Other Ambulatory Visit: Payer: Self-pay | Admitting: Family Medicine

## 2020-07-08 VITALS — BP 136/88 | HR 70 | Temp 98.7°F | Resp 18 | Ht 66.0 in | Wt 239.0 lb

## 2020-07-08 DIAGNOSIS — E118 Type 2 diabetes mellitus with unspecified complications: Secondary | ICD-10-CM

## 2020-07-08 DIAGNOSIS — N183 Chronic kidney disease, stage 3 unspecified: Secondary | ICD-10-CM | POA: Diagnosis not present

## 2020-07-08 DIAGNOSIS — I10 Essential (primary) hypertension: Secondary | ICD-10-CM | POA: Diagnosis not present

## 2020-07-08 DIAGNOSIS — E7800 Pure hypercholesterolemia, unspecified: Secondary | ICD-10-CM

## 2020-07-08 MED ORDER — DOXAZOSIN MESYLATE 2 MG PO TABS: 2.0000 mg | ORAL_TABLET | Freq: Every day | ORAL | 1 refills | Status: DC

## 2020-07-08 MED ORDER — ALBUTEROL SULFATE HFA 108 (90 BASE) MCG/ACT IN AERS: 2.0000 | INHALATION_SPRAY | Freq: Four times a day (QID) | RESPIRATORY_TRACT | 2 refills | Status: DC | PRN

## 2020-07-08 NOTE — Progress Notes (Signed)
Subjective:    Patient ID: Harry James, male    DOB: [birth year], 70 y.o.   MRN: [id number]  Patient is a 70 year old Caucasian gentleman with a history of diabetes mellitus, hypertension, hyperlipidemia, and chronic kidney disease.  Recently his creatinine has risen from 1.2-1.7.  He believes he is not drinking enough.  He also reports lower urinary tract symptoms such as consistent with BPH.  He has been taking doxazosin on his own and has noticed some improvement in his urinary retention.  He denies any dysuria or hematuria or urgency.  He denies any back pain or nausea or vomiting.  He denies any fevers or chills.  He denies any chest pain shortness of breath or dyspnea on exertion.  His blood pressure today is well controlled.  His most recent lab work is listed below Lab on 06/23/2020  Component Date Value Ref Range Status   Hgb A1c MFr Bld 06/23/2020 6.2 (A) <5.7 % of total Hgb Final   Comment: For someone without known diabetes, a hemoglobin  A1c value between 5.7% and 6.4% is consistent with prediabetes and should be confirmed with a  follow-up test. . For someone with known diabetes, a value <7% indicates that their diabetes is well controlled. A1c targets should be individualized based on duration of diabetes, age, comorbid conditions, and other considerations. . This assay result is consistent with an increased risk of diabetes. . Currently, no consensus exists regarding use of hemoglobin A1c for diagnosis of diabetes for children. .    Mean Plasma Glucose 06/23/2020 131  mg/dL Final   eAG (mmol/L) 06/23/2020 7.3  mmol/L Final   Glucose, Bld 06/23/2020 115 (A) 65 - 99 mg/dL Final   Comment: .            Fasting reference interval . For someone without known diabetes, a glucose value between 100 and 125 mg/dL is consistent with prediabetes and should be confirmed with a follow-up test. .    BUN 06/23/2020 47 (A) 7 - 25 mg/dL Final   Creat 06/23/2020 1.74 (A)  0.70 - 1.18 mg/dL Final   Comment: For patients >46 years of age, the reference limit for Creatinine is approximately 13% higher for people identified as African-American. .    GFR, Est Non African American 06/23/2020 39 (A) > OR = 60 mL/min/1.26m2 Final   GFR, Est African American 06/23/2020 45 (A) > OR = 60 mL/min/1.20m2 Final   BUN/Creatinine Ratio 06/23/2020 27 (A) 6 - 22 (calc) Final   Sodium 06/23/2020 138  135 - 146 mmol/L Final   Potassium 06/23/2020 4.8  3.5 - 5.3 mmol/L Final   Chloride 06/23/2020 104  98 - 110 mmol/L Final   CO2 06/23/2020 19 (A) 20 - 32 mmol/L Final   Calcium 06/23/2020 8.7  8.6 - 10.3 mg/dL Final   Total Protein 06/23/2020 6.3  6.1 - 8.1 g/dL Final   Albumin 06/23/2020 3.9  3.6 - 5.1 g/dL Final   Globulin 06/23/2020 2.4  1.9 - 3.7 g/dL (calc) Final   AG Ratio 06/23/2020 1.6  1.0 - 2.5 (calc) Final   Total Bilirubin 06/23/2020 0.6  0.2 - 1.2 mg/dL Final   Alkaline phosphatase (APISO) 06/23/2020 120  35 - 144 U/L Final   AST 06/23/2020 18  10 - 35 U/L Final   ALT 06/23/2020 22  9 - 46 U/L Final   WBC 06/23/2020 7.5  3.8 - 10.8 Thousand/uL Final   RBC 06/23/2020 4.96  4.20 - 5.80 Million/uL Final  Hemoglobin 06/23/2020 15.2  13.2 - 17.1 g/dL Final   HCT 06/23/2020 47.1  38.5 - 50.0 % Final   MCV 06/23/2020 95.0  80.0 - 100.0 fL Final   MCH 06/23/2020 30.6  27.0 - 33.0 pg Final   MCHC 06/23/2020 32.3  32.0 - 36.0 g/dL Final   RDW 06/23/2020 14.2  11.0 - 15.0 % Final   Platelets 06/23/2020 174  140 - 400 Thousand/uL Final   MPV 06/23/2020 9.7  7.5 - 12.5 fL Final   Neutro Abs 06/23/2020 5,318  1,500 - 7,800 cells/uL Final   Lymphs Abs 06/23/2020 1,245  850 - 3,900 cells/uL Final   Absolute Monocytes 06/23/2020 443  200 - 950 cells/uL Final   Eosinophils Absolute 06/23/2020 428  15 - 500 cells/uL Final   Basophils Absolute 06/23/2020 68  0 - 200 cells/uL Final   Neutrophils Relative % 06/23/2020 70.9  % Final   Total Lymphocyte 06/23/2020 16.6  % Final    Monocytes Relative 06/23/2020 5.9  % Final   Eosinophils Relative 06/23/2020 5.7  % Final   Basophils Relative 06/23/2020 0.9  % Final   Cholesterol 06/23/2020 123  <200 mg/dL Final   HDL 06/23/2020 48  > OR = 40 mg/dL Final   Triglycerides 06/23/2020 106  <150 mg/dL Final   LDL Cholesterol (Calc) 06/23/2020 56  mg/dL (calc) Final   Comment: Reference range: <100 . Desirable range <100 mg/dL for primary prevention;   <70 mg/dL for patients with CHD or diabetic patients  with > or = 2 CHD risk factors. Marland Kitchen LDL-C is now calculated using the Martin-Hopkins  calculation, which is a validated novel method providing  better accuracy than the Friedewald equation in the  estimation of LDL-C.  Cresenciano Genre et al. Annamaria Helling. 8416;606(30): 2061-2068  (http://education.[facility].com/faq/FAQ164)    Total CHOL/HDL Ratio 06/23/2020 2.6  <5.0 (calc) Final   Non-HDL Cholesterol (Calc) 06/23/2020 75  <130 mg/dL (calc) Final   Comment: For patients with diabetes plus 1 major ASCVD risk  factor, treating to a non-HDL-C goal of <100 mg/dL  (LDL-C of <70 mg/dL) is considered a therapeutic  option.    Hepatitis C Ab 06/23/2020 NON-REACTIVE  NON-REACTIVE Final   SIGNAL TO CUT-OFF 06/23/2020 0.01  <1.00 Final   Comment: . HCV antibody was non-reactive. There is no laboratory  evidence of HCV infection. . In most cases, no further action is required. However, if recent HCV exposure is suspected, a test for HCV RNA (test code [id number]) is suggested. . For additional information please refer to http://education.quest[facility].com/faq/FAQ22v1 (This link is being provided for informational/ educational purposes only.) .    Creatinine, Urine 06/23/2020 178  20 - 320 mg/dL Final   Microalb, Ur 06/23/2020 6.5  mg/dL Final   Comment: Reference Range Not established    Microalb Creat Ratio 06/23/2020 37 (A) <30 mcg/mg creat Final   Comment: . The ADA defines abnormalities in albumin excretion as  follows: Marland Kitchen Albuminuria Category        Result (mcg/mg creatinine) . Normal to Mildly increased   <30 Moderately increased         30-299  Severely increased           > OR = 300 . The ADA recommends that at least two of three specimens collected within a 3-6 month period be abnormal before considering a patient to be within a diagnostic category.      Past Medical History:  Diagnosis Date   Chronic kidney disease  has one functional kidney   Diabetes mellitus    prediabetes   Hyperlipidemia    Hypertension    Polycythemia, secondary 01/25/2014   Tobacco abuse    Ulcer    perforated duodenal ulcer 12/12   Past Surgical History:  Procedure Laterality Date   CYSTECTOMY  under tongue   LAPAROTOMY  12/16/2010   [name] patch perf du Procedure: EXPLORATORY LAPAROTOMY;  Surgeon: [name], MD;  Location: [facility];  Service: General;  Laterality: N/A;   Current Outpatient Medications on File Prior to Visit  Medication Sig Dispense Refill   cloNIDine (CATAPRES) 0.3 MG tablet TAKE 1 TABLET(0.3 MG) BY MOUTH TWICE DAILY 180 tablet 3   Fish Oil OIL by Does not apply route. 2 po qhs     furosemide (LASIX) 80 MG tablet TAKE 1 TABLET BY MOUTH EVERY DAY 90 tablet 3   metoprolol tartrate (LOPRESSOR) 100 MG tablet Take 1 tablet (100 mg total) by mouth 2 (two) times daily. 180 tablet 3   Multiple Vitamins-Minerals (MULTIVITAMINS THER. W/MINERALS) TABS Take 1 tablet by mouth daily. 30 each    omeprazole (PRILOSEC) 40 MG capsule TAKE 1 CAPSULE(40 MG) BY MOUTH DAILY 90 capsule 3   oxyCODONE-acetaminophen (PERCOCET) 7.5-325 MG tablet Take 1 tablet by mouth every 4 (four) hours as needed for severe pain. 30 tablet 0   pioglitazone (ACTOS) 15 MG tablet TAKE 1 TABLET(15 MG) BY MOUTH DAILY 90 tablet 1   simvastatin (ZOCOR) 40 MG tablet Take 1 tablet (40 mg total) by mouth every evening. 90 tablet 1   traMADol (ULTRAM) 50 MG tablet TAKE 2 TABLETS(100 MG) BY MOUTH FOUR TIMES DAILY 60 tablet 0    TRELEGY ELLIPTA 100-62.5-25 MCG/INH AEPB INHALE 1 PUFF INTO THE LUNGS DAILY 60 each 3   valsartan (DIOVAN) 160 MG tablet Take 1 tablet (160 mg total) by mouth daily. 90 tablet 3   No current facility-administered medications on file prior to visit.   No Known Allergies Social History   Socioeconomic History   Marital status: Divorced    Spouse name: Not on file   Number of children: Not on file   Years of education: Not on file   Highest education level: Not on file  Occupational History   Not on file  Tobacco Use   Smoking status: Every Day    Packs/day: 1.00    Pack years: 0.00    Types: Cigarettes   Smokeless tobacco: Never  Substance and Sexual Activity   Alcohol use: Yes    Comment: daily   Drug use: No   Sexual activity: Not on file  Other Topics Concern   Not on file  Social History Narrative   Not on file   Social Determinants of Health   Financial Resource Strain: Low Risk    Difficulty of Paying Living Expenses: Not hard at all  Food Insecurity: No Food Insecurity   Worried About Charity fundraiser in the Last Year: Never true   [place] in the Last Year: Never true  Transportation Needs: No Transportation Needs   Lack of Transportation (Medical): No   Lack of Transportation (Non-Medical): No  Physical Activity: Inactive   Days of Exercise per Week: 0 days   Minutes of Exercise per Session: 0 min  Stress: No Stress Concern Present   Feeling of Stress : Not at all  Social Connections: Socially Isolated   Frequency of Communication with Friends and Family: Twice a week   Frequency of  Social Gatherings with Friends and Family: Once a week   Attends Religious Services: Never   Marine scientist or Organizations: No   Attends Music therapist: Never   Marital Status: Divorced  Human resources officer Violence: Not At Risk   Fear of Current or Ex-Partner: No   Emotionally Abused: No   Physically Abused: No   Sexually Abused: No    History reviewed. No pertinent family history.    Review of Systems  All other systems reviewed and are negative.     Objective:   Physical Exam Vitals reviewed.  Constitutional:      General: He is not in acute distress.    Appearance: He is well-developed. He is obese. He is not ill-appearing or toxic-appearing.  HENT:     Right Ear: Tympanic membrane and ear canal normal.     Left Ear: Tympanic membrane and ear canal normal.     Nose: Nose normal. No congestion or rhinorrhea.  Eyes:     Extraocular Movements: Extraocular movements intact.     Conjunctiva/sclera: Conjunctivae normal.     Pupils: Pupils are equal, round, and reactive to light.  Neck:     Thyroid: No thyromegaly.     Vascular: No JVD.  Cardiovascular:     Rate and Rhythm: Normal rate and regular rhythm.     Heart sounds: Normal heart sounds. No murmur heard.   No friction rub. No gallop.  Pulmonary:     Effort: Pulmonary effort is normal. No respiratory distress.     Breath sounds: Decreased air movement present. Decreased breath sounds and wheezing present. No rales.  Chest:     Chest wall: No tenderness.  Abdominal:     General: Bowel sounds are normal. There is no distension.     Palpations: Abdomen is soft. There is no mass.     Tenderness: There is no abdominal tenderness. There is no guarding or rebound.  Musculoskeletal:     Right lower leg: No edema.     Left lower leg: No edema.  Skin:    Findings: No erythema or rash.  Neurological:     General: No focal deficit present.     Mental Status: He is alert and oriented to person, place, and time.     Cranial Nerves: No cranial nerve deficit.     Sensory: No sensory deficit.     Motor: No weakness or abnormal muscle tone.     Gait: Gait normal.  Psychiatric:        Mood and Affect: Mood normal.        Behavior: Behavior normal.        Thought Content: Thought content normal.        Judgment: Judgment normal.          Assessment &  Plan:   Controlled type 2 diabetes mellitus with complication, without long-term current use of insulin (HCC)  Pure hypercholesterolemia  Essential hypertension  Stage 3 chronic kidney disease, unspecified whether stage 3a or 3b CKD ([place]) I am concerned about the rise in his creatinine.  I recommended the patient decrease his furosemide by 50% and try to drink more fluid and then we recheck a BMP in 2 weeks.  If still elevated at that point I would recommend a renal ultrasound, SPEP, and urinalysis.  Blood pressure today is well controlled.  I will add doxazosin 2 mg a day for urinary retention and BPH symptoms.  Cholesterol is acceptable.  A1c is  outstanding.  I congratulated the patient on otherwise good lab work aside from the elevation in his creatinine.  Recheck renal function in 2 weeks

## 2020-09-01 ENCOUNTER — Other Ambulatory Visit: Payer: Self-pay | Admitting: Family Medicine

## 2020-09-14 ENCOUNTER — Other Ambulatory Visit: Payer: Self-pay | Admitting: Family Medicine

## 2020-09-28 ENCOUNTER — Other Ambulatory Visit: Payer: Self-pay | Admitting: *Deleted

## 2020-09-28 MED ORDER — FUROSEMIDE 80 MG PO TABS: 80.0000 mg | ORAL_TABLET | Freq: Every day | ORAL | 3 refills | Status: AC

## 2020-09-28 MED ORDER — PIOGLITAZONE HCL 15 MG PO TABS: ORAL_TABLET | ORAL | 1 refills | Status: DC

## 2020-09-28 MED ORDER — SIMVASTATIN 40 MG PO TABS: 40.0000 mg | ORAL_TABLET | Freq: Every evening | ORAL | 1 refills | Status: DC

## 2020-10-07 ENCOUNTER — Other Ambulatory Visit: Payer: Self-pay | Admitting: *Deleted

## 2020-10-07 MED ORDER — CLONIDINE HCL 0.3 MG PO TABS: ORAL_TABLET | ORAL | 3 refills | Status: DC

## 2020-10-09 ENCOUNTER — Other Ambulatory Visit: Payer: Self-pay | Admitting: Family Medicine

## 2020-11-03 ENCOUNTER — Other Ambulatory Visit: Payer: Self-pay | Admitting: Family Medicine

## 2020-11-09 ENCOUNTER — Other Ambulatory Visit: Payer: Self-pay

## 2020-11-09 MED ORDER — TRELEGY ELLIPTA 100-62.5-25 MCG/ACT IN AEPB: INHALATION_SPRAY | RESPIRATORY_TRACT | 3 refills | Status: DC

## 2020-12-01 ENCOUNTER — Telehealth: Payer: Self-pay | Admitting: *Deleted

## 2020-12-01 NOTE — Telephone Encounter (Signed)
Received call from patient.   Reports that Trelegy co-pay is usually around $100 per month, but is now $450.   Please advise.

## 2020-12-01 NOTE — Telephone Encounter (Signed)
Samples left at front desk.   Call placed to patient and patient made aware.

## 2021-01-10 ENCOUNTER — Other Ambulatory Visit: Payer: Self-pay | Admitting: Family Medicine

## 2021-01-27 ENCOUNTER — Other Ambulatory Visit: Payer: Self-pay

## 2021-01-27 DIAGNOSIS — J449 Chronic obstructive pulmonary disease, unspecified: Secondary | ICD-10-CM

## 2021-01-27 MED ORDER — TRELEGY ELLIPTA 100-62.5-25 MCG/ACT IN AEPB: INHALATION_SPRAY | RESPIRATORY_TRACT | 3 refills | Status: DC

## 2021-03-01 ENCOUNTER — Other Ambulatory Visit: Payer: Self-pay | Admitting: Family Medicine

## 2021-03-15 ENCOUNTER — Other Ambulatory Visit: Payer: Self-pay | Admitting: Family Medicine

## 2021-03-22 ENCOUNTER — Other Ambulatory Visit: Payer: Self-pay | Admitting: Family Medicine

## 2021-03-25 ENCOUNTER — Other Ambulatory Visit: Payer: Self-pay | Admitting: Family Medicine

## 2021-04-02 ENCOUNTER — Other Ambulatory Visit: Payer: Self-pay | Admitting: Family Medicine

## 2021-04-03 ENCOUNTER — Other Ambulatory Visit: Payer: Self-pay | Admitting: Family Medicine

## 2021-04-04 NOTE — Telephone Encounter (Signed)
LOV 07/08/20 ?Last refill 04/18/20, #60, 0 refills ? ?Please review, thanks! ? ?

## 2021-05-18 ENCOUNTER — Other Ambulatory Visit: Payer: Self-pay | Admitting: Family Medicine

## 2021-05-18 NOTE — Telephone Encounter (Signed)
Requested Prescriptions  ?Pending Prescriptions Disp Refills  ?? albuterol (VENTOLIN HFA) 108 (90 Base) MCG/ACT inhaler [Pharmacy Med Name: ALBUTEROL HFA INH (200 PUFFS) 8.5GM] 8.5 g 1  ?  Sig: INHALE 2 PUFFS INTO THE LUNGS EVERY 6 HOURS AS NEEDED FOR WHEEZING OR SHORTNESS OF BREATH  ?  ? Pulmonology:  Beta Agonists 2 Passed - 05/18/2021  3:08 AM  ?  ?  Passed - Last BP in normal range  ?  BP Readings from Last 1 Encounters:  ?07/08/20 136/88  ?   ?  ?  Passed - Last Heart Rate in normal range  ?  Pulse Readings from Last 1 Encounters:  ?07/08/20 70  ?   ?  ?  Passed - Valid encounter within last 12 months  ?  Recent Outpatient Visits   ?      ? 10 months ago Controlled type 2 diabetes mellitus with complication, without long-term current use of insulin ([place])  ? [facility] Pickard, Cammie Mcgee, MD  ? 1 year ago Controlled type 2 diabetes mellitus with complication, without long-term current use of insulin ([place])  ? [facility] Pickard, Cammie Mcgee, MD  ? 2 years ago Controlled type 2 diabetes mellitus with complication, without long-term current use of insulin ([place])  ? [facility] Pickard, Cammie Mcgee, MD  ? 3 years ago Controlled type 2 diabetes mellitus with complication, without long-term current use of insulin ([place])  ? [facility] Pickard, Cammie Mcgee, MD  ? 4 years ago Controlled type 2 diabetes mellitus with complication, without long-term current use of insulin ([place])  ? [facility] Pickard, Cammie Mcgee, MD  ?  ?  ? ?  ?  ?  ? ? ?

## 2021-05-31 ENCOUNTER — Other Ambulatory Visit: Payer: Self-pay | Admitting: Family Medicine

## 2021-05-31 DIAGNOSIS — J449 Chronic obstructive pulmonary disease, unspecified: Secondary | ICD-10-CM

## 2021-05-31 NOTE — Telephone Encounter (Signed)
Requested medication (s) are due for refill today:   Yes ? ?Requested medication (s) are on the active medication list:   Yes ? ?Future visit scheduled:   No ? ? ?Last ordered: 01/27/2021 60 each, 3 refills ? ?Returned because no protocol assigned to this medication  ? ?Requested Prescriptions  ?Pending Prescriptions Disp Refills  ? TRELEGY ELLIPTA 100-62.5-25 MCG/ACT AEPB [Pharmacy Med Name: TRELEGY ELLIPTA 100-62.5MCG INH 30P] 60 each 3  ?  Sig: INHALE 1 PUFF BY MOUTH DAILY  ?  ? There is no refill protocol information for this order  ?  ? ?

## 2021-06-28 ENCOUNTER — Other Ambulatory Visit: Payer: Self-pay | Admitting: Family Medicine

## 2021-06-28 DIAGNOSIS — J449 Chronic obstructive pulmonary disease, unspecified: Secondary | ICD-10-CM

## 2021-06-28 NOTE — Telephone Encounter (Signed)
Requested medication (s) are due for refill today: yes  Requested medication (s) are on the active medication list: yes  Last refill:  06/01/21  Future visit scheduled: no  Notes to clinic:  pt is due for appt. LVMTCB to schedule      Requested Prescriptions  Pending Prescriptions Disp Refills   TRELEGY ELLIPTA 100-62.5-25 MCG/ACT AEPB [Pharmacy Med Name: TRELEGY ELLIPTA 100-62.5MCG INH 30P] 60 each 0    Sig: INHALE 1 PUFF INTO THE LUNGS DAILY     There is no refill protocol information for this order

## 2021-06-28 NOTE — Telephone Encounter (Signed)
Patient called, left VM to return the call to the office to scheduled an appt for medication refill request.   

## 2021-07-04 ENCOUNTER — Other Ambulatory Visit: Payer: Self-pay

## 2021-07-04 DIAGNOSIS — J449 Chronic obstructive pulmonary disease, unspecified: Secondary | ICD-10-CM

## 2021-07-04 NOTE — Telephone Encounter (Signed)
Requested medication (s) are due for refill today: yes  Requested medication (s) are on the active medication list: yes  Last refill:  06/01/21 #60   Future visit scheduled: yes- in 3 weeks   Notes to clinic:  medication not assigned to a protocol   Requested Prescriptions  Pending Prescriptions Disp Refills   Fluticasone-Umeclidin-Vilant (TRELEGY ELLIPTA) 100-62.5-25 MCG/ACT AEPB 60 each 0    Sig: Inhale 1 puff into the lungs daily. NEEDS OV FOR FURTHER REFILLS     Off-Protocol Failed - 07/04/2021 11:05 AM      Failed - Medication not assigned to a protocol, review manually.      Failed - Valid encounter within last 12 months    Recent Outpatient Visits           12 months ago Controlled type 2 diabetes mellitus with complication, without long-term current use of insulin ([place])   [place] Pickard, Cammie Mcgee, MD   1 year ago Controlled type 2 diabetes mellitus with complication, without long-term current use of insulin ([place])   [place] Susy Frizzle, MD   2 years ago Controlled type 2 diabetes mellitus with complication, without long-term current use of insulin ([place])   [place] Susy Frizzle, MD   3 years ago Controlled type 2 diabetes mellitus with complication, without long-term current use of insulin ([place])   [place] Pickard, Cammie Mcgee, MD   5 years ago Controlled type 2 diabetes mellitus with complication, without long-term current use of insulin ([place])   [place] Pickard, Cammie Mcgee, MD       Future Appointments             In 3 weeks Pickard, Cammie Mcgee, MD [place]

## 2021-07-04 NOTE — Telephone Encounter (Signed)
Pt called in stating that he would like to get a refill of this med Fluticasone-Umeclidin-Vilant (TRELEGY ELLIPTA) 100-62.5-25 MCG/ACT AEPB [[id number]]. Pt has an appt scheduled for 7/13. Pt would like to know if he could get a courtesy refill of this med to last him until this appt date sent to the [facility] in [place]. Please advise.  Cb#: [phone]

## 2021-07-06 ENCOUNTER — Other Ambulatory Visit: Payer: Self-pay

## 2021-07-06 DIAGNOSIS — J449 Chronic obstructive pulmonary disease, unspecified: Secondary | ICD-10-CM

## 2021-07-06 MED ORDER — TRELEGY ELLIPTA 100-62.5-25 MCG/ACT IN AEPB: 1.0000 | INHALATION_SPRAY | Freq: Every day | RESPIRATORY_TRACT | 0 refills | Status: DC

## 2021-07-15 ENCOUNTER — Other Ambulatory Visit: Payer: Self-pay | Admitting: Family Medicine

## 2021-07-17 NOTE — Telephone Encounter (Signed)
Bother refilled 03/2021 - 6 month supply Requested Prescriptions  Pending Prescriptions Disp Refills  . pioglitazone (ACTOS) 15 MG tablet [Pharmacy Med Name: PIOGLITAZONE '15MG'$  TABLETS] 90 tablet 1    Sig: TAKE 1 TABLET BY MOUTH DAILY     Endocrinology:  Diabetes - Glitazones - pioglitazone Failed - 07/15/2021  9:12 AM      Failed - HBA1C is between 0 and 7.9 and within 180 days    Hgb A1c MFr Bld  Date Value Ref Range Status  06/23/2020 6.2 (H) <5.7 % of total Hgb Final    Comment:    For someone without known diabetes, a hemoglobin  A1c value between 5.7% and 6.4% is consistent with prediabetes and should be confirmed with a  follow-up test. . For someone with known diabetes, a value <7% indicates that their diabetes is well controlled. A1c targets should be individualized based on duration of diabetes, age, comorbid conditions, and other considerations. . This assay result is consistent with an increased risk of diabetes. . Currently, no consensus exists regarding use of hemoglobin A1c for diagnosis of diabetes for children. .          Failed - Valid encounter within last 6 months    Recent Outpatient Visits          1 year ago Controlled type 2 diabetes mellitus with complication, without long-term current use of insulin ([place])   [place] Pickard, Cammie Mcgee, MD   1 year ago Controlled type 2 diabetes mellitus with complication, without long-term current use of insulin ([place])   [place] Susy Frizzle, MD   2 years ago Controlled type 2 diabetes mellitus with complication, without long-term current use of insulin ([place])   [place] Susy Frizzle, MD   3 years ago Controlled type 2 diabetes mellitus with complication, without long-term current use of insulin ([place])   [place] Pickard, Cammie Mcgee, MD   5 years ago Controlled type 2 diabetes mellitus with complication, without long-term current use  of insulin ([place])   [place] Pickard, Cammie Mcgee, MD      Future Appointments            In 1 week Pickard, Cammie Mcgee, MD [place]           . simvastatin (ZOCOR) 40 MG tablet [Pharmacy Med Name: SIMVASTATIN '40MG'$  TABLETS] 90 tablet 1    Sig: TAKE 1 TABLET BY MOUTH EVERY EVENING     Cardiovascular:  Antilipid - Statins Failed - 07/15/2021  9:12 AM      Failed - Valid encounter within last 12 months    Recent Outpatient Visits          1 year ago Controlled type 2 diabetes mellitus with complication, without long-term current use of insulin ([place])   [place] Pickard, Cammie Mcgee, MD   1 year ago Controlled type 2 diabetes mellitus with complication, without long-term current use of insulin ([place])   [place] Susy Frizzle, MD   2 years ago Controlled type 2 diabetes mellitus with complication, without long-term current use of insulin ([place])   [place] Susy Frizzle, MD   3 years ago Controlled type 2 diabetes mellitus with complication, without long-term current use of insulin ([place])   [facility] Susy Frizzle, MD   5 years ago Controlled type 2 diabetes  mellitus with complication, without long-term current use of insulin ([place])   [place] Pickard, Cammie Mcgee, MD      Future Appointments            In 1 week Pickard, Cammie Mcgee, MD [place]           Failed - Lipid Panel in normal range within the last 12 months    Cholesterol  Date Value Ref Range Status  06/23/2020 123 <200 mg/dL Final   LDL Cholesterol (Calc)  Date Value Ref Range Status  06/23/2020 56 mg/dL (calc) Final    Comment:    Reference range: <100 . Desirable range <100 mg/dL for primary prevention;   <70 mg/dL for patients with CHD or diabetic patients  with > or = 2 CHD risk factors. Marland Kitchen LDL-C is now calculated using the Martin-Hopkins   calculation, which is a validated novel method providing  better accuracy than the Friedewald equation in the  estimation of LDL-C.  Cresenciano Genre et al. Annamaria Helling. 1157;262(03): 2061-2068  (http://education.[facility].com/faq/FAQ164)    HDL  Date Value Ref Range Status  06/23/2020 48 > OR = 40 mg/dL Final   Triglycerides  Date Value Ref Range Status  06/23/2020 106 <150 mg/dL Final         Passed - Patient is not pregnant

## 2021-07-22 ENCOUNTER — Other Ambulatory Visit: Payer: Self-pay | Admitting: Family Medicine

## 2021-07-24 NOTE — Telephone Encounter (Signed)
Requested Prescriptions  Pending Prescriptions Disp Refills  . cloNIDine (CATAPRES) 0.3 MG tablet [Pharmacy Med Name: CLONIDINE 0.'3MG'$  TABLETS] 180 tablet 0    Sig: TAKE 1 TABLET(0.3 MG) BY MOUTH TWICE DAILY     Cardiovascular:  Alpha-2 Agonists Failed - 07/22/2021  3:08 AM      Failed - Valid encounter within last 6 months    Recent Outpatient Visits          1 year ago Controlled type 2 diabetes mellitus with complication, without long-term current use of insulin ([place])   [place] Pickard, Cammie Mcgee, MD   1 year ago Controlled type 2 diabetes mellitus with complication, without long-term current use of insulin ([place])   [place] Susy Frizzle, MD   3 years ago Controlled type 2 diabetes mellitus with complication, without long-term current use of insulin ([place])   [place] Pickard, Cammie Mcgee, MD   3 years ago Controlled type 2 diabetes mellitus with complication, without long-term current use of insulin ([place])   [place] Pickard, Cammie Mcgee, MD   5 years ago Controlled type 2 diabetes mellitus with complication, without long-term current use of insulin ([place])   [place] Pickard, Cammie Mcgee, MD      Future Appointments            In 3 days Pickard, Cammie Mcgee, MD [place], [facility]           Passed - Last BP in normal range    BP Readings from Last 1 Encounters:  07/08/20 136/88         Passed - Last Heart Rate in normal range    Pulse Readings from Last 1 Encounters:  07/08/20 70

## 2021-07-27 ENCOUNTER — Other Ambulatory Visit: Payer: Self-pay | Admitting: Family Medicine

## 2021-07-27 ENCOUNTER — Ambulatory Visit (INDEPENDENT_AMBULATORY_CARE_PROVIDER_SITE_OTHER): Payer: Medicare Other | Admitting: Family Medicine

## 2021-07-27 VITALS — BP 120/62 | HR 62 | Temp 97.7°F | Ht 66.0 in | Wt 229.0 lb

## 2021-07-27 DIAGNOSIS — I10 Essential (primary) hypertension: Secondary | ICD-10-CM | POA: Diagnosis not present

## 2021-07-27 DIAGNOSIS — N183 Chronic kidney disease, stage 3 unspecified: Secondary | ICD-10-CM | POA: Diagnosis not present

## 2021-07-27 DIAGNOSIS — Z720 Tobacco use: Secondary | ICD-10-CM | POA: Diagnosis not present

## 2021-07-27 DIAGNOSIS — E7800 Pure hypercholesterolemia, unspecified: Secondary | ICD-10-CM

## 2021-07-27 DIAGNOSIS — E118 Type 2 diabetes mellitus with unspecified complications: Secondary | ICD-10-CM | POA: Diagnosis not present

## 2021-07-27 MED ORDER — DOXYCYCLINE HYCLATE 50 MG PO CAPS: 50.0000 mg | ORAL_CAPSULE | Freq: Two times a day (BID) | ORAL | 0 refills | Status: DC

## 2021-07-27 NOTE — Progress Notes (Signed)
Subjective:    Patient ID: Harry James, male    DOB: 1950/07/24, 71 y.o.   MRN: [id number]  Last visit (6/22), my plan was: I am concerned about the rise in his creatinine.  I recommended the patient decrease his furosemide by 50% and try to drink more fluid and then we recheck a BMP in 2 weeks.  If still elevated at that point I would recommend a renal ultrasound, SPEP, and urinalysis.  Blood pressure today is well controlled.  I will add doxazosin 2 mg a day for urinary retention and BPH symptoms.  Cholesterol is acceptable.  A1c is outstanding.  I congratulated the patient on otherwise good lab work aside from the elevation in his creatinine.  Recheck renal function in 2 weeks  Patient has not followed up since.  Last cr was 1.72 (6/22).   Wt Readings from Last 3 Encounters:  07/27/21 229 lb (103.9 kg)  07/08/20 239 lb (108.4 kg)  07/27/19 234 lb (106.1 kg)   Patient states that he has stopped driving.  As result, he is having his groceries delivered to the house.  He believes that he is eating much better and this is what explains the 10 pound weight loss since his last visit.  He believes his 10 pound weight loss has occurred gradually.  He denies any chest pain or shortness of breath or hemoptysis or abdominal pain or fevers or chills.  He has also drastically reduced his smoking.  He is using his Lasix intermittently as needed.  He anticipates that he takes Lasix 3-4 times a week.  Today there is no swelling on his exam.  I reiterated my concern regarding his kidney function.  I do not want him taking any NSAIDs.  He denies taking any.  He is not checking his blood pressure or his blood sugars at home.  Past Medical History:  Diagnosis Date   Chronic kidney disease    has one functional kidney   Diabetes mellitus    prediabetes   Hyperlipidemia    Hypertension    Polycythemia, secondary 01/25/2014   Tobacco abuse    Ulcer    perforated duodenal ulcer 12/12   Past Surgical  History:  Procedure Laterality Date   CYSTECTOMY  under tongue   LAPAROTOMY  12/16/2010   [name] patch perf du Procedure: EXPLORATORY LAPAROTOMY;  Surgeon: [name], MD;  Location: [facility] OR;  Service: General;  Laterality: N/A;   Current Outpatient Medications on File Prior to Visit  Medication Sig Dispense Refill   traMADol (ULTRAM) 50 MG tablet TAKE 2 TABLETS(100 MG) BY MOUTH FOUR TIMES DAILY 60 tablet 0   albuterol (VENTOLIN HFA) 108 (90 Base) MCG/ACT inhaler INHALE 2 PUFFS INTO THE LUNGS EVERY 6 HOURS AS NEEDED FOR WHEEZING OR SHORTNESS OF BREATH 8.5 g 1   cloNIDine (CATAPRES) 0.3 MG tablet TAKE 1 TABLET(0.3 MG) BY MOUTH TWICE DAILY 180 tablet 0   doxazosin (CARDURA) 2 MG tablet TAKE 1 TABLET(2 MG) BY MOUTH DAILY 90 tablet 3   Fish Oil OIL by Does not apply route. 2 po qhs     Fluticasone-Umeclidin-Vilant (TRELEGY ELLIPTA) 100-62.5-25 MCG/ACT AEPB Inhale 1 puff into the lungs daily. NEEDS OV FOR FURTHER REFILLS 60 each 0   furosemide (LASIX) 80 MG tablet Take 1 tablet (80 mg total) by mouth daily. 90 tablet 3   metoprolol tartrate (LOPRESSOR) 100 MG tablet Take 1 tablet by mouth twice daily. 180 tablet 2   Multiple Vitamins-Minerals (MULTIVITAMINS THER.  W/MINERALS) TABS Take 1 tablet by mouth daily. 30 each    omeprazole (PRILOSEC) 40 MG capsule TAKE 1 CAPSULE(40 MG) BY MOUTH DAILY 90 capsule 3   oxyCODONE-acetaminophen (PERCOCET) 7.5-325 MG tablet Take 1 tablet by mouth every 4 (four) hours as needed for severe pain. 30 tablet 0   pioglitazone (ACTOS) 15 MG tablet TAKE 1 TABLET BY MOUTH DAILY 90 tablet 1   simvastatin (ZOCOR) 40 MG tablet TAKE 1 TABLET BY MOUTH EVERY EVENING 90 tablet 1   valsartan (DIOVAN) 160 MG tablet TAKE 1 TABLET(160 MG) BY MOUTH DAILY 90 tablet 3   No current facility-administered medications on file prior to visit.   No Known Allergies Social History   Socioeconomic History   Marital status: Divorced    Spouse name: Not on file   Number of children:  Not on file   Years of education: Not on file   Highest education level: Not on file  Occupational History   Not on file  Tobacco Use   Smoking status: Every Day    Packs/day: 1.00    Types: Cigarettes   Smokeless tobacco: Never  Substance and Sexual Activity   Alcohol use: Yes    Comment: daily   Drug use: No   Sexual activity: Not on file  Other Topics Concern   Not on file  Social History Narrative   Not on file   Social Determinants of Health   Financial Resource Strain: Low Risk  (07/01/2020)   Overall Financial Resource Strain (CARDIA)    Difficulty of Paying Living Expenses: Not hard at all  Food Insecurity: No Food Insecurity (07/01/2020)   Hunger Vital Sign    Worried About Running Out of Food in the Last Year: Never true    [place] in the Last Year: Never true  Transportation Needs: No Transportation Needs (07/01/2020)   PRAPARE - Hydrologist (Medical): No    Lack of Transportation (Non-Medical): No  Physical Activity: Inactive (07/01/2020)   Exercise Vital Sign    Days of Exercise per Week: 0 days    Minutes of Exercise per Session: 0 min  Stress: No Stress Concern Present (07/01/2020)   [place]    Feeling of Stress : Not at all  Social Connections: Socially Isolated (07/01/2020)   Social Connection and Isolation Panel [NHANES]    Frequency of Communication with Friends and Family: Twice a week    Frequency of Social Gatherings with Friends and Family: Once a week    Attends Religious Services: Never    Marine scientist or Organizations: No    Attends Archivist Meetings: Never    Marital Status: Divorced  Human resources officer Violence: Not At Risk (07/01/2020)   Humiliation, Afraid, Rape, and Kick questionnaire    Fear of Current or Ex-Partner: No    Emotionally Abused: No    Physically Abused: No    Sexually Abused: No   No family history  on file.    Review of Systems  All other systems reviewed and are negative.      Objective:   Physical Exam Vitals reviewed.  Constitutional:      General: He is not in acute distress.    Appearance: He is well-developed. He is obese. He is not ill-appearing or toxic-appearing.  HENT:     Right Ear: Tympanic membrane and ear canal normal.     Left Ear: Tympanic  membrane and ear canal normal.     Nose: Nose normal. No congestion or rhinorrhea.  Eyes:     Extraocular Movements: Extraocular movements intact.     Conjunctiva/sclera: Conjunctivae normal.     Pupils: Pupils are equal, round, and reactive to light.  Neck:     Thyroid: No thyromegaly.     Vascular: No JVD.  Cardiovascular:     Rate and Rhythm: Normal rate and regular rhythm.     Heart sounds: Normal heart sounds. No murmur heard.    No friction rub. No gallop.  Pulmonary:     Effort: Pulmonary effort is normal. No respiratory distress.     Breath sounds: Decreased air movement present. Decreased breath sounds and wheezing present. No rales.  Chest:     Chest wall: No tenderness.  Abdominal:     General: Bowel sounds are normal. There is no distension.     Palpations: Abdomen is soft. There is no mass.     Tenderness: There is no abdominal tenderness. There is no guarding or rebound.  Musculoskeletal:     Right lower leg: No edema.     Left lower leg: No edema.  Skin:    Findings: No erythema or rash.  Neurological:     General: No focal deficit present.     Mental Status: He is alert and oriented to person, place, and time.     Cranial Nerves: No cranial nerve deficit.     Sensory: No sensory deficit.     Motor: No weakness or abnormal muscle tone.     Gait: Gait normal.  Psychiatric:        Mood and Affect: Mood normal.        Behavior: Behavior normal.        Thought Content: Thought content normal.        Judgment: Judgment normal.    Patient has a papulopustular rash on both cheeks and on the  bridge of his nose.  This is described as 2 to 3 mm papules that are erythematous and and small pustules.  I suspect that the patient likely has rosacea.       Assessment & Plan:  Controlled type 2 diabetes mellitus with complication, without long-term current use of insulin ([place]) - Plan: CBC with Differential/Platelet, Lipid panel, Microalbumin, urine, Hemoglobin A1c, COMPLETE METABOLIC PANEL WITH GFR, PSA  Pure hypercholesterolemia - Plan: CBC with Differential/Platelet, Lipid panel, Microalbumin, urine, Hemoglobin A1c, COMPLETE METABOLIC PANEL WITH GFR, PSA  Essential hypertension - Plan: CBC with Differential/Platelet, Lipid panel, Microalbumin, urine, Hemoglobin A1c, COMPLETE METABOLIC PANEL WITH GFR, PSA  Stage 3 chronic kidney disease, unspecified whether stage 3a or 3b CKD (HCC) - Plan: CBC with Differential/Platelet, Lipid panel, Microalbumin, urine, Hemoglobin A1c, COMPLETE METABOLIC PANEL WITH GFR, PSA  Tobacco abuse Patient never followed up regarding his kidney function.  I am concerned what his creatinine may be today.  I emphasized the patient should not be taking any NSAID.  Also emphasized to him that I want him to stop taking Lasix and use it sparingly only if absolutely necessary for leg swelling.  Obtain a CBC, CMP, lipid panel, A1c, urine microalbumin.  Goal A1c is less than 6.5.  Goal LDL cholesterol is less than 100.  If renal function is worsening I want to get a renal ultrasound.  I would also likely suggest switching the patient to [facility] for renal protection.  Start the patient on doxycycline 50 mg p.o. twice daily for 2 weeks  for rosacea.  If improving I would like to switch the patient to topical MetroGel.  Patient is overdue for colonoscopy as well as lung cancer screening and AAA screening.  He defers these at the present time due to difficulties with transportation.

## 2021-07-27 NOTE — Telephone Encounter (Signed)
Requested medication (s) are due for refill today:yes  Requested medication (s) are on the active medication list: yes    Last refill: 05/18/21  8.5G  1 refill  Future visit scheduled yes  Notes to clinic: Pt has appt today, please review. Thank you.  Requested Prescriptions  Pending Prescriptions Disp Refills   albuterol (VENTOLIN HFA) 108 (90 Base) MCG/ACT inhaler [Pharmacy Med Name: ALBUTEROL HFA INH (200 PUFFS) 8.5GM] 8.5 g 1    Sig: INHALE 2 PUFFS INTO THE LUNGS EVERY 6 HOURS AS NEEDED FOR WHEEZING OR SHORTNESS OF BREATH     Pulmonology:  Beta Agonists 2 Failed - 07/27/2021  3:08 AM      Failed - Valid encounter within last 12 months    Recent Outpatient Visits           1 year ago Controlled type 2 diabetes mellitus with complication, without long-term current use of insulin ([place])   [place] [facility], Cammie Mcgee, MD   2 years ago Controlled type 2 diabetes mellitus with complication, without long-term current use of insulin ([place])   [place] [facility], Cammie Mcgee, MD   3 years ago Controlled type 2 diabetes mellitus with complication, without long-term current use of insulin ([place])   [place] [facility], Cammie Mcgee, MD   3 years ago Controlled type 2 diabetes mellitus with complication, without long-term current use of insulin ([place])   [place] [facility], Cammie Mcgee, MD   5 years ago Controlled type 2 diabetes mellitus with complication, without long-term current use of insulin ([place])   [place] [facility], Cammie Mcgee, MD       Future Appointments             Today Susy Frizzle, MD [place]            Passed - Last BP in normal range    BP Readings from Last 1 Encounters:  07/08/20 136/88         Passed - Last Heart Rate in normal range    Pulse Readings from Last 1 Encounters:  07/08/20 70

## 2021-07-28 LAB — LIPID PANEL
Cholesterol: 107 mg/dL (ref <200)
HDL: 67 mg/dL (ref >=40)
LDL Cholesterol (Calc): 25 mg/dL (calc)
Non-HDL Cholesterol (Calc): 40 mg/dL (calc) (ref <130)
Total CHOL/HDL Ratio: 1.6 (calc) (ref <5.0)
Triglycerides: 70 mg/dL (ref <150)

## 2021-07-28 LAB — CBC WITH DIFFERENTIAL/PLATELET
Absolute Monocytes: 707 cells/uL (ref 200–950)
Basophils Absolute: 37 cells/uL (ref 0–200)
Basophils Relative: 0.4 %
Eosinophils Absolute: 288 cells/uL (ref 15–500)
Eosinophils Relative: 3.1 %
HCT: 35.7 % — ABNORMAL LOW (ref 38.5–50.0)
Hemoglobin: 12.1 g/dL — ABNORMAL LOW (ref 13.2–17.1)
Lymphs Abs: 902 cells/uL (ref 850–3900)
MCH: 33.1 pg — ABNORMAL HIGH (ref 27.0–33.0)
MCHC: 33.9 g/dL (ref 32.0–36.0)
MCV: 97.5 fL (ref 80.0–100.0)
MPV: 9.9 fL (ref 7.5–12.5)
Monocytes Relative: 7.6 %
Neutro Abs: 7366 cells/uL (ref 1500–7800)
Neutrophils Relative %: 79.2 %
Platelets: 160 10*3/uL (ref 140–400)
RBC: 3.66 10*6/uL — ABNORMAL LOW (ref 4.20–5.80)
RDW: 13.4 % (ref 11.0–15.0)
Total Lymphocyte: 9.7 %
WBC: 9.3 10*3/uL (ref 3.8–10.8)

## 2021-07-28 LAB — COMPLETE METABOLIC PANEL WITH GFR
AG Ratio: 1.6 (calc) (ref 1.0–2.5)
ALT: 20 U/L (ref 9–46)
AST: 18 U/L (ref 10–35)
Albumin: 4.1 g/dL (ref 3.6–5.1)
Alkaline phosphatase (APISO): 169 U/L — ABNORMAL HIGH (ref 35–144)
BUN/Creatinine Ratio: 31 (calc) — ABNORMAL HIGH (ref 6–22)
BUN: 56 mg/dL — ABNORMAL HIGH (ref 7–25)
CO2: 24 mmol/L (ref 20–32)
Calcium: 8.7 mg/dL (ref 8.6–10.3)
Chloride: 102 mmol/L (ref 98–110)
Creat: 1.78 mg/dL — ABNORMAL HIGH (ref 0.70–1.28)
Globulin: 2.6 g/dL (calc) (ref 1.9–3.7)
Glucose, Bld: 107 mg/dL — ABNORMAL HIGH (ref 65–99)
Potassium: 4.8 mmol/L (ref 3.5–5.3)
Sodium: 136 mmol/L (ref 135–146)
Total Bilirubin: 0.6 mg/dL (ref 0.2–1.2)
Total Protein: 6.7 g/dL (ref 6.1–8.1)
eGFR: 40 mL/min/{1.73_m2} — ABNORMAL LOW (ref >=60)

## 2021-07-28 LAB — PSA: PSA: 1.3 ng/mL (ref <=4.00)

## 2021-07-28 LAB — MICROALBUMIN, URINE: Microalb, Ur: 0.4 mg/dL

## 2021-07-28 LAB — HEMOGLOBIN A1C
Hgb A1c MFr Bld: 5.7 % of total Hgb — ABNORMAL HIGH (ref <5.7)
Mean Plasma Glucose: 117 mg/dL
eAG (mmol/L): 6.5 mmol/L

## 2021-08-03 ENCOUNTER — Telehealth: Payer: Self-pay

## 2021-08-03 ENCOUNTER — Other Ambulatory Visit: Payer: Self-pay

## 2021-08-03 DIAGNOSIS — N183 Chronic kidney disease, stage 3 unspecified: Secondary | ICD-10-CM

## 2021-08-03 DIAGNOSIS — N289 Disorder of kidney and ureter, unspecified: Secondary | ICD-10-CM

## 2021-08-03 DIAGNOSIS — J449 Chronic obstructive pulmonary disease, unspecified: Secondary | ICD-10-CM

## 2021-08-03 MED ORDER — TRELEGY ELLIPTA 100-62.5-25 MCG/ACT IN AEPB: 1.0000 | INHALATION_SPRAY | Freq: Every day | RESPIRATORY_TRACT | 0 refills | Status: DC

## 2021-08-03 MED ORDER — ALBUTEROL SULFATE HFA 108 (90 BASE) MCG/ACT IN AERS
2.0000 | INHALATION_SPRAY | Freq: Four times a day (QID) | RESPIRATORY_TRACT | 1 refills | Status: AC | PRN
Start: 2021-08-03 — End: ?

## 2021-08-03 NOTE — Telephone Encounter (Signed)
Pt aware of lab results, voiced understanding. However, Pt feels that he will no be able do the renal US if its in [place] due transportation issues. Per pt if we can get the [place] done at [facility], he will try to go there since it is closer.  Renal US ordered placed and labs

## 2021-08-08 ENCOUNTER — Other Ambulatory Visit: Payer: Self-pay | Admitting: Family Medicine

## 2021-08-08 DIAGNOSIS — J449 Chronic obstructive pulmonary disease, unspecified: Secondary | ICD-10-CM

## 2021-08-17 ENCOUNTER — Other Ambulatory Visit: Payer: Medicare Other

## 2021-08-24 ENCOUNTER — Other Ambulatory Visit: Payer: Medicare Other

## 2021-08-24 DIAGNOSIS — E119 Type 2 diabetes mellitus without complications: Secondary | ICD-10-CM

## 2021-08-24 DIAGNOSIS — E7800 Pure hypercholesterolemia, unspecified: Secondary | ICD-10-CM

## 2021-08-24 DIAGNOSIS — N183 Chronic kidney disease, stage 3 unspecified: Secondary | ICD-10-CM

## 2021-08-25 LAB — PROTEIN / CREATININE RATIO, URINE
Creatinine, Urine: 95 mg/dL (ref 20–320)
Protein/Creat Ratio: 411 mg/g creat — ABNORMAL HIGH (ref 25–148)
Protein/Creatinine Ratio: 0.411 mg/mg creat — ABNORMAL HIGH (ref 0.025–0.148)
Total Protein, Urine: 39 mg/dL — ABNORMAL HIGH (ref 5–25)

## 2021-08-28 ENCOUNTER — Other Ambulatory Visit: Payer: Self-pay

## 2021-08-28 MED ORDER — DAPAGLIFLOZIN PROPANEDIOL 10 MG PO TABS: 10.0000 mg | ORAL_TABLET | Freq: Every day | ORAL | 1 refills | Status: DC

## 2021-08-30 ENCOUNTER — Other Ambulatory Visit: Payer: Self-pay | Admitting: Family Medicine

## 2021-08-30 LAB — PROTEIN ELECTROPHORESIS, SERUM
Albumin ELP: 3.6 g/dL — ABNORMAL LOW (ref 3.8–4.8)
Alpha 1: 0.3 g/dL (ref 0.2–0.3)
Alpha 2: 0.5 g/dL (ref 0.5–0.9)
Beta 2: 0.3 g/dL (ref 0.2–0.5)
Beta Globulin: 0.4 g/dL (ref 0.4–0.6)
Gamma Globulin: 1 g/dL (ref 0.8–1.7)
Total Protein: 6.1 g/dL (ref 6.1–8.1)

## 2021-08-31 NOTE — Telephone Encounter (Signed)
Requested medication (s) are due for refill today: yes  Requested medication (s) are on the active medication list: yes  Last refill:  04/04/21  Future visit scheduled:no  Notes to clinic:  Unable to refill per protocol, cannot delegate.       Requested Prescriptions  Pending Prescriptions Disp Refills   traMADol (ULTRAM) 50 MG tablet [Pharmacy Med Name: TRAMADOL '50MG'$  TABLETS] 60 tablet     Sig: TAKE 2 TABLETS(100 MG) BY MOUTH FOUR TIMES DAILY     Not Delegated - Analgesics:  Opioid Agonists Failed - 08/30/2021  3:43 PM      Failed - This refill cannot be delegated      Failed - Urine Drug Screen completed in last 360 days      Failed - Valid encounter within last 3 months    Recent Outpatient Visits           1 year ago Controlled type 2 diabetes mellitus with complication, without long-term current use of insulin ([place])   [place] [facility], Cammie Mcgee, MD   2 years ago Controlled type 2 diabetes mellitus with complication, without long-term current use of insulin ([place])   [place] [facility], Cammie Mcgee, MD   3 years ago Controlled type 2 diabetes mellitus with complication, without long-term current use of insulin ([place])   [place] [facility], Cammie Mcgee, MD   4 years ago Controlled type 2 diabetes mellitus with complication, without long-term current use of insulin ([place])   [place] [facility], Cammie Mcgee, MD   5 years ago Controlled type 2 diabetes mellitus with complication, without long-term current use of insulin ([place])   [facility], Cammie Mcgee, MD

## 2021-09-05 ENCOUNTER — Other Ambulatory Visit: Payer: Self-pay

## 2021-09-05 DIAGNOSIS — N183 Chronic kidney disease, stage 3 unspecified: Secondary | ICD-10-CM

## 2021-09-05 DIAGNOSIS — N289 Disorder of kidney and ureter, unspecified: Secondary | ICD-10-CM

## 2021-09-05 DIAGNOSIS — E119 Type 2 diabetes mellitus without complications: Secondary | ICD-10-CM

## 2021-09-05 MED ORDER — DAPAGLIFLOZIN PROPANEDIOL 10 MG PO TABS: 10.0000 mg | ORAL_TABLET | Freq: Every day | ORAL | 1 refills | Status: DC

## 2021-09-15 ENCOUNTER — Other Ambulatory Visit: Payer: Self-pay | Admitting: Family Medicine

## 2021-10-23 ENCOUNTER — Other Ambulatory Visit: Payer: Self-pay | Admitting: Family Medicine

## 2021-10-23 DIAGNOSIS — E119 Type 2 diabetes mellitus without complications: Secondary | ICD-10-CM

## 2021-10-23 DIAGNOSIS — N289 Disorder of kidney and ureter, unspecified: Secondary | ICD-10-CM

## 2021-10-23 DIAGNOSIS — N183 Chronic kidney disease, stage 3 unspecified: Secondary | ICD-10-CM

## 2021-10-24 ENCOUNTER — Other Ambulatory Visit: Payer: Self-pay | Admitting: Family Medicine

## 2021-10-24 DIAGNOSIS — N289 Disorder of kidney and ureter, unspecified: Secondary | ICD-10-CM

## 2021-10-24 DIAGNOSIS — N183 Chronic kidney disease, stage 3 unspecified: Secondary | ICD-10-CM

## 2021-10-24 DIAGNOSIS — E119 Type 2 diabetes mellitus without complications: Secondary | ICD-10-CM

## 2021-10-24 NOTE — Telephone Encounter (Signed)
Unable to refill per protocol, last refill by provider 10/23/21 for 90 and 1.E-Prescribing Status: Receipt confirmed by pharmacy (10/23/2021 2:33 PM EDT). Will refuse.  Requested Prescriptions  Pending Prescriptions Disp Refills  . FARXIGA 10 MG TABS tablet [Pharmacy Med Name: FARXIGA $RemoveBefor'10MG'gyoAClioSAjc$  TABLETS] 30 tablet 1    Sig: TAKE 1 TABLET(10 MG) BY MOUTH DAILY BEFORE BREAKFAST     Endocrinology:  Diabetes - SGLT2 Inhibitors Failed - 10/24/2021  3:08 AM      Failed - Cr in normal range and within 360 days    Creat  Date Value Ref Range Status  07/27/2021 1.78 (H) 0.70 - 1.28 mg/dL Final   Creatinine, Urine  Date Value Ref Range Status  08/24/2021 95 20 - 320 mg/dL Final         Failed - eGFR in normal range and within 360 days    GFR, Est African American  Date Value Ref Range Status  06/23/2020 45 (L) > OR = 60 mL/min/1.61m2 Final   GFR, Est Non African American  Date Value Ref Range Status  06/23/2020 39 (L) > OR = 60 mL/min/1.69m2 Final   eGFR  Date Value Ref Range Status  07/27/2021 40 (L) > OR = 60 mL/min/1.33m2 Final    Comment:    The eGFR is based on the CKD-EPI 2021 equation. To calculate  the new eGFR from a previous Creatinine or Cystatin C result, go to https://www.kidney.org/professionals/ kdoqi/gfr%5Fcalculator          Failed - Valid encounter within last 6 months    Recent Outpatient Visits          1 year ago Controlled type 2 diabetes mellitus with complication, without long-term current use of insulin ([place])   [place] [facility], Cammie Mcgee, MD   2 years ago Controlled type 2 diabetes mellitus with complication, without long-term current use of insulin ([place])   [place] [facility], Cammie Mcgee, MD   3 years ago Controlled type 2 diabetes mellitus with complication, without long-term current use of insulin ([place])   [place] [facility], Cammie Mcgee, MD   4 years ago Controlled type 2 diabetes mellitus with complication,  without long-term current use of insulin ([place])   [place] Pickard, Cammie Mcgee, MD   5 years ago Controlled type 2 diabetes mellitus with complication, without long-term current use of insulin ([place])   [facility] Pickard, Cammie Mcgee, MD             Passed - HBA1C is between 0 and 7.9 and within 180 days    Hgb A1c MFr Bld  Date Value Ref Range Status  07/27/2021 5.7 (H) <5.7 % of total Hgb Final    Comment:    For someone without known diabetes, a hemoglobin  A1c value between 5.7% and 6.4% is consistent with prediabetes and should be confirmed with a  follow-up test. . For someone with known diabetes, a value <7% indicates that their diabetes is well controlled. A1c targets should be individualized based on duration of diabetes, age, comorbid conditions, and other considerations. . This assay result is consistent with an increased risk of diabetes. . Currently, no consensus exists regarding use of hemoglobin A1c for diagnosis of diabetes for children. Marland Kitchen

## 2021-10-26 ENCOUNTER — Other Ambulatory Visit: Payer: Self-pay

## 2021-10-26 DIAGNOSIS — N289 Disorder of kidney and ureter, unspecified: Secondary | ICD-10-CM

## 2021-10-26 DIAGNOSIS — E119 Type 2 diabetes mellitus without complications: Secondary | ICD-10-CM

## 2021-10-26 DIAGNOSIS — N183 Chronic kidney disease, stage 3 unspecified: Secondary | ICD-10-CM

## 2021-10-26 MED ORDER — DAPAGLIFLOZIN PROPANEDIOL 10 MG PO TABS: ORAL_TABLET | ORAL | 3 refills | Status: AC

## 2021-10-30 ENCOUNTER — Telehealth: Payer: Self-pay | Admitting: Family Medicine

## 2021-11-07 ENCOUNTER — Encounter (HOSPITAL_COMMUNITY): Payer: Self-pay

## 2021-11-07 ENCOUNTER — Emergency Department (HOSPITAL_COMMUNITY): Payer: Medicare Other

## 2021-11-07 ENCOUNTER — Encounter (HOSPITAL_COMMUNITY): Payer: Self-pay | Admitting: *Deleted

## 2021-11-07 ENCOUNTER — Inpatient Hospital Stay (HOSPITAL_COMMUNITY)
Admission: EM | Admit: 2021-11-07 | Discharge: 2021-11-15 | DRG: 175 | Disposition: E | Payer: Medicare Other | Attending: Internal Medicine | Admitting: Internal Medicine

## 2021-11-07 ENCOUNTER — Other Ambulatory Visit: Payer: Self-pay

## 2021-11-07 DIAGNOSIS — A419 Sepsis, unspecified organism: Principal | ICD-10-CM | POA: Diagnosis present

## 2021-11-07 DIAGNOSIS — I2609 Other pulmonary embolism with acute cor pulmonale: Secondary | ICD-10-CM | POA: Diagnosis not present

## 2021-11-07 DIAGNOSIS — F1010 Alcohol abuse, uncomplicated: Secondary | ICD-10-CM | POA: Diagnosis present

## 2021-11-07 DIAGNOSIS — E1165 Type 2 diabetes mellitus with hyperglycemia: Secondary | ICD-10-CM | POA: Diagnosis not present

## 2021-11-07 DIAGNOSIS — E1122 Type 2 diabetes mellitus with diabetic chronic kidney disease: Secondary | ICD-10-CM | POA: Diagnosis present

## 2021-11-07 DIAGNOSIS — I480 Paroxysmal atrial fibrillation: Secondary | ICD-10-CM | POA: Diagnosis not present

## 2021-11-07 DIAGNOSIS — R0902 Hypoxemia: Secondary | ICD-10-CM | POA: Diagnosis present

## 2021-11-07 DIAGNOSIS — R627 Adult failure to thrive: Secondary | ICD-10-CM | POA: Diagnosis present

## 2021-11-07 DIAGNOSIS — N170 Acute kidney failure with tubular necrosis: Secondary | ICD-10-CM | POA: Diagnosis not present

## 2021-11-07 DIAGNOSIS — Z66 Do not resuscitate: Secondary | ICD-10-CM | POA: Diagnosis not present

## 2021-11-07 DIAGNOSIS — Z7189 Other specified counseling: Secondary | ICD-10-CM | POA: Diagnosis not present

## 2021-11-07 DIAGNOSIS — I2729 Other secondary pulmonary hypertension: Secondary | ICD-10-CM | POA: Diagnosis not present

## 2021-11-07 DIAGNOSIS — E875 Hyperkalemia: Secondary | ICD-10-CM | POA: Diagnosis present

## 2021-11-07 DIAGNOSIS — G4700 Insomnia, unspecified: Secondary | ICD-10-CM | POA: Diagnosis present

## 2021-11-07 DIAGNOSIS — I5031 Acute diastolic (congestive) heart failure: Secondary | ICD-10-CM | POA: Diagnosis not present

## 2021-11-07 DIAGNOSIS — R0682 Tachypnea, not elsewhere classified: Secondary | ICD-10-CM | POA: Diagnosis present

## 2021-11-07 DIAGNOSIS — I7102 Dissection of abdominal aorta: Secondary | ICD-10-CM | POA: Diagnosis present

## 2021-11-07 DIAGNOSIS — R531 Weakness: Secondary | ICD-10-CM | POA: Diagnosis not present

## 2021-11-07 DIAGNOSIS — Z23 Encounter for immunization: Secondary | ICD-10-CM | POA: Diagnosis not present

## 2021-11-07 DIAGNOSIS — I129 Hypertensive chronic kidney disease with stage 1 through stage 4 chronic kidney disease, or unspecified chronic kidney disease: Secondary | ICD-10-CM | POA: Diagnosis not present

## 2021-11-07 DIAGNOSIS — R0602 Shortness of breath: Secondary | ICD-10-CM | POA: Diagnosis not present

## 2021-11-07 DIAGNOSIS — E662 Morbid (severe) obesity with alveolar hypoventilation: Secondary | ICD-10-CM | POA: Diagnosis present

## 2021-11-07 DIAGNOSIS — D649 Anemia, unspecified: Secondary | ICD-10-CM | POA: Diagnosis not present

## 2021-11-07 DIAGNOSIS — N1832 Chronic kidney disease, stage 3b: Secondary | ICD-10-CM | POA: Diagnosis not present

## 2021-11-07 DIAGNOSIS — I499 Cardiac arrhythmia, unspecified: Secondary | ICD-10-CM | POA: Diagnosis not present

## 2021-11-07 DIAGNOSIS — J439 Emphysema, unspecified: Secondary | ICD-10-CM | POA: Diagnosis not present

## 2021-11-07 DIAGNOSIS — T68XXXA Hypothermia, initial encounter: Secondary | ICD-10-CM | POA: Diagnosis not present

## 2021-11-07 DIAGNOSIS — E872 Acidosis, unspecified: Secondary | ICD-10-CM | POA: Diagnosis not present

## 2021-11-07 DIAGNOSIS — D696 Thrombocytopenia, unspecified: Secondary | ICD-10-CM | POA: Diagnosis not present

## 2021-11-07 DIAGNOSIS — J9811 Atelectasis: Secondary | ICD-10-CM | POA: Diagnosis not present

## 2021-11-07 DIAGNOSIS — R6520 Severe sepsis without septic shock: Secondary | ICD-10-CM

## 2021-11-07 DIAGNOSIS — K265 Chronic or unspecified duodenal ulcer with perforation: Secondary | ICD-10-CM | POA: Diagnosis present

## 2021-11-07 DIAGNOSIS — Z7951 Long term (current) use of inhaled steroids: Secondary | ICD-10-CM

## 2021-11-07 DIAGNOSIS — R600 Localized edema: Secondary | ICD-10-CM | POA: Diagnosis not present

## 2021-11-07 DIAGNOSIS — Z7401 Bed confinement status: Secondary | ICD-10-CM

## 2021-11-07 DIAGNOSIS — L989 Disorder of the skin and subcutaneous tissue, unspecified: Secondary | ICD-10-CM | POA: Diagnosis present

## 2021-11-07 DIAGNOSIS — I130 Hypertensive heart and chronic kidney disease with heart failure and stage 1 through stage 4 chronic kidney disease, or unspecified chronic kidney disease: Secondary | ICD-10-CM | POA: Diagnosis not present

## 2021-11-07 DIAGNOSIS — Z515 Encounter for palliative care: Secondary | ICD-10-CM

## 2021-11-07 DIAGNOSIS — R570 Cardiogenic shock: Secondary | ICD-10-CM | POA: Diagnosis not present

## 2021-11-07 DIAGNOSIS — I2510 Atherosclerotic heart disease of native coronary artery without angina pectoris: Secondary | ICD-10-CM | POA: Diagnosis present

## 2021-11-07 DIAGNOSIS — F17210 Nicotine dependence, cigarettes, uncomplicated: Secondary | ICD-10-CM | POA: Diagnosis present

## 2021-11-07 DIAGNOSIS — Z992 Dependence on renal dialysis: Secondary | ICD-10-CM | POA: Diagnosis not present

## 2021-11-07 DIAGNOSIS — E785 Hyperlipidemia, unspecified: Secondary | ICD-10-CM | POA: Diagnosis present

## 2021-11-07 DIAGNOSIS — R609 Edema, unspecified: Secondary | ICD-10-CM | POA: Diagnosis not present

## 2021-11-07 DIAGNOSIS — R579 Shock, unspecified: Secondary | ICD-10-CM | POA: Diagnosis not present

## 2021-11-07 DIAGNOSIS — Z1152 Encounter for screening for COVID-19: Secondary | ICD-10-CM | POA: Diagnosis not present

## 2021-11-07 DIAGNOSIS — I2699 Other pulmonary embolism without acute cor pulmonale: Secondary | ICD-10-CM | POA: Diagnosis not present

## 2021-11-07 DIAGNOSIS — E8770 Fluid overload, unspecified: Secondary | ICD-10-CM | POA: Diagnosis present

## 2021-11-07 DIAGNOSIS — N3289 Other specified disorders of bladder: Secondary | ICD-10-CM | POA: Diagnosis not present

## 2021-11-07 DIAGNOSIS — R578 Other shock: Secondary | ICD-10-CM | POA: Diagnosis not present

## 2021-11-07 DIAGNOSIS — R34 Anuria and oliguria: Secondary | ICD-10-CM | POA: Diagnosis not present

## 2021-11-07 DIAGNOSIS — R55 Syncope and collapse: Secondary | ICD-10-CM | POA: Diagnosis not present

## 2021-11-07 DIAGNOSIS — I3139 Other pericardial effusion (noninflammatory): Secondary | ICD-10-CM | POA: Diagnosis present

## 2021-11-07 DIAGNOSIS — E1110 Type 2 diabetes mellitus with ketoacidosis without coma: Secondary | ICD-10-CM | POA: Diagnosis not present

## 2021-11-07 DIAGNOSIS — W19XXXA Unspecified fall, initial encounter: Secondary | ICD-10-CM | POA: Diagnosis present

## 2021-11-07 DIAGNOSIS — Z743 Need for continuous supervision: Secondary | ICD-10-CM | POA: Diagnosis not present

## 2021-11-07 DIAGNOSIS — R6521 Severe sepsis with septic shock: Secondary | ICD-10-CM | POA: Diagnosis present

## 2021-11-07 DIAGNOSIS — N189 Chronic kidney disease, unspecified: Secondary | ICD-10-CM | POA: Diagnosis not present

## 2021-11-07 DIAGNOSIS — Z79899 Other long term (current) drug therapy: Secondary | ICD-10-CM

## 2021-11-07 DIAGNOSIS — N179 Acute kidney failure, unspecified: Secondary | ICD-10-CM | POA: Diagnosis not present

## 2021-11-07 DIAGNOSIS — R079 Chest pain, unspecified: Secondary | ICD-10-CM | POA: Diagnosis not present

## 2021-11-07 DIAGNOSIS — R410 Disorientation, unspecified: Secondary | ICD-10-CM | POA: Diagnosis not present

## 2021-11-07 DIAGNOSIS — Z452 Encounter for adjustment and management of vascular access device: Secondary | ICD-10-CM | POA: Diagnosis not present

## 2021-11-07 DIAGNOSIS — Z602 Problems related to living alone: Secondary | ICD-10-CM | POA: Diagnosis present

## 2021-11-07 DIAGNOSIS — R52 Pain, unspecified: Secondary | ICD-10-CM | POA: Diagnosis not present

## 2021-11-07 DIAGNOSIS — Z6839 Body mass index (BMI) 39.0-39.9, adult: Secondary | ICD-10-CM

## 2021-11-07 LAB — RESP PANEL BY RT-PCR (FLU A&B, COVID) ARPGX2
Influenza A by PCR: NEGATIVE
Influenza B by PCR: NEGATIVE
SARS Coronavirus 2 by RT PCR: NEGATIVE

## 2021-11-07 LAB — I-STAT VENOUS BLOOD GAS, ED
Acid-base deficit: 20 mmol/L — ABNORMAL HIGH (ref 0.0–2.0)
Bicarbonate: 8.3 mmol/L — ABNORMAL LOW (ref 20.0–28.0)
Calcium, Ion: 0.84 mmol/L — CL (ref 1.15–1.40)
HCT: 43 % (ref 39.0–52.0)
Hemoglobin: 14.6 g/dL (ref 13.0–17.0)
O2 Saturation: 98 %
Potassium: 5.7 mmol/L — ABNORMAL HIGH (ref 3.5–5.1)
Sodium: 129 mmol/L — ABNORMAL LOW (ref 135–145)
TCO2: 9 mmol/L — ABNORMAL LOW (ref 22–32)
pCO2, Ven: 25.8 mmHg — ABNORMAL LOW (ref 44–60)
pH, Ven: 7.117 — CL (ref 7.25–7.43)
pO2, Ven: 126 mmHg — ABNORMAL HIGH (ref 32–45)

## 2021-11-07 LAB — BRAIN NATRIURETIC PEPTIDE: B Natriuretic Peptide: 1235.5 pg/mL — ABNORMAL HIGH (ref 0.0–100.0)

## 2021-11-07 LAB — PHOSPHORUS: Phosphorus: 6.4 mg/dL — ABNORMAL HIGH (ref 2.5–4.6)

## 2021-11-07 LAB — COMPREHENSIVE METABOLIC PANEL
ALT: 18 U/L (ref 0–44)
ALT: 18 U/L (ref 0–44)
AST: 15 U/L (ref 15–41)
AST: 17 U/L (ref 15–41)
Albumin: 3.1 g/dL — ABNORMAL LOW (ref 3.5–5.0)
Albumin: 2.8 g/dL — ABNORMAL LOW (ref 3.5–5.0)
Alkaline Phosphatase: 190 U/L — ABNORMAL HIGH (ref 38–126)
Alkaline Phosphatase: 179 U/L — ABNORMAL HIGH (ref 38–126)
Anion gap: 16 — ABNORMAL HIGH (ref 5–15)
Anion gap: 20 — ABNORMAL HIGH (ref 5–15)
BUN: 79 mg/dL — ABNORMAL HIGH (ref 8–23)
BUN: 74 mg/dL — ABNORMAL HIGH (ref 8–23)
CO2: 7 mmol/L — ABNORMAL LOW (ref 22–32)
CO2: 8 mmol/L — ABNORMAL LOW (ref 22–32)
Calcium: 7 mg/dL — ABNORMAL LOW (ref 8.9–10.3)
Calcium: 6.7 mg/dL — ABNORMAL LOW (ref 8.9–10.3)
Chloride: 109 mmol/L (ref 98–111)
Chloride: 104 mmol/L (ref 98–111)
Creatinine, Ser: 5.17 mg/dL — ABNORMAL HIGH (ref 0.61–1.24)
Creatinine, Ser: 5.21 mg/dL — ABNORMAL HIGH (ref 0.61–1.24)
GFR, Estimated: 11 mL/min — ABNORMAL LOW (ref >60)
GFR, Estimated: 11 mL/min — ABNORMAL LOW (ref >60)
Glucose, Bld: 96 mg/dL (ref 70–99)
Glucose, Bld: 231 mg/dL — ABNORMAL HIGH (ref 70–99)
Potassium: 6.1 mmol/L — ABNORMAL HIGH (ref 3.5–5.1)
Potassium: 5.9 mmol/L — ABNORMAL HIGH (ref 3.5–5.1)
Sodium: 132 mmol/L — ABNORMAL LOW (ref 135–145)
Sodium: 132 mmol/L — ABNORMAL LOW (ref 135–145)
Total Bilirubin: 1 mg/dL (ref 0.3–1.2)
Total Bilirubin: 1.2 mg/dL (ref 0.3–1.2)
Total Protein: 6.2 g/dL — ABNORMAL LOW (ref 6.5–8.1)
Total Protein: 5.6 g/dL — ABNORMAL LOW (ref 6.5–8.1)

## 2021-11-07 LAB — CBG MONITORING, ED
Glucose-Capillary: 88 mg/dL (ref 70–99)
Glucose-Capillary: 131 mg/dL — ABNORMAL HIGH (ref 70–99)

## 2021-11-07 LAB — CBC
HCT: 43.4 % (ref 39.0–52.0)
HCT: 41.7 % (ref 39.0–52.0)
Hemoglobin: 13.5 g/dL (ref 13.0–17.0)
Hemoglobin: 13.4 g/dL (ref 13.0–17.0)
MCH: 31.3 pg (ref 26.0–34.0)
MCH: 31.4 pg (ref 26.0–34.0)
MCHC: 31.1 g/dL (ref 30.0–36.0)
MCHC: 32.1 g/dL (ref 30.0–36.0)
MCV: 100.5 fL — ABNORMAL HIGH (ref 80.0–100.0)
MCV: 97.7 fL (ref 80.0–100.0)
Platelets: 101 10*3/uL — ABNORMAL LOW (ref 150–400)
Platelets: 91 10*3/uL — ABNORMAL LOW (ref 150–400)
RBC: 4.32 MIL/uL (ref 4.22–5.81)
RBC: 4.27 MIL/uL (ref 4.22–5.81)
RDW: 14.9 % (ref 11.5–15.5)
RDW: 14.7 % (ref 11.5–15.5)
WBC: 9.3 10*3/uL (ref 4.0–10.5)
WBC: 11 10*3/uL — ABNORMAL HIGH (ref 4.0–10.5)
nRBC: 1.1 % — ABNORMAL HIGH (ref 0.0–0.2)
nRBC: 1.2 % — ABNORMAL HIGH (ref 0.0–0.2)

## 2021-11-07 LAB — BLOOD GAS, VENOUS
Acid-base deficit: 17.2 mmol/L — ABNORMAL HIGH (ref 0.0–2.0)
Bicarbonate: 11.4 mmol/L — ABNORMAL LOW (ref 20.0–28.0)
Drawn by: 46492
O2 Saturation: 44.3 %
Patient temperature: 37.3
pCO2, Ven: 36 mmHg — ABNORMAL LOW (ref 44–60)
pH, Ven: 7.11 — CL (ref 7.25–7.43)
pO2, Ven: <31 mmHg — CL (ref 32–45)

## 2021-11-07 LAB — URINALYSIS, MICROSCOPIC (REFLEX)

## 2021-11-07 LAB — LACTIC ACID, PLASMA
Lactic Acid, Venous: 1.4 mmol/L (ref 0.5–1.9)
Lactic Acid, Venous: 1.5 mmol/L (ref 0.5–1.9)
Lactic Acid, Venous: 1.6 mmol/L (ref 0.5–1.9)

## 2021-11-07 LAB — TSH: TSH: 3.848 u[IU]/mL (ref 0.350–4.500)

## 2021-11-07 LAB — URINALYSIS, ROUTINE W REFLEX MICROSCOPIC
Glucose, UA: NEGATIVE mg/dL
Ketones, ur: NEGATIVE mg/dL
Nitrite: NEGATIVE
Protein, ur: 100 mg/dL — AB
Specific Gravity, Urine: >1.03 — ABNORMAL HIGH (ref 1.005–1.030)
pH: 5 (ref 5.0–8.0)

## 2021-11-07 LAB — TROPONIN I (HIGH SENSITIVITY)
Troponin I (High Sensitivity): 23 ng/L — ABNORMAL HIGH (ref <18)
Troponin I (High Sensitivity): 27 ng/L — ABNORMAL HIGH (ref <18)
Troponin I (High Sensitivity): 105 ng/L (ref <18)

## 2021-11-07 LAB — GLUCOSE, CAPILLARY: Glucose-Capillary: 238 mg/dL — ABNORMAL HIGH (ref 70–99)

## 2021-11-07 LAB — CREATININE, URINE, RANDOM: Creatinine, Urine: 289.34 mg/dL

## 2021-11-07 LAB — PROCALCITONIN: Procalcitonin: 0.13 ng/mL

## 2021-11-07 LAB — SODIUM, URINE, RANDOM: Sodium, Ur: 19 mmol/L

## 2021-11-07 LAB — MAGNESIUM: Magnesium: 2.1 mg/dL (ref 1.7–2.4)

## 2021-11-07 LAB — CK: Total CK: 160 U/L (ref 49–397)

## 2021-11-07 MED ORDER — THIAMINE HCL 100 MG/ML IJ SOLN
100.0000 mg | Freq: Every day | INTRAMUSCULAR | Status: DC
  Administered 2021-11-07: 100 mg via INTRAVENOUS
  Filled 2021-11-07: qty 2

## 2021-11-07 MED ORDER — FOLIC ACID 1 MG PO TABS
1.0000 mg | ORAL_TABLET | Freq: Every day | ORAL | Status: DC
  Administered 2021-11-07 – 2021-11-10 (×4): 1 mg via ORAL
  Filled 2021-11-07 (×4): qty 1

## 2021-11-07 MED ORDER — LORAZEPAM 2 MG/ML IJ SOLN
1.0000 mg | INTRAMUSCULAR | Status: AC | PRN
  Administered 2021-11-09: 2 mg via INTRAVENOUS
  Filled 2021-11-07 (×2): qty 1

## 2021-11-07 MED ORDER — SODIUM BICARBONATE 8.4 % IV SOLN
Freq: Once | INTRAVENOUS | Status: AC
  Filled 2021-11-07: qty 1000

## 2021-11-07 MED ORDER — SODIUM CHLORIDE 0.9 % IV BOLUS (SEPSIS)
500.0000 mL | Freq: Once | INTRAVENOUS | Status: AC
  Administered 2021-11-07: 500 mL via INTRAVENOUS

## 2021-11-07 MED ORDER — METRONIDAZOLE 500 MG/100ML IV SOLN
500.0000 mg | Freq: Once | INTRAVENOUS | Status: AC
  Administered 2021-11-07: 500 mg via INTRAVENOUS
  Filled 2021-11-07: qty 100

## 2021-11-07 MED ORDER — CALCIUM GLUCONATE 10 % IV SOLN
INTRAVENOUS | Status: AC
  Filled 2021-11-07: qty 10

## 2021-11-07 MED ORDER — SODIUM CHLORIDE 0.9 % IV BOLUS
500.0000 mL | Freq: Once | INTRAVENOUS | Status: AC
  Administered 2021-11-07: 500 mL via INTRAVENOUS

## 2021-11-07 MED ORDER — CALCIUM GLUCONATE-NACL 1-0.675 GM/50ML-% IV SOLN
INTRAVENOUS | Status: AC
  Filled 2021-11-07: qty 50

## 2021-11-07 MED ORDER — DOCUSATE SODIUM 100 MG PO CAPS: 100.0000 mg | ORAL_CAPSULE | Freq: Two times a day (BID) | ORAL | Status: DC | PRN

## 2021-11-07 MED ORDER — SODIUM CHLORIDE 0.9% FLUSH: 10.0000 mL | INTRAVENOUS | Status: DC | PRN

## 2021-11-07 MED ORDER — SODIUM CHLORIDE 0.9% FLUSH
10.0000 mL | Freq: Two times a day (BID) | INTRAVENOUS | Status: DC
  Administered 2021-11-07: 10 mL
  Administered 2021-11-08: 30 mL
  Administered 2021-11-08 – 2021-11-09 (×2): 10 mL
  Administered 2021-11-09: 30 mL
  Administered 2021-11-10 – 2021-11-11 (×3): 10 mL

## 2021-11-07 MED ORDER — SODIUM BICARBONATE 8.4 % IV SOLN: 100.0000 meq | Freq: Once | INTRAVENOUS | Status: AC

## 2021-11-07 MED ORDER — SODIUM CHLORIDE 0.9 % IV SOLN
2.0000 g | Freq: Once | INTRAVENOUS | Status: AC
  Administered 2021-11-07: 2 g via INTRAVENOUS
  Filled 2021-11-07: qty 12.5

## 2021-11-07 MED ORDER — TETANUS-DIPHTH-ACELL PERTUSSIS 5-2.5-18.5 LF-MCG/0.5 IM SUSY
0.5000 mL | PREFILLED_SYRINGE | Freq: Once | INTRAMUSCULAR | Status: AC
  Administered 2021-11-07: 0.5 mL via INTRAMUSCULAR
  Filled 2021-11-07: qty 0.5

## 2021-11-07 MED ORDER — VANCOMYCIN HCL IN DEXTROSE 1-5 GM/200ML-% IV SOLN
1000.0000 mg | Freq: Once | INTRAVENOUS | Status: AC
  Administered 2021-11-07: 1000 mg via INTRAVENOUS
  Filled 2021-11-07: qty 200

## 2021-11-07 MED ORDER — FENTANYL CITRATE PF 50 MCG/ML IJ SOSY
25.0000 ug | PREFILLED_SYRINGE | INTRAMUSCULAR | Status: AC
  Filled 2021-11-07: qty 1

## 2021-11-07 MED ORDER — SODIUM ZIRCONIUM CYCLOSILICATE 10 G PO PACK
10.0000 g | PACK | Freq: Every day | ORAL | Status: DC
  Administered 2021-11-07: 10 g via ORAL
  Filled 2021-11-07: qty 2

## 2021-11-07 MED ORDER — PRISMASOL BGK 0/2.5 32-2.5 MEQ/L EC SOLN
Status: DC
  Filled 2021-11-07 (×14): qty 5000

## 2021-11-07 MED ORDER — INSULIN ASPART 100 UNIT/ML IV SOLN
5.0000 [IU] | Freq: Once | INTRAVENOUS | Status: AC
  Administered 2021-11-07: 5 [IU] via INTRAVENOUS

## 2021-11-07 MED ORDER — PRISMASOL BGK 0/2.5 32-2.5 MEQ/L EC SOLN
Status: DC
  Filled 2021-11-07 (×5): qty 5000

## 2021-11-07 MED ORDER — CALCIUM GLUCONATE 10 % IV SOLN
1.0000 g | Freq: Once | INTRAVENOUS | Status: AC
  Administered 2021-11-07: 1 g via INTRAVENOUS
  Filled 2021-11-07: qty 10

## 2021-11-07 MED ORDER — FENTANYL CITRATE PF 50 MCG/ML IJ SOSY
12.5000 ug | PREFILLED_SYRINGE | Freq: Once | INTRAMUSCULAR | Status: AC
  Administered 2021-11-07: 12.5 ug via INTRAVENOUS

## 2021-11-07 MED ORDER — SODIUM CHLORIDE 0.9 % IV BOLUS: 1000.0000 mL | Freq: Once | INTRAVENOUS | Status: DC

## 2021-11-07 MED ORDER — PANTOPRAZOLE SODIUM 40 MG IV SOLR
40.0000 mg | INTRAVENOUS | Status: DC
  Administered 2021-11-07 – 2021-11-10 (×4): 40 mg via INTRAVENOUS
  Filled 2021-11-07 (×3): qty 10

## 2021-11-07 MED ORDER — ADULT MULTIVITAMIN W/MINERALS CH
1.0000 | ORAL_TABLET | Freq: Every day | ORAL | Status: DC
  Administered 2021-11-07 – 2021-11-10 (×4): 1 via ORAL
  Filled 2021-11-07 (×4): qty 1

## 2021-11-07 MED ORDER — NOREPINEPHRINE 4 MG/250ML-% IV SOLN
2.0000 ug/min | INTRAVENOUS | Status: DC
  Administered 2021-11-07: 2 ug/min via INTRAVENOUS
  Filled 2021-11-07: qty 250

## 2021-11-07 MED ORDER — LORAZEPAM 1 MG PO TABS: 1.0000 mg | ORAL_TABLET | ORAL | Status: AC | PRN

## 2021-11-07 MED ORDER — CHLORHEXIDINE GLUCONATE CLOTH 2 % EX PADS
6.0000 | MEDICATED_PAD | Freq: Every day | CUTANEOUS | Status: DC
  Administered 2021-11-07: 6 via TOPICAL

## 2021-11-07 MED ORDER — VASOPRESSIN 20 UNITS/100 ML INFUSION FOR SHOCK
0.0300 [IU]/min | INTRAVENOUS | Status: DC
  Administered 2021-11-07 – 2021-11-08 (×3): 0.03 [IU]/min via INTRAVENOUS
  Filled 2021-11-07 (×3): qty 100

## 2021-11-07 MED ORDER — HEPARIN SODIUM (PORCINE) 1000 UNIT/ML DIALYSIS
1000.0000 [IU] | INTRAMUSCULAR | Status: DC | PRN
  Administered 2021-11-07 – 2021-11-09 (×2): 2800 [IU] via INTRAVENOUS_CENTRAL
  Filled 2021-11-07 (×2): qty 6
  Filled 2021-11-07 (×2): qty 3
  Filled 2021-11-07: qty 6

## 2021-11-07 MED ORDER — NOREPINEPHRINE 16 MG/250ML-% IV SOLN
0.0000 ug/min | INTRAVENOUS | Status: DC
  Administered 2021-11-07: 5 ug/min via INTRAVENOUS
  Administered 2021-11-08: 21 ug/min via INTRAVENOUS
  Filled 2021-11-07 (×2): qty 250

## 2021-11-07 MED ORDER — SODIUM BICARBONATE 8.4 % IV SOLN: 50.0000 meq | Freq: Once | INTRAVENOUS | Status: DC

## 2021-11-07 MED ORDER — THIAMINE MONONITRATE 100 MG PO TABS
100.0000 mg | ORAL_TABLET | Freq: Every day | ORAL | Status: DC
  Administered 2021-11-08 – 2021-11-10 (×3): 100 mg via ORAL
  Filled 2021-11-07 (×3): qty 1

## 2021-11-07 MED ORDER — LIDOCAINE HCL (PF) 1 % IJ SOLN
INTRAMUSCULAR | Status: AC
  Administered 2021-11-07: 5 mL
  Filled 2021-11-07: qty 30

## 2021-11-07 MED ORDER — MIDAZOLAM HCL 2 MG/2ML IJ SOLN
2.0000 mg | Freq: Once | INTRAMUSCULAR | Status: AC
  Administered 2021-11-07: 2 mg via INTRAVENOUS
  Filled 2021-11-07: qty 2

## 2021-11-07 MED ORDER — DEXTROSE 50 % IV SOLN
1.0000 | Freq: Once | INTRAVENOUS | Status: AC
  Administered 2021-11-07: 50 mL via INTRAVENOUS
  Filled 2021-11-07: qty 50

## 2021-11-07 MED ORDER — ALBUTEROL SULFATE (2.5 MG/3ML) 0.083% IN NEBU
15.0000 mg | INHALATION_SOLUTION | Freq: Once | RESPIRATORY_TRACT | Status: AC
  Administered 2021-11-07: 15 mg via RESPIRATORY_TRACT
  Filled 2021-11-07 (×2): qty 18

## 2021-11-07 MED ORDER — FUROSEMIDE 10 MG/ML IJ SOLN
40.0000 mg | Freq: Once | INTRAMUSCULAR | Status: AC
  Administered 2021-11-07: 40 mg via INTRAVENOUS
  Filled 2021-11-07: qty 4

## 2021-11-07 MED ORDER — HEPARIN SODIUM (PORCINE) 5000 UNIT/ML IJ SOLN
5000.0000 [IU] | Freq: Three times a day (TID) | INTRAMUSCULAR | Status: DC
  Filled 2021-11-07: qty 1

## 2021-11-07 MED ORDER — SODIUM BICARBONATE 8.4 % IV SOLN
INTRAVENOUS | Status: AC
  Administered 2021-11-07: 100 meq via INTRAVENOUS
  Filled 2021-11-07: qty 100

## 2021-11-07 MED ORDER — LIDOCAINE HCL (PF) 1 % IJ SOLN
INTRAMUSCULAR | Status: AC
  Filled 2021-11-07: qty 5

## 2021-11-07 MED ORDER — LIDOCAINE-EPINEPHRINE (PF) 2 %-1:200000 IJ SOLN
10.0000 mL | Freq: Once | INTRAMUSCULAR | Status: DC
  Filled 2021-11-07: qty 20

## 2021-11-07 MED ORDER — VANCOMYCIN VARIABLE DOSE PER UNSTABLE RENAL FUNCTION (PHARMACIST DOSING): Status: DC

## 2021-11-07 MED ORDER — INSULIN ASPART 100 UNIT/ML IJ SOLN
0.0000 [IU] | INTRAMUSCULAR | Status: DC
  Administered 2021-11-07: 5 [IU] via SUBCUTANEOUS
  Administered 2021-11-08: 3 [IU] via SUBCUTANEOUS
  Administered 2021-11-08 – 2021-11-10 (×5): 2 [IU] via SUBCUTANEOUS
  Administered 2021-11-10: 3 [IU] via SUBCUTANEOUS
  Administered 2021-11-10: 2 [IU] via SUBCUTANEOUS
  Administered 2021-11-10 (×2): 3 [IU] via SUBCUTANEOUS
  Administered 2021-11-11 (×2): 2 [IU] via SUBCUTANEOUS

## 2021-11-07 MED ORDER — LACTATED RINGERS IV BOLUS: 1000.0000 mL | Freq: Once | INTRAVENOUS | Status: DC

## 2021-11-07 MED ORDER — POLYETHYLENE GLYCOL 3350 17 G PO PACK: 17.0000 g | PACK | Freq: Every day | ORAL | Status: DC | PRN

## 2021-11-07 MED ORDER — CHLORHEXIDINE GLUCONATE CLOTH 2 % EX PADS
6.0000 | MEDICATED_PAD | Freq: Every day | CUTANEOUS | Status: DC
  Administered 2021-11-07 – 2021-11-10 (×4): 6 via TOPICAL

## 2021-11-07 MED ORDER — ATROPINE SULFATE 1 MG/ML IV SOLN
INTRAVENOUS | Status: AC
  Filled 2021-11-07: qty 1

## 2021-11-07 MED ORDER — CALCIUM GLUCONATE 10 % IV SOLN
1.0000 g | Freq: Once | INTRAVENOUS | Status: AC
  Administered 2021-11-07: 1 g via INTRAVENOUS

## 2021-11-07 MED ORDER — ATROPINE SULFATE 1 MG/ML IV SOLN
1.0000 mg | Freq: Once | INTRAVENOUS | Status: AC
  Administered 2021-11-07: 1 mg via INTRAVENOUS

## 2021-11-07 MED ORDER — SODIUM CHLORIDE 0.9 % IV SOLN
250.0000 mL | INTRAVENOUS | Status: DC
  Administered 2021-11-07 – 2021-11-09 (×2): 250 mL via INTRAVENOUS

## 2021-11-07 MED ORDER — SODIUM BICARBONATE 8.4 % IV SOLN
50.0000 meq | Freq: Once | INTRAVENOUS | Status: AC
  Administered 2021-11-07: 50 meq via INTRAVENOUS
  Filled 2021-11-07: qty 50

## 2021-11-07 MED ORDER — SODIUM CHLORIDE 0.9 % IV SOLN: 2.0000 g | INTRAVENOUS | Status: DC

## 2021-11-07 MED ORDER — NOREPINEPHRINE 4 MG/250ML-% IV SOLN
0.0000 ug/min | INTRAVENOUS | Status: DC
  Administered 2021-11-07 (×2): 40 ug/min via INTRAVENOUS
  Administered 2021-11-07: 15 ug/min via INTRAVENOUS
  Filled 2021-11-07 (×3): qty 250

## 2021-11-07 NOTE — ED Notes (Signed)
Date and time results received: 11/10/2021 1817 (use smartphrase ".now" to insert current time)  Test: Venous PO2 & PCO2 Critical Value: <31. 7.1  Name of Provider Notified: Dr. Langston Masker  Orders Received? Or Actions Taken?: see chart

## 2021-11-07 NOTE — ED Triage Notes (Signed)
Pt in from home via [facility], per report that the pt had an unwitnessed fall at home due to weakness, pt does not take blood thinners, denies hitting head and denies LOC, pt A&O x4, pt denies UO for several days, pt reports generalized weakness, MAE

## 2021-11-07 NOTE — ED Notes (Signed)
Notified MD re: pts BP, MD at bedside, central line to be placed

## 2021-11-07 NOTE — H&P (Signed)
NAME:  Harry James, MRN:  [id number], DOB:  03-May-1950, LOS: 0 ADMISSION DATE:  11/10/2021, CONSULTATION DATE:  10/15/2021 REFERRING MD:  EDP, CHIEF COMPLAINT:  weakness, fall   History of Present Illness:  Harry James is a 71 y.o. M with PMH of CKD stage 3b, solitary kidney, DM, HTN, HL, tobacco use who presented to the ED at [facility] after several days of decreased UOP and generalized weakness with increasing shortness of breath.  He had a fall without LOC on the day of admission and EMS was called.   On chart review it appears his PCP has been concerned about rising creatinine over the last several months and had him stop taking Lasix.   He denies significant chest pain or recent fevers, no abdominal pain, n/v/d.     He was initially hypotensive in the ED with negative CT abd/pelvis, C-spine and head.  Labs showed Creatinine of 5.17, K 6.1, bicarb 7 and AG 16, pH 7.1, pCO2 25, trop 27, WBC 11k, leuks and bacteria on UA, lactic acid 1.6.  He was initially given 3L IVF and broad spectrum antibiotics then Lasix along with insulin and calcium.  He was transferred to [facility] ED where he arrived on Vaso and Levophed 15mg, on RA with tachypnea and still mentating.  PCCM consulted for admission  Pertinent  Medical History   has a past medical history of Chronic kidney disease, Diabetes mellitus, Hyperlipidemia, Hypertension, Polycythemia, secondary (01/25/2014), Tobacco abuse, and Ulcer.   Significant Hospital Events: Including procedures, antibiotic start and stop dates in addition to other pertinent events   10/24 Initially presented to [facility] ED, [facility]   In shock and renal failure on levo and vaso.  Started on cefepime, flagyl, vancomycin      10/24 CT Head> no acute findings CT abd/pelvis> IMPRESSION: 1. Markedly limited evaluation on this noncontrast study with respiratory motion artifact. 2. Markedly limited evaluation of a known chronic thoracic and abdominal aortic dissection.  Aneurysmal descending thoracic aorta (4.6 cm) as well as interval increase in size of an aneurysmal infrarenal abdominal aorta (5 cm). Recommend referral to a vascular specialist. This recommendation follows ACR consensus guidelines: White Paper of the ACR Incidental Findings Committee II on Vascular Findings. J Am Coll Radiol 2013; 10:789-794. 3. Aneurysmal bilateral common iliac arteries measuring 0.7 cm on the left and 2.6 cm on the right. 4. Aortic Atherosclerosis (ICD10-I70.0) and Emphysema (ICD10-J43.9). 5. Enlarged right atrium. 6. No acute nonvascular intrathoracic, intra-abdominal, intrapelvic abnormality.  Interim History / Subjective:  Pt is protecting his airway and still oriented though tenuous.   <100cc UOP since arrival  Objective   Blood pressure (!) 86/36, pulse (!) 57, temperature 99.1 F (37.3 C), resp. rate 20, height '5\' 5"'$  (1.651 m), weight 104.5 kg, SpO2 97 %.        Intake/Output Summary (Last 24 hours) at 11/09/2021 2048 Last data filed at 11/04/2021 1717 Gross per 24 hour  Intake 253.45 ml  Output --  Net 253.45 ml   Filed Weights   10/22/2021 1151  Weight: 104.5 kg    General:  acutely and chronically ill-appearing M, in mild distress HEENT: MM pink/moist, sclera anicteric Neuro: awake, slow to answer questions but awake and oriented to person and place and following commands CV: s1s2 tachycardic, regular, no m/r/g PULM:  tachypneic, mildly decreased  GI: soft, bsx4 active  Extremities: warm/dry, no edema, ruddy erythema and dry skin,  Skin:  tangelectasia of the face, superficial skin lesions in  various stages of healing    Resolved Hospital Problem list     Assessment & Plan:    Shock, unclear source, septic vs cardiogenic  Possible UTI AGMA  Suspect had worsening renal failure that prompted severe acidosis in conjunction with volume overload and R heart failure  Procal 0.13 and lactic acid remains <2 Lasix stopped several months ago  2/2 worsening kidney function BNP 1200 Enlarged RV on POCUS -continue levophed and vaso, titrate to MAP >65, add epi if third pressor is required -continue Vanc/cefepime -follow blood and urine cultures -trend one more lactic acid -bicarb gtt -formal echo -coox    Acute on chronic CKD 3b renal failure Hyperkalemia, hypocalcemia Appears did not follow up with PCP for worsening renal function -appreciate nephrology recs -start CRRT tonight -received insulin/calcium -follow electrolytes and UOP -avoid nephrotoxins and renally dose medications  Elevated troponin Suspect demand ischemia 27>105 -trend, start Asa -echo in the AM    Type 2 DM -SSI    HTN HL -hold home medications in the setting of shock  Chronic thoracic and abdominal aortic dissection Aneurysmal infrarenal abdominal aorta, increased size from prior -will need vascular eval once through this acute illness   Best Practice (right click and "Reselect all SmartList Selections" daily)   Diet/type: NPO DVT prophylaxis: prophylactic heparin  GI prophylaxis: N/A Lines: Central line Foley:  Yes, and it is still needed Code Status:  full code Last date of multidisciplinary goals of care discussion [pending, Pt stated he was ok with intubation if needed.  He did not want any family called overnight]  Labs   CBC: Recent Labs  Lab 11/04/2021 1101  WBC 9.3  HGB 13.5  HCT 43.4  MCV 100.5*  PLT 101*    Basic Metabolic Panel: Recent Labs  Lab 11/10/2021 1101  NA 132*  K 6.1*  CL 109  CO2 7*  GLUCOSE 96  BUN 79*  CREATININE 5.17*  CALCIUM 7.0*   GFR: Estimated Creatinine Clearance: 14.4 mL/min (A) (by C-G formula based on SCr of 5.17 mg/dL (H)). Recent Labs  Lab 11/04/2021 1101 11/03/2021 1255 11/06/2021 1734  PROCALCITON  --   --  0.13  WBC 9.3  --   --   LATICACIDVEN 1.4 1.5  --     Liver Function Tests: Recent Labs  Lab 11/01/2021 1101  AST 15  ALT 18  ALKPHOS 190*  BILITOT 1.0  PROT  6.2*  ALBUMIN 3.1*   No results for input(s): "LIPASE", "AMYLASE" in the last 168 hours. No results for input(s): "AMMONIA" in the last 168 hours.  ABG    Component Value Date/Time   PHART 7.450 12/21/2010 0430   PCO2ART 42.7 12/21/2010 0430   PO2ART 74.7 (L) 12/21/2010 0430   HCO3 11.4 (L) 10/16/2021 1806   TCO2 30.6 12/21/2010 0430   ACIDBASEDEF 17.2 (H) 10/19/2021 1806   O2SAT 44.3 10/16/2021 1806     Coagulation Profile: No results for input(s): "INR", "PROTIME" in the last 168 hours.  Cardiac Enzymes: No results for input(s): "CKTOTAL", "CKMB", "CKMBINDEX", "TROPONINI" in the last 168 hours.  HbA1C: Hgb A1c MFr Bld  Date/Time Value Ref Range Status  07/27/2021 03:48 PM 5.7 (H) <5.7 % of total Hgb Final    Comment:    For someone without known diabetes, a hemoglobin  A1c value between 5.7% and 6.4% is consistent with prediabetes and should be confirmed with a  follow-up test. . For someone with known diabetes, a value <7% indicates that their diabetes is  well controlled. A1c targets should be individualized based on duration of diabetes, age, comorbid conditions, and other considerations. . This assay result is consistent with an increased risk of diabetes. . Currently, no consensus exists regarding use of hemoglobin A1c for diagnosis of diabetes for children. Marland Kitchen   06/23/2020 08:24 AM 6.2 (H) <5.7 % of total Hgb Final    Comment:    For someone without known diabetes, a hemoglobin  A1c value between 5.7% and 6.4% is consistent with prediabetes and should be confirmed with a  follow-up test. . For someone with known diabetes, a value <7% indicates that their diabetes is well controlled. A1c targets should be individualized based on duration of diabetes, age, comorbid conditions, and other considerations. . This assay result is consistent with an increased risk of diabetes. . Currently, no consensus exists regarding use of hemoglobin A1c for diagnosis  of diabetes for children. .     CBG: Recent Labs  Lab 10/23/2021 1025 11/06/2021 1156  GLUCAP 88 131*    Review of Systems:   Please see the history of present illness. All other systems reviewed and are negative    Past Medical History:  He,  has a past medical history of Chronic kidney disease, Diabetes mellitus, Hyperlipidemia, Hypertension, Polycythemia, secondary (01/25/2014), Tobacco abuse, and Ulcer.   Surgical History:   Past Surgical History:  Procedure Laterality Date   CYSTECTOMY  under tongue   LAPAROTOMY  12/16/2010   [name] patch perf du Procedure: EXPLORATORY LAPAROTOMY;  Surgeon: [name], MD;  Location: [facility];  Service: General;  Laterality: N/A;     Social History:   reports that he has been smoking cigarettes. He has been smoking an average of 1 pack per day. He has never used smokeless tobacco. He reports current alcohol use. He reports that he does not use drugs.   Family History:  His family history is not on file.   Allergies No Known Allergies   Home Medications  Prior to Admission medications   Medication Sig Start Date End Date Taking? Authorizing Provider  albuterol (VENTOLIN HFA) 108 (90 Base) MCG/ACT inhaler Inhale 2 puffs into the lungs every 6 (six) hours as needed for wheezing or shortness of breath. 08/03/21  Yes Pickard, Cammie Mcgee, MD  cloNIDine (CATAPRES) 0.3 MG tablet TAKE 1 TABLET(0.3 MG) BY MOUTH TWICE DAILY Patient taking differently: Take 0.3 mg by mouth 2 (two) times daily. 07/24/21  Yes Susy Frizzle, MD  dapagliflozin propanediol (FARXIGA) 10 MG TABS tablet TAKE 1 TABLET(10 MG) BY MOUTH DAILY BEFORE BREAKFAST Patient taking differently: Take 10 mg by mouth daily. 10/26/21  Yes Susy Frizzle, MD  Fish Oil OIL Take 1 tablet by mouth daily.   Yes [provider]  furosemide (LASIX) 80 MG tablet Take 1 tablet (80 mg total) by mouth daily. 09/28/20  Yes Susy Frizzle, MD  metoprolol tartrate (LOPRESSOR) 100 MG  tablet TAKE 1 TABLET BY MOUTH TWICE DAILY Patient taking differently: Take 100 mg by mouth 2 (two) times daily. 09/15/21  Yes Susy Frizzle, MD  Multiple Vitamins-Minerals (MULTIVITAMINS THER. W/MINERALS) TABS Take 1 tablet by mouth daily. 12/27/10  Yes Earnstine Regal, PA-C  omeprazole (PRILOSEC) 40 MG capsule TAKE 1 CAPSULE(40 MG) BY MOUTH DAILY Patient taking differently: Take 40 mg by mouth daily. 01/04/17  Yes Susy Frizzle, MD  TRELEGY ELLIPTA 100-62.5-25 MCG/ACT AEPB INHALE 1 PUFF INTO THE LUNGS DAILY 08/08/21  Yes Susy Frizzle, MD  valsartan (DIOVAN) 160 MG  tablet TAKE 1 TABLET(160 MG) BY MOUTH DAILY Patient taking differently: Take 160 mg by mouth daily. 03/20/21  Yes Susy Frizzle, MD  doxazosin (CARDURA) 2 MG tablet TAKE 1 TABLET(2 MG) BY MOUTH DAILY Patient not taking: Reported on 11/08/2021 07/08/20   Susy Frizzle, MD  simvastatin (ZOCOR) 40 MG tablet TAKE 1 TABLET BY MOUTH EVERY EVENING Patient taking differently: Take 40 mg by mouth daily at 6 PM. 03/23/21   Susy Frizzle, MD  traMADol (ULTRAM) 50 MG tablet TAKE 2 TABLETS(100 MG) BY MOUTH FOUR TIMES DAILY Patient not taking: Reported on 10/27/2021 09/01/21   Susy Frizzle, MD     Critical care time: 55 minutes      CRITICAL CARE Performed by: Otilio Carpen Kelcy Baeten   Total critical care time: 55 minutes  Critical care time was exclusive of separately billable procedures and treating other patients.  Critical care was necessary to treat or prevent imminent or life-threatening deterioration.  Critical care was time spent personally by me on the following activities: development of treatment plan with patient and/or surrogate as well as nursing, discussions with consultants, evaluation of patient's response to treatment, examination of patient, obtaining history from patient or surrogate, ordering and performing treatments and interventions, ordering and review of laboratory studies, ordering and review of  radiographic studies, pulse oximetry and re-evaluation of patient's condition.   Otilio Carpen Issabelle Mcraney, PA-C [facility] See Amion for pager If no response to pager , please call [phone] until 7pm After 7:00 pm call Elink  [phone]?Show Low

## 2021-11-07 NOTE — ED Notes (Signed)
MD at bedside. 

## 2021-11-07 NOTE — ED Notes (Signed)
Bare hugger removed at this time.

## 2021-11-07 NOTE — ED Notes (Signed)
MD notified re: BP

## 2021-11-07 NOTE — Progress Notes (Signed)
Pharmacy Antibiotic Note  Harry James is a 71 y.o. male admitted on 10/22/2021 with  unknown source .  Pharmacy has been consulted for Vancomycin and Cefepime dosing. AKI on CKI, Scr 5.17 and baseline 1.78. patient with solidarity kidney presents with reduced urine output and weakness. Patient is hyperkalemic at 6.1.   Plan: Vancomycin 2gm IV loading dose, then variable dosing based on renal function or if plan to dialyzed Cefepime 2gm IV q24h F/U cxs and clinical progress Monitor V/S, labs and levels as indicated  Weight: 104.5 kg (230 lb 6.1 oz)  Temp (24hrs), Avg:93.2 F (34 C), Min:93.2 F (34 C), Max:93.2 F (34 C)  Recent Labs  Lab 11/13/2021 1101  WBC 9.3  CREATININE 5.17*  LATICACIDVEN 1.4    Estimated Creatinine Clearance: 14.6 mL/min (A) (by C-G formula based on SCr of 5.17 mg/dL (H)).    No Known Allergies  Antimicrobials this admission: Vancomycin 10/24 >>  cefepime 10/24 >>    Microbiology results: 10/24 BCx: pending 10/24 UCx: pending   MRSA PCR:   Thank you for allowing pharmacy to be a part of this patient's care.  Isac Sarna, BS Pharm D, BCPS Clinical Pharmacist 11/14/2021 1:32 PM

## 2021-11-07 NOTE — Progress Notes (Signed)
eLink Physician-Brief Progress Note Patient Name: Harry James DOB: [birth year] MRN: [id number]   Date of Service  10/28/2021  HPI/Events of Note  Blood glucose = 238. BMI = 38.34, however, Creatinine = 5.21. Will start with moderate Novolog SS.   eICU Interventions  Plan: Q 4 hour moderate Novolog SSI. Will relay message about HD access for CRRT to PCCM ground team.     Intervention Category Major Interventions: Hyperglycemia - active titration of insulin therapy  Lorenda Grecco Cornelia Copa 11/04/2021, 11:30 PM

## 2021-11-07 NOTE — Procedures (Signed)
Arterial Catheter Insertion Procedure Note  Harry James  [id number]  [birth year]  Date:10/28/2021  Time:10:18 PM    Provider Performing: Otilio Carpen Mickaela Starlin    Procedure: Insertion of Arterial Line [phone]) with US guidance ([id number])   Indication(s) Blood pressure monitoring and/or need for frequent ABGs  Consent Risks of the procedure as well as the alternatives and risks of each were explained to the patient and/or caregiver.  Consent for the procedure was obtained and is signed in the bedside chart  Anesthesia None   Time Out Verified patient identification, verified procedure, site/side was marked, verified correct patient position, special equipment/implants available, medications/allergies/relevant history reviewed, required imaging and test results available.   Sterile Technique Maximal sterile technique including full sterile barrier drape, hand hygiene, sterile gown, sterile gloves, mask, hair covering, sterile ultrasound probe cover (if used).   Procedure Description Area of catheter insertion was cleaned with chlorhexidine and draped in sterile fashion. With real-time ultrasound guidance an arterial catheter was placed into the left radial artery.  Appropriate arterial tracings confirmed on monitor.     Complications/Tolerance None; patient tolerated the procedure well.   EBL Minimal   Specimen(s) None  Otilio Carpen Harry Leamer, PA-C

## 2021-11-07 NOTE — ED Notes (Signed)
Intensivest at bedside

## 2021-11-07 NOTE — ED Notes (Signed)
Patient placed in reverse trendelenburg while provider is working

## 2021-11-07 NOTE — ED Notes (Signed)
Provider Dr Nechama Guard at bedside at this time

## 2021-11-07 NOTE — Consult Note (Addendum)
Nephrology Consult  Assessment/Recommendations:   AKI on CKD 3 (bl Cr~1.7-1.8) -AKI secondary to ischemic ATN in the setting of shock with concomitant ARB/SGLT2i use -given severe acidemia & hyperK, will start CRRT tonight. Appreciate CCM's assistance with temp line placement -all 2k bags, net even tonight given pressor requirements but can increase UF goals slowly depending on hemodynamics throughout the night -Avoid nephrotoxic medications including NSAIDs and iodinated intravenous contrast exposure unless the latter is absolutely indicated.  Preferred narcotic agents for pain control are hydromorphone, fentanyl, and methadone. Morphine should not be used. Avoid Baclofen and avoid oral sodium phosphate and magnesium citrate based laxatives / bowel preps. Continue strict Input and Output monitoring. Will monitor the patient closely with you and intervene or adjust therapy as indicated by changes in clinical status/labs   Shock -pressor support per CCM. Hopefully as acidemia improves so will Harry James pressor requirements  Hyperkalemia -CRRT as above  Anion Gap Metabolic acidosis -likely stemming from AKI but there is a possibility of euglycemic DKA in the context of Farxiga, CRRT as above. -lactate WNL  Possible sepsis -UA abnormal, Ucx pending. On empiric abx--per primary service  Recommendations conveyed to primary service.    [facility] 10/24/2021 9:25 PM   _____________________________________________________________________________________   History of Present Illness: Harry James is a/an 71 y.o. male with a past medical history of CKD 3B, hypertension, DM 2, thoracic aorta aneurysm, hyperlipidemia, polycythemia, history of perforated duodenal ulcer, tobacco abuse who originally presented with weakness/fall.  Apparently was having decreased urine output did not make any urine the day prior.  Has also been having worsening shortness of breath.  Was  initially on a pan however transferred here for consideration of CRRT.  Was found to have a creatinine of 5.17, potassium 6.1, sodium 132, bicarb 7 with an anion gap.  Found to be in shock with hypothermia.  Started on empiric antibiotics and pressors which have been escalating in dose.  Harry James pH on blood gas was 7.1.  Consulted for CRRT, temporary HD catheter to be placed by CCM.  Pan scan without contrast did reveal aneurysmal descending thoracic aorta (known) with interval increase in size of an aneurysmal infrarenal abdominal aorta, aneurysmal bilateral common iliac arteries, enlarged right atrium, no renal obstruction but does have bilateral renal cortical scarring with punctate calcifications.  Patient seen and examined in ICU. He reports that he was started on Farxiga about 2 months ago and was doing well with this up until about a week ago when Harry James appetite became poor. Since then, has been having issues with nausea, falls. He reports that Harry James symptoms started all of a sudden. He does report that Harry James breathing has been worsening since being in the hospital. He does currently endorse thirst. Patient reports that he does not follow with a nephrologist. He also reports that he has one functioning kidney, not sure where that came from. Looking back at Harry James scans from 2004, Harry James left kidney was already smaller than Harry James right. Discussed with ER staff and there has been minimal urine output since receiving IVF.  Attempted to call Harry James brother x 3 but no answer.  Medications:  Current Facility-Administered Medications  Medication Dose Route Frequency Provider Last Rate Last Admin   0.9 %  sodium chloride infusion  250 mL Intravenous Continuous Fransico Meadow, MD 10 mL/hr at 10/27/2021 1601 250 mL at 10/21/2021 1601   calcium gluconate in NaCl 1-0.675 GM/50ML-% IVPB            [  START ON 11/08/2021] ceFEPIme (MAXIPIME) 2 g in sodium chloride 0.9 % 100 mL IVPB  2 g Intravenous Q24H Fransico Meadow, MD        docusate sodium (COLACE) capsule 100 mg  100 mg Oral BID PRN Gleason, Otilio Carpen, PA-C       fentaNYL (SUBLIMAZE) injection 25 mcg  25 mcg Intravenous STAT Fransico Meadow, MD       folic acid (FOLVITE) tablet 1 mg  1 mg Oral Daily Fransico Meadow, MD   1 mg at 10/23/2021 1107   heparin injection 5,000 Units  5,000 Units Subcutaneous Q8H Tanda Rockers, MD       lidocaine-EPINEPHrine (XYLOCAINE W/EPI) 2 %-1:200000 (PF) injection 10 mL  10 mL Other Once Fransico Meadow, MD       LORazepam (ATIVAN) tablet 1-4 mg  1-4 mg Oral Q1H PRN Fransico Meadow, MD       Or   LORazepam (ATIVAN) injection 1-4 mg  1-4 mg Intravenous Q1H PRN Fransico Meadow, MD       multivitamin with minerals tablet 1 tablet  1 tablet Oral Daily Fransico Meadow, MD   1 tablet at 10/30/2021 1107   norepinephrine (LEVOPHED) '4mg'$  in 262m (0.016 mg/mL) premix infusion  0-40 mcg/min Intravenous Titrated PFransico Meadow MD 131.3 mL/hr at 10/30/2021 1720 35 mcg/min at 10/20/2021 1720   pantoprazole (PROTONIX) injection 40 mg  40 mg Intravenous Q24H WTanda Rockers MD   40 mg at 10/15/2021 1801   polyethylene glycol (MIRALAX / GLYCOLAX) packet 17 g  17 g Oral Daily PRN Gleason, LOtilio Carpen PA-C       sodium bicarbonate 150 mEq in dextrose 5 % 1,150 mL infusion   Intravenous Once WTanda Rockers MD 100 mL/hr at 10/29/2021 2016 New Bag at 11/03/2021 2016   sodium zirconium cyclosilicate (LOKELMA) packet 10 g  10 g Oral Daily PFransico Meadow MD   10 g at 11/04/2021 1114   thiamine (VITAMIN B1) tablet 100 mg  100 mg Oral Daily PFransico Meadow MD       Or   thiamine (VITAMIN B1) injection 100 mg  100 mg Intravenous Daily PFransico Meadow MD   100 mg at 10/26/2021 1137   vancomycin variable dose per unstable renal function (pharmacist dosing)   Does not apply See admin instructions PFransico Meadow MD       vasopressin (PITRESSIN) 20 Units in sodium chloride 0.9 % 100 mL infusion-*FOR SHOCK*  0.03 Units/min Intravenous Continuous  WTanda Rockers MD 9 mL/hr at 11/06/2021 1801 0.03 Units/min at 11/10/2021 1801   Current Outpatient Medications  Medication Sig Dispense Refill   albuterol (VENTOLIN HFA) 108 (90 Base) MCG/ACT inhaler Inhale 2 puffs into the lungs every 6 (six) hours as needed for wheezing or shortness of breath. 8.5 g 1   cloNIDine (CATAPRES) 0.3 MG tablet TAKE 1 TABLET(0.3 MG) BY MOUTH TWICE DAILY (Patient taking differently: Take 0.3 mg by mouth 2 (two) times daily.) 180 tablet 0   dapagliflozin propanediol (FARXIGA) 10 MG TABS tablet TAKE 1 TABLET(10 MG) BY MOUTH DAILY BEFORE BREAKFAST (Patient taking differently: Take 10 mg by mouth daily.) 30 tablet 3   Fish Oil OIL Take 1 tablet by mouth daily.     furosemide (LASIX) 80 MG tablet Take 1 tablet (80 mg total) by mouth daily. 90 tablet 3   metoprolol tartrate (LOPRESSOR) 100 MG tablet TAKE 1 TABLET BY MOUTH TWICE DAILY (Patient taking  differently: Take 100 mg by mouth 2 (two) times daily.) 180 tablet 2   Multiple Vitamins-Minerals (MULTIVITAMINS THER. W/MINERALS) TABS Take 1 tablet by mouth daily. 30 each    omeprazole (PRILOSEC) 40 MG capsule TAKE 1 CAPSULE(40 MG) BY MOUTH DAILY (Patient taking differently: Take 40 mg by mouth daily.) 90 capsule 3   TRELEGY ELLIPTA 100-62.5-25 MCG/ACT AEPB INHALE 1 PUFF INTO THE LUNGS DAILY 60 each 3   valsartan (DIOVAN) 160 MG tablet TAKE 1 TABLET(160 MG) BY MOUTH DAILY (Patient taking differently: Take 160 mg by mouth daily.) 90 tablet 3   doxazosin (CARDURA) 2 MG tablet TAKE 1 TABLET(2 MG) BY MOUTH DAILY (Patient not taking: Reported on 10/28/2021) 90 tablet 3   simvastatin (ZOCOR) 40 MG tablet TAKE 1 TABLET BY MOUTH EVERY EVENING (Patient taking differently: Take 40 mg by mouth daily at 6 PM.) 90 tablet 1   traMADol (ULTRAM) 50 MG tablet TAKE 2 TABLETS(100 MG) BY MOUTH FOUR TIMES DAILY (Patient not taking: Reported on 10/23/2021) 60 tablet 0     ALLERGIES Patient has no known allergies.  MEDICAL HISTORY Past Medical  History:  Diagnosis Date   Chronic kidney disease    has one functional kidney   Diabetes mellitus    prediabetes   Hyperlipidemia    Hypertension    Polycythemia, secondary 01/25/2014   Tobacco abuse    Ulcer    perforated duodenal ulcer 12/12     SOCIAL HISTORY Social History   Socioeconomic History   Marital status: Divorced    Spouse name: Not on file   Number of children: Not on file   Years of education: Not on file   Highest education level: Not on file  Occupational History   Not on file  Tobacco Use   Smoking status: Every Day    Packs/day: 1.00    Types: Cigarettes   Smokeless tobacco: Never  Substance and Sexual Activity   Alcohol use: Yes    Comment: daily   Drug use: No   Sexual activity: Not on file  Other Topics Concern   Not on file  Social History Narrative   Not on file   Social Determinants of Health   Financial Resource Strain: Low Risk  (07/01/2020)   Overall Financial Resource Strain (CARDIA)    Difficulty of Paying Living Expenses: Not hard at all  Food Insecurity: No Food Insecurity (07/01/2020)   Hunger Vital Sign    Worried About Running Out of Food in the Last Year: Never true    Ran Out of Food in the Last Year: Never true  Transportation Needs: No Transportation Needs (07/01/2020)   PRAPARE - Hydrologist (Medical): No    Lack of Transportation (Non-Medical): No  Physical Activity: Inactive (07/01/2020)   Exercise Vital Sign    Days of Exercise per Week: 0 days    Minutes of Exercise per Session: 0 min  Stress: No Stress Concern Present (07/01/2020)   [place]    Feeling of Stress : Not at all  Social Connections: Socially Isolated (07/01/2020)   Social Connection and Isolation Panel [NHANES]    Frequency of Communication with Friends and Family: Twice a week    Frequency of Social Gatherings with Friends and Family: Once a week     Attends Religious Services: Never    Marine scientist or Organizations: No    Attends Archivist Meetings: Never  Marital Status: Divorced  Human resources officer Violence: Not At Risk (07/01/2020)   Humiliation, Afraid, Rape, and Kick questionnaire    Fear of Current or Ex-Partner: No    Emotionally Abused: No    Physically Abused: No    Sexually Abused: No     FAMILY HISTORY No family history on file.   Review of Systems: 12 systems reviewed Otherwise as per HPI, all other systems reviewed and negative  Physical Exam: Vitals:   11/08/2021 2027 10/26/2021 2036  BP: 98/85 (!) 74/61  Pulse: (!) 56 (!) 48  Resp:    Temp: 99.1 F (37.3 C) 99.1 F (37.3 C)  SpO2: 97% 96%   No intake/output data recorded.  Intake/Output Summary (Last 24 hours) at 11/02/2021 2125 Last data filed at 11/12/2021 1717 Gross per 24 hour  Intake 253.45 ml  Output --  Net 253.45 ml   General: ill appearing, in resp distress HEENT: anicteric sclera CV: regular rate, normal rhythm, no murmurs, no gallops, no rubs Lungs: +rales b/l, inc'ed WOB/tachypneic Abd: obese, soft, non-tender, non-distended Skin: multiple excoriations diffusely Psych: alert, engaged, appropriate mood and affect Musculoskeletal: tense edema b/l LEs Neuro: no gross focal deficits, slow to answer  Test Results Reviewed Lab Results  Component Value Date   NA 129 (L) 10/20/2021   K 5.7 (H) 11/14/2021   CL 109 10/29/2021   CO2 7 (L) 10/15/2021   BUN 79 (H) 10/29/2021   CREATININE 5.17 (H) 11/06/2021   CALCIUM 7.0 (L) 11/06/2021   ALBUMIN 3.1 (L) 10/18/2021   PHOS 3.3 12/25/2010     I have reviewed all relevant outside healthcare records related to the patient's kidney injury.

## 2021-11-07 NOTE — ED Notes (Signed)
MD notified re: rectal temp 93.2, pt placed on bair hugger

## 2021-11-07 NOTE — ED Notes (Signed)
This RN calls patient placement to see if the patient may get a bed. Sherita from patient placement reports that she is unsure when patient will get a bed. Elroy RN charge nurse made aware of the situation and is in contact with the [facility] who reports that the patient will be getting a bed momentarily. ICU provider Dayton Scrape notified at bedside of the policies shared with this RN by [facility] RN

## 2021-11-07 NOTE — ED Notes (Signed)
Bladder scanned pt the most urine I saw was 184m

## 2021-11-07 NOTE — Procedures (Signed)
Central Venous Catheter Insertion Procedure Note  Harry James  [id number]  19-Dec-1950  Date:10/27/2021  Time:11:43 PM   Provider Performing:Chetan Mehring R Avelynn Sellin   Procedure: Insertion of Non-tunneled Central Venous Catheter(36556)with US guidance (96295)    Indication(s) Hemodialysis  Consent Risks of the procedure as well as the alternatives and risks of each were explained to the patient and/or caregiver.  Consent for the procedure was obtained and is signed in the bedside chart  Anesthesia Topical only with 1% lidocaine   Timeout Verified patient identification, verified procedure, site/side was marked, verified correct patient position, special equipment/implants available, medications/allergies/relevant history reviewed, required imaging and test results available.  Sterile Technique Maximal sterile technique including full sterile barrier drape, hand hygiene, sterile gown, sterile gloves, mask, hair covering, sterile ultrasound probe cover (if used).  Procedure Description Area of catheter insertion was cleaned with chlorhexidine and draped in sterile fashion.   With real-time ultrasound guidance a 20cm HD catheter was placed into the right femoral vein.  There was an existing CVC in the R IJ, L IJ without good access point.  Despite skin erythema in the groin assessed was the best site to place. Nonpulsatile blood flow and easy flushing noted in all ports.  The catheter was sutured in place and sterile dressing applied.  Complications/Tolerance None; patient tolerated the procedure well. Chest X-ray is ordered to verify placement for internal jugular or subclavian cannulation.  Chest x-ray is not ordered for femoral cannulation.  EBL Minimal  Specimen(s) None  Harry James Harry Lafave, PA-C

## 2021-11-07 NOTE — Consult Note (Signed)
NAME:  Harry James, MRN:  [id number], DOB:  1950/12/02, LOS: 0 ADMISSION DATE:  10/18/2021, CONSULTATION DATE:  10/24 REFERRING MD:  Patterson/ edp, CHIEF COMPLAINT:  severe weakness/ acute renal failure    History of Present Illness:  71 year old male smoker with a history of CKD and solitary kidney, diabetes, hypertension, hyperlipidemia who presents to the emergency department with weakness and fall.  Patient stated that for the past several days PTA  noted decreased urine output.  Did not make any urine 10/23 assoc with increasing shortness of breath and generalized weakness.  Partner called 911 am 10/24 after he had a fall where he struck his head.  Denied any chest pain.  Denied any leg swelling but does have chronic  leg redness.  ever been on dialysis before.  PCCM consulted for eval pm 10/24 with pt s specific complaints though variably sob, no cp, abd pain or nausea/ fever or chills      Significant Hospital Events: Including procedures, antibiotic start and stop dates in addition to other pertinent events   CVL R I J   10/24  >  CVP initial eye ball > 15  Bedside echo 19/37   ok systolic function/ pericardial effusion s tamponade criteria per EDP  Interim History / Subjective:  Uncomfortable p cvl but still no specific c/o's  Objective   Blood pressure (!) 81/44, pulse 65, temperature (!) 93.2 F (34 C), temperature source Rectal, resp. rate 14, height '5\' 5"'$  (1.651 m), weight 104.5 kg, SpO2 95 %.        Intake/Output Summary (Last 24 hours) at 10/25/2021 1648 Last data filed at 11/13/2021 1132 Gross per 24 hour  Intake 90.66 ml  Output --  Net 90.66 ml   Filed Weights   11/03/2021 1151  Weight: 104.5 kg   Cvp approx 15   Examination: Tmax:  93.2 General appearance:    chronically ill red faced wm nad   At Rest 02 sats  96% on  ? 10 lpm  No jvd Oropharynx clear,  mucosa nl Neck supple  Lungs with a few scattered exp > insp rhonchi bilaterally RRR no s3 or  or sign murmur Abd obese with linite  excursion  Extr warm with no edema or clubbing noted - bilateral venous stasis changes but no pitting edema or obvious skin breakdown/ulceration Neuro  Sensorium intact,  no apparent motor deficits     I personally reviewed images and agree with radiology impression as follows:  CXR:   portable 10/24  Interval placement of right IJ central venous catheter, with no complicating features  Assessment & Plan:   1)   AKI / oliguria in pt with CRI ? Etiology assoc with low bp / hypothermia ? Sesptic from ? Source  Lab Results  Component Value Date   CREATININE 5.17 (H) 10/22/2021   CREATININE 1.78 (H) 07/27/2021   CREATININE 1.74 (H) 06/23/2020   Rec cultures/ PCT/  levophed/ serial cvps Agree with max/vanc for now  - urine lytes/ US renal pending    2) severe metabolic acidosis with AG /  hyperkalemia secondary to #1  rx per EDP/ renal > likely will need CVVH w/in next 24 hours > transfer to [facility]    Best Practice (right click and "Reselect all SmartList Selections" daily)     Labs   CBC: Recent Labs  Lab 11/02/2021 1101  WBC 9.3  HGB 13.5  HCT 43.4  MCV 100.5*  PLT 101*    Basic  Metabolic Panel: Recent Labs  Lab 10/28/2021 1101  NA 132*  K 6.1*  CL 109  CO2 7*  GLUCOSE 96  BUN 79*  CREATININE 5.17*  CALCIUM 7.0*   GFR: Estimated Creatinine Clearance: 14.4 mL/min (A) (by C-G formula based on SCr of 5.17 mg/dL (H)). Recent Labs  Lab 11/06/2021 1101 10/20/2021 1255  WBC 9.3  --   LATICACIDVEN 1.4 1.5    Liver Function Tests: Recent Labs  Lab 10/21/2021 1101  AST 15  ALT 18  ALKPHOS 190*  BILITOT 1.0  PROT 6.2*  ALBUMIN 3.1*   No results for input(s): "LIPASE", "AMYLASE" in the last 168 hours. No results for input(s): "AMMONIA" in the last 168 hours.  ABG    Component Value Date/Time   PHART 7.450 12/21/2010 0430   PCO2ART 42.7 12/21/2010 0430   PO2ART 74.7 (L) 12/21/2010 0430   HCO3 29.3 (H) 12/21/2010 0430    TCO2 30.6 12/21/2010 0430   ACIDBASEDEF 2.4 (H) 12/20/2010 0310   O2SAT 72.9 11/27/2013 1549     Coagulation Profile: No results for input(s): "INR", "PROTIME" in the last 168 hours.  Cardiac Enzymes: No results for input(s): "CKTOTAL", "CKMB", "CKMBINDEX", "TROPONINI" in the last 168 hours.  HbA1C: Hgb A1c MFr Bld  Date/Time Value Ref Range Status  07/27/2021 03:48 PM 5.7 (H) <5.7 % of total Hgb Final    Comment:    For someone without known diabetes, a hemoglobin  A1c value between 5.7% and 6.4% is consistent with prediabetes and should be confirmed with a  follow-up test. . For someone with known diabetes, a value <7% indicates that their diabetes is well controlled. A1c targets should be individualized based on duration of diabetes, age, comorbid conditions, and other considerations. . This assay result is consistent with an increased risk of diabetes. . Currently, no consensus exists regarding use of hemoglobin A1c for diagnosis of diabetes for children. Marland Kitchen   06/23/2020 08:24 AM 6.2 (H) <5.7 % of total Hgb Final    Comment:    For someone without known diabetes, a hemoglobin  A1c value between 5.7% and 6.4% is consistent with prediabetes and should be confirmed with a  follow-up test. . For someone with known diabetes, a value <7% indicates that their diabetes is well controlled. A1c targets should be individualized based on duration of diabetes, age, comorbid conditions, and other considerations. . This assay result is consistent with an increased risk of diabetes. . Currently, no consensus exists regarding use of hemoglobin A1c for diagnosis of diabetes for children. .     CBG: Recent Labs  Lab 10/20/2021 1025 11/08/2021 1156  GLUCAP 88 131*       Past Medical History:  He,  has a past medical history of Chronic kidney disease, Diabetes mellitus, Hyperlipidemia, Hypertension, Polycythemia, secondary (01/25/2014), Tobacco abuse, and Ulcer.    Surgical History:   Past Surgical History:  Procedure Laterality Date   CYSTECTOMY  under tongue   LAPAROTOMY  12/16/2010   [name] patch perf du Procedure: EXPLORATORY LAPAROTOMY;  Surgeon: [name], MD;  Location: [place];  Service: General;  Laterality: N/A;     Social History:   reports that he has been smoking cigarettes. He has been smoking an average of 1 pack per day. He has never used smokeless tobacco. He reports current alcohol use. He reports that he does not use drugs.   Family History:  His family history is not on file.   Allergies No Known Allergies   Home  Medications  Prior to Admission medications   Medication Sig Start Date End Date Taking? Authorizing Provider  albuterol (VENTOLIN HFA) 108 (90 Base) MCG/ACT inhaler Inhale 2 puffs into the lungs every 6 (six) hours as needed for wheezing or shortness of breath. 08/03/21  Yes Pickard, Cammie Mcgee, MD  cloNIDine (CATAPRES) 0.3 MG tablet TAKE 1 TABLET(0.3 MG) BY MOUTH TWICE DAILY Patient taking differently: Take 0.3 mg by mouth 2 (two) times daily. 07/24/21  Yes Susy Frizzle, MD  dapagliflozin propanediol (FARXIGA) 10 MG TABS tablet TAKE 1 TABLET(10 MG) BY MOUTH DAILY BEFORE BREAKFAST Patient taking differently: Take 10 mg by mouth daily. 10/26/21  Yes Susy Frizzle, MD  Fish Oil OIL Take 1 tablet by mouth daily.   Yes [provider]  furosemide (LASIX) 80 MG tablet Take 1 tablet (80 mg total) by mouth daily. 09/28/20  Yes Susy Frizzle, MD  metoprolol tartrate (LOPRESSOR) 100 MG tablet TAKE 1 TABLET BY MOUTH TWICE DAILY Patient taking differently: Take 100 mg by mouth 2 (two) times daily. 09/15/21  Yes Susy Frizzle, MD  Multiple Vitamins-Minerals (MULTIVITAMINS THER. W/MINERALS) TABS Take 1 tablet by mouth daily. 12/27/10  Yes Earnstine Regal, PA-C  omeprazole (PRILOSEC) 40 MG capsule TAKE 1 CAPSULE(40 MG) BY MOUTH DAILY Patient taking differently: Take 40 mg by mouth daily. 01/04/17   Yes Susy Frizzle, MD  simvastatin (ZOCOR) 40 MG tablet TAKE 1 TABLET BY MOUTH EVERY EVENING Patient taking differently: Take 40 mg by mouth daily at 6 PM. 03/23/21  Yes Susy Frizzle, MD  TRELEGY ELLIPTA 100-62.5-25 MCG/ACT AEPB INHALE 1 PUFF INTO THE LUNGS DAILY 08/08/21  Yes Susy Frizzle, MD  valsartan (DIOVAN) 160 MG tablet TAKE 1 TABLET(160 MG) BY MOUTH DAILY Patient taking differently: Take 160 mg by mouth daily. 03/20/21  Yes Susy Frizzle, MD  doxazosin (CARDURA) 2 MG tablet TAKE 1 TABLET(2 MG) BY MOUTH DAILY Patient not taking: Reported on 11/08/2021 07/08/20   Susy Frizzle, MD  traMADol (ULTRAM) 50 MG tablet TAKE 2 TABLETS(100 MG) BY MOUTH FOUR TIMES DAILY Patient not taking: Reported on 11/12/2021 09/01/21   Susy Frizzle, MD    The patient is critically ill with multiple organ systems failure and requires high complexity decision making for assessment and support, frequent evaluation and titration of therapies, application of advanced monitoring technologies and extensive interpretation of multiple databases. Critical Care Time devoted to patient care services described in this note is 45  minutes.   Christinia Gully, MD Pulmonary and [place] [phone]   After 7:00 pm call Elink  [phone]

## 2021-11-07 NOTE — ED Provider Notes (Signed)
In short, 71 year old male with a history of CKD and solitary kidney, diabetes, hypertension, hyperlipidemia who presents to the emergency department with weakness and fall.  Patient states that for the past several days she has had decreased urine output.   Work-up noted to have severe acidosis, UTI and AKI on CKD with creatinine 5.17 and potassium 6.1.  He was shifted and started on pressors for shock and broad spectrum antibiotics.  Sent to [facility] for ICU evaluation and admission accepted by Dr Melvyn Novas.   On arrival to ED, patient is critically ill.  He is hypotensive with maps in the 50s despite being on maxed out norepinephrine and vasopressin.  He is still awake alert and mentating appropriately but has thready radial pulses.  Airway satting 97% on room air but concern for developing fluid overload with solitary kidney and receiving approximately 3 L fluid bolus.  Last VBG with pH of 7.1.  Will obtain repeat labs and give patient 2 A of sodium bicarb and after I discussed with ICU team Dr Lynetta Mare.  CRITICAL CARE Performed by: Elgie Congo   Total critical care time: 30 minutes  Critical care time was exclusive of separately billable procedures and treating other patients.  Critical care was necessary to treat or prevent imminent or life-threatening deterioration.  Critical care was time spent personally by me on the following activities: development of treatment plan with patient and/or surrogate as well as nursing, discussions with consultants, evaluation of patient's response to treatment, examination of patient, obtaining history from patient or surrogate, ordering and performing treatments and interventions, ordering and review of laboratory studies, ordering and review of radiographic studies, pulse oximetry and re-evaluation of patient's condition.    Elgie Congo, MD 11/12/2021 2052

## 2021-11-07 NOTE — ED Provider Notes (Addendum)
[facility] EMERGENCY DEPARTMENT Provider Note   CSN: [id number] Arrival date & time: 10/27/2021  7622     History  Chief Complaint  Patient presents with   Lytle Michaels    Harry James is a 71 y.o. male.  71 year old male with a history of CKD and solitary kidney, diabetes, hypertension, hyperlipidemia who presents to the emergency department with weakness and fall.  Patient states that for the past several days she has had decreased urine output.  Did not make any urine yesterday.  Says that he has also had increasing shortness of breath and generalized weakness.  Partner called 911 today after he had a fall where he struck his head per EMS but patient states that he does not have a partner that lives with him.  Denies any chest pain.  Denies any leg swelling but does have leg redness.  Reports that he has never been on dialysis before.     Past Medical History:  Diagnosis Date   Chronic kidney disease    has one functional kidney   Diabetes mellitus    prediabetes   Hyperlipidemia    Hypertension    Polycythemia, secondary 01/25/2014   Tobacco abuse    Ulcer    perforated duodenal ulcer 12/12        Home Medications Prior to Admission medications   Medication Sig Start Date End Date Taking? Authorizing Provider  albuterol (VENTOLIN HFA) 108 (90 Base) MCG/ACT inhaler Inhale 2 puffs into the lungs every 6 (six) hours as needed for wheezing or shortness of breath. 08/03/21  Yes Pickard, Cammie Mcgee, MD  cloNIDine (CATAPRES) 0.3 MG tablet TAKE 1 TABLET(0.3 MG) BY MOUTH TWICE DAILY Patient taking differently: Take 0.3 mg by mouth 2 (two) times daily. 07/24/21  Yes Susy Frizzle, MD  dapagliflozin propanediol (FARXIGA) 10 MG TABS tablet TAKE 1 TABLET(10 MG) BY MOUTH DAILY BEFORE BREAKFAST Patient taking differently: Take 10 mg by mouth daily. 10/26/21  Yes Susy Frizzle, MD  Fish Oil OIL Take 1 tablet by mouth daily.   Yes [provider]  furosemide (LASIX) 80 MG  tablet Take 1 tablet (80 mg total) by mouth daily. 09/28/20  Yes Susy Frizzle, MD  metoprolol tartrate (LOPRESSOR) 100 MG tablet TAKE 1 TABLET BY MOUTH TWICE DAILY Patient taking differently: Take 100 mg by mouth 2 (two) times daily. 09/15/21  Yes Susy Frizzle, MD  Multiple Vitamins-Minerals (MULTIVITAMINS THER. W/MINERALS) TABS Take 1 tablet by mouth daily. 12/27/10  Yes Earnstine Regal, PA-C  omeprazole (PRILOSEC) 40 MG capsule TAKE 1 CAPSULE(40 MG) BY MOUTH DAILY Patient taking differently: Take 40 mg by mouth daily. 01/04/17  Yes Susy Frizzle, MD  simvastatin (ZOCOR) 40 MG tablet TAKE 1 TABLET BY MOUTH EVERY EVENING Patient taking differently: Take 40 mg by mouth daily at 6 PM. 03/23/21  Yes Susy Frizzle, MD  TRELEGY ELLIPTA 100-62.5-25 MCG/ACT AEPB INHALE 1 PUFF INTO THE LUNGS DAILY 08/08/21  Yes Susy Frizzle, MD  valsartan (DIOVAN) 160 MG tablet TAKE 1 TABLET(160 MG) BY MOUTH DAILY Patient taking differently: Take 160 mg by mouth daily. 03/20/21  Yes Susy Frizzle, MD  doxazosin (CARDURA) 2 MG tablet TAKE 1 TABLET(2 MG) BY MOUTH DAILY Patient not taking: Reported on 10/25/2021 07/08/20   Susy Frizzle, MD  traMADol (ULTRAM) 50 MG tablet TAKE 2 TABLETS(100 MG) BY MOUTH FOUR TIMES DAILY Patient not taking: Reported on 10/24/2021 09/01/21   Susy Frizzle, MD  Allergies    Patient has no known allergies.    Review of Systems   Review of Systems  Physical Exam Updated Vital Signs BP (!) 68/35   Pulse 61   Temp 99.3 F (37.4 C)   Resp 20   Ht '5\' 5"'$  (1.651 m)   Wt 104.5 kg   SpO2 92%   BMI 38.34 kg/m  Physical Exam Vitals and nursing note reviewed.  Constitutional:      General: He is in acute distress.     Appearance: He is well-developed. He is ill-appearing.  HENT:     Head: Normocephalic and atraumatic.     Right Ear: External ear normal.     Left Ear: External ear normal.     Nose: Nose normal.     Mouth/Throat:     Mouth: Mucous  membranes are dry.  Eyes:     Extraocular Movements: Extraocular movements intact.     Conjunctiva/sclera: Conjunctivae normal.     Pupils: Pupils are equal, round, and reactive to light.  Cardiovascular:     Rate and Rhythm: Bradycardia present. Rhythm irregular.     Heart sounds: Normal heart sounds.  Pulmonary:     Effort: Pulmonary effort is normal. No respiratory distress.     Breath sounds: Rales (Right base) present.  Abdominal:     General: There is no distension.     Palpations: Abdomen is soft. There is no mass.     Tenderness: There is no abdominal tenderness. There is no guarding.  Musculoskeletal:        General: No swelling.     Cervical back: Normal range of motion and neck supple.     Right lower leg: Edema (Trace) present.     Left lower leg: Edema (Trace) present.  Skin:    General: Skin is warm and dry.     Capillary Refill: Capillary refill takes less than 2 seconds.     Comments: Abrasion behind right ear  Neurological:     Mental Status: He is alert. Mental status is at baseline.     Comments: Moving all 4 extremities equally  Psychiatric:        Mood and Affect: Mood normal.        Behavior: Behavior normal.     ED Results / Procedures / Treatments   Labs (all labs ordered are listed, but only abnormal results are displayed) Labs Reviewed  CBC - Abnormal; Notable for the following components:      Result Value   MCV 100.5 (*)    Platelets 101 (*)    nRBC 1.1 (*)    All other components within normal limits  COMPREHENSIVE METABOLIC PANEL - Abnormal; Notable for the following components:   Sodium 132 (*)    Potassium 6.1 (*)    CO2 7 (*)    BUN 79 (*)    Creatinine, Ser 5.17 (*)    Calcium 7.0 (*)    Total Protein 6.2 (*)    Albumin 3.1 (*)    Alkaline Phosphatase 190 (*)    GFR, Estimated 11 (*)    Anion gap 16 (*)    All other components within normal limits  URINALYSIS, ROUTINE W REFLEX MICROSCOPIC - Abnormal; Notable for the following  components:   APPearance HAZY (*)    Specific Gravity, Urine >1.030 (*)    Hgb urine dipstick MODERATE (*)    Bilirubin Urine MODERATE (*)    Protein, ur 100 (*)    Leukocytes,Ua  MODERATE (*)    All other components within normal limits  URINALYSIS, MICROSCOPIC (REFLEX) - Abnormal; Notable for the following components:   Bacteria, UA MANY (*)    All other components within normal limits  CBG MONITORING, ED - Abnormal; Notable for the following components:   Glucose-Capillary 131 (*)    All other components within normal limits  TROPONIN I (HIGH SENSITIVITY) - Abnormal; Notable for the following components:   Troponin I (High Sensitivity) 23 (*)    All other components within normal limits  TROPONIN I (HIGH SENSITIVITY) - Abnormal; Notable for the following components:   Troponin I (High Sensitivity) 27 (*)    All other components within normal limits  CULTURE, BLOOD (ROUTINE X 2)  CULTURE, BLOOD (ROUTINE X 2)  RESP PANEL BY RT-PCR (FLU A&B, COVID) ARPGX2  URINE CULTURE  LACTIC ACID, PLASMA  LACTIC ACID, PLASMA  TSH  PROCALCITONIN  PROCALCITONIN  CREATININE, URINE, RANDOM  SODIUM, URINE, RANDOM  CBG MONITORING, ED  I-STAT CHEM 8, ED    EKG EKG Interpretation  Date/Time:  Tuesday November 07 2021 10:15:45 EDT Ventricular Rate:  160 PR Interval:    QRS Duration: 129 QT Interval:    QTC Calculation:   R Axis:   36 Text Interpretation: Wide-QRS tachycardia Paired ventricular premature complexes Nonspecific intraventricular conduction delay Confirmed by Margaretmary Eddy [phone]) on 10/30/2021 1:03:22 PM  Radiology DG Chest 1V REPEAT Same Day  Result Date: 10/28/2021 CLINICAL DATA:  71 year old male with central line placement EXAM: CHEST - 1 VIEW SAME DAY COMPARISON:  11/06/2021 FINDINGS: Cardiomediastinal silhouette unchanged in size and contour. No evidence of central vascular congestion. No interlobular septal thickening. Interval placement of right IJ central venous  catheter with the tip terminating superior vena cava. No pneumothorax or pleural effusion. Coarsened interstitial markings, with no confluent airspace disease. No acute displaced fracture. IMPRESSION: Interval placement of right IJ central venous catheter, with no complicating features Electronically Signed   By: Corrie Mckusick D.O.   On: 10/21/2021 15:57   DG Chest 1 View  Result Date: 11/06/2021 CLINICAL DATA:  Chest pain EXAM: CHEST  1 VIEW COMPARISON:  Chest 12/22/2010 FINDINGS: Cardiac enlargement. Negative for heart failure or edema. Atherosclerotic calcification thoracic aorta. Lungs clear without infiltrate or effusion. IMPRESSION: No active disease. Electronically Signed   By: Franchot Gallo M.D.   On: 10/17/2021 11:43    Procedures .Central Line  Date/Time: 10/19/2021 9:20 PM  Performed by: Fransico Meadow, MD Authorized by: Fransico Meadow, MD   Consent:    Consent obtained:  Verbal   Consent given by:  Patient   Risks discussed:  Arterial puncture, incorrect placement, nerve damage, bleeding, infection and pneumothorax Universal protocol:    Patient identity confirmed:  Verbally with patient Pre-procedure details:    Indication(s): central venous access     Hand hygiene: Hand hygiene performed prior to insertion     Sterile barrier technique: All elements of maximal sterile technique followed     Skin preparation:  Chlorhexidine, alcohol, chlorhexidine with alcohol and povidone-iodine   Skin preparation agent: Skin preparation agent completely dried prior to procedure   Sedation:    Sedation type:  None Anesthesia:    Anesthesia method:  None Procedure details:    Location:  R internal jugular   Patient position:  Supine   Catheter size:  7 Fr   Landmarks identified: yes     Ultrasound guidance: yes     Ultrasound guidance timing: real time  Sterile ultrasound techniques: Sterile gel and sterile probe covers were used     Number of attempts:  1   Successful  placement: yes   Post-procedure details:    Post-procedure:  Dressing applied and line sutured   Assessment:  Blood return through all ports, no pneumothorax on x-ray, free fluid flow and placement verified by x-ray   Procedure completion:  Tolerated well, no immediate complications      EMERGENCY DEPARTMENT [place] CARDIAC EXAM "Study: Limited Ultrasound of the Heart and Pericardium"  INDICATIONS:Dyspnea Multiple views of the heart and pericardium were obtained in real-time with a multi-frequency probe.  PERFORMED [id number] IMAGES ARCHIVED?: No LIMITATIONS:  Body habitus VIEWS USED: Parasternal long axis, Parasternal short axis, and Apical 4 chamber  INTERPRETATION: Cardiac activity present, Pericardial effusion present, Cardiac tamponade absent, and Normal contractility   Medications Ordered in ED Medications  calcium gluconate in NaCl 1-0.675 GM/50ML-% IVPB (  Not Given 11/10/2021 1025)  lidocaine-EPINEPHrine (XYLOCAINE W/EPI) 2 %-1:200000 (PF) injection 10 mL (10 mLs Other Not Given 10/30/2021 1224)  LORazepam (ATIVAN) tablet 1-4 mg (has no administration in time range)    Or  LORazepam (ATIVAN) injection 1-4 mg (has no administration in time range)  thiamine (VITAMIN B1) tablet 100 mg ( Oral See Alternative 11/08/2021 1137)    Or  thiamine (VITAMIN B1) injection 100 mg (100 mg Intravenous Given 46/96/29 5284)  folic acid (FOLVITE) tablet 1 mg (1 mg Oral Given 11/14/2021 1107)  multivitamin with minerals tablet 1 tablet (1 tablet Oral Given 10/26/2021 1107)  sodium zirconium cyclosilicate (LOKELMA) packet 10 g (10 g Oral Given 10/26/2021 1114)  fentaNYL (SUBLIMAZE) injection 25 mcg (25 mcg Intravenous Not Given 10/21/2021 1356)  0.9 %  sodium chloride infusion (250 mLs Intravenous New Bag/Given 11/08/2021 1601)  vancomycin variable dose per unstable renal function (pharmacist dosing) (has no administration in time range)  ceFEPIme (MAXIPIME) 2 g in sodium chloride 0.9 % 100 mL IVPB (has no  administration in time range)  norepinephrine (LEVOPHED) '4mg'$  in 280m (0.016 mg/mL) premix infusion (35 mcg/min Intravenous Rate/Dose Change 10/28/2021 1720)  pantoprazole (PROTONIX) injection 40 mg (has no administration in time range)  heparin injection 5,000 Units (has no administration in time range)  calcium gluconate inj 10% (1 g) URGENT USE ONLY! (1 g Intravenous Given 10/29/2021 1022)  atropine injection 1 mg (1 mg Intravenous Given 10/16/2021 1026)  Tdap (BOOSTRIX) injection 0.5 mL (0.5 mLs Intramuscular Given 11/12/2021 1143)  sodium chloride 0.9 % bolus 500 mL (0 mLs Intravenous Stopped 11/02/2021 1132)  ceFEPIme (MAXIPIME) 2 g in sodium chloride 0.9 % 100 mL IVPB (0 g Intravenous Stopped 11/05/2021 1133)  metroNIDAZOLE (FLAGYL) IVPB 500 mg (0 mg Intravenous Stopped 11/13/2021 1443)  vancomycin (VANCOCIN) IVPB 1000 mg/200 mL premix (0 mg Intravenous Stopped 10/17/2021 1532)  furosemide (LASIX) injection 40 mg (40 mg Intravenous Given 10/18/2021 1123)  sodium chloride 0.9 % bolus 500 mL (0 mLs Intravenous Stopped 10/19/2021 1132)  calcium gluconate inj 10% (1 g) URGENT USE ONLY! (1 g Intravenous Given 10/22/2021 1132)  albuterol (PROVENTIL) (2.5 MG/3ML) 0.083% nebulizer solution 15 mg (15 mg Nebulization Given 11/01/2021 1131)  insulin aspart (novoLOG) injection 5 Units (5 Units Intravenous Given 10/29/2021 1117)    And  dextrose 50 % solution 50 mL (50 mLs Intravenous Given 10/24/2021 1118)  sodium bicarbonate injection 50 mEq (50 mEq Intravenous Given 10/22/2021 1120)  sodium bicarbonate 150 mEq in dextrose 5 % 1,150 mL infusion ( Intravenous Restarted 10/17/2021 1532)  sodium chloride 0.9 %  bolus 500 mL (0 mLs Intravenous Stopped 10/19/2021 1447)  sodium chloride 0.9 % bolus 500 mL (0 mLs Intravenous Stopped 10/28/2021 1224)  vancomycin (VANCOCIN) IVPB 1000 mg/200 mL premix (0 mg Intravenous Stopped 10/30/2021 1716)  sodium chloride 0.9 % bolus 500 mL (0 mLs Intravenous Stopped 11/01/2021 1554)  midazolam (VERSED)  injection 2 mg (2 mg Intravenous Given 11/05/2021 1710)    ED Course/ Medical Decision Making/ A&P Clinical Course as of 10/21/2021 1722  Tue Nov 07, 2021  1102 I-STAT results show BUN over 130, creatinine of 5.6, hemoglobin of 16.7, sodium of 125, potassium over 8.5, chloride 113, calcium 0.77, CO2 10. [RP]  1418 Spoke with Dr. Melvyn Novas who recommends placing a central line in the patient and assessing CVP and possible transfer to [facility]. [RP]    Clinical Course User Index [RP] Fransico Meadow, MD                           Medical Decision Making Amount and/or Complexity of Data Reviewed Labs: ordered. Radiology: ordered.  Risk OTC drugs. Prescription drug management. Decision regarding hospitalization.   CHANCY SMIGIEL is a 71 y.o. male with comorbidities that complicate the patient evaluation including CKD and solitary kidney, diabetes, hypertension, hyperlipidemia who presents with chief complaint of weakness and fall noted to be hypotensive and bradycardic on arrival.  This patient presents to the ED for concern of complaints listed in HPI, this involves an extensive number of treatment options, and is a complaint that carries with it a high risk of complications and morbidity. Disposition including potential need for admission considered.   Initial Ddx:  Acute renal failure, hyperkalemia, heart block, MI, PE, septic shock, adrenal insufficiency, hypothyroidism, ICH, C-spine injury  MDM:  Given the patient's bradycardia and decreased urine output with solitary kidney was concerned about acute renal failure potentially leading to hyperkalemia.  No signs of heart block on his EKG.  Patient denies steroid use and I do not see any on his medication list so feel that adrenal insufficiency unlikely.  Will obtain labs to evaluate for hypothyroidism.  Will obtain work-up for MI and potentially PE if his creatinine will allow.  Will initiate patient on septic shock protocol as well and  started on pressors.  Will obtain imaging to evaluate for traumatic injury though feel this is less likely.  Plan:  Labs I-STAT EKG Chest x-ray COVID and flu Blood cultures Lactic acid Broad-spectrum antibiotics Levophed  ED Summary/Re-evaluation:  He was given calcium gluconate and atropine with improvement of his heart rate.  Also was severely hypothermic so was given fluids and started on broad-spectrum antibiotics.  Initially given the patient's shortness of breath was given several small boluses and then after chest x-ray which did not reveal acute abnormality was given additional boluses of fluids.  Also started on bicarbonate drip for his uremic acidosis and renal failure.  Bedside ultrasound was obtained which did show a pericardial effusion but did not show any evidence of tamponade.  I-STAT labs showed severely elevated potassium so patient was temporized using additional dose of calcium gluconate as well as albuterol, insulin, and Lasix.  However, formal labs show that the patient's potassium was 6.1 with acute on chronic renal failure.  Patient had increasing requirements of Levophed and had a central line placed.  Due to instability patient was unable to have contrasted scans and has dry scans that are pending at this time.  Dr. Melvyn Novas from  the ICU was consulted and saw the patient at the bedside and felt that since his CVP was elevated it was not a matter of simply volume resuscitating the patient and that he should go to [facility] for additional management.  [facility] contacted about transfer.  Nephrology was also contacted early in the patient's stay and we are awaiting their response at this time.  Dispo: ICU   Records reviewed Care Everywhere The following labs were independently interpreted: Chemistry and show AKI I independently reviewed the following imaging with scope of interpretation limited to determining acute life threatening conditions related to emergency care: Chest  x-ray, which revealed  cephalization without acute infiltrate   I personally reviewed and interpreted cardiac monitoring: normal sinus rhythm  I personally reviewed and interpreted the pt's EKG: see above for interpretation  I have reviewed the patients home medications and made adjustments as needed Consults:  critical care and nephrology   Final Clinical Impression(s) / ED Diagnoses Final diagnoses:  Shock ([place])  Fall in home, initial encounter  Acute renal failure superimposed on chronic kidney disease, unspecified acute renal failure type, unspecified CKD stage ([place])    Rx / DC Orders ED Discharge Orders     None      CRITICAL CARE Performed by: Fransico Meadow   Total critical care time: 120 minutes  Critical care time was exclusive of separately billable procedures and treating other patients.  Critical care was necessary to treat or prevent imminent or life-threatening deterioration.  Critical care was time spent personally by me on the following activities: development of treatment plan with patient and/or surrogate as well as nursing, discussions with consultants, evaluation of patient's response to treatment, examination of patient, obtaining history from patient or surrogate, ordering and performing treatments and interventions, ordering and review of laboratory studies, ordering and review of radiographic studies, pulse oximetry and re-evaluation of patient's condition.    Fransico Meadow, MD 10/18/2021 1723    Fransico Meadow, MD 10/24/2021 2121

## 2021-11-07 NOTE — ED Provider Notes (Signed)
Pt signed out by Dr Sharlett Iles pending ICU admission and critical care doctor evaluation here in ED, Dr Melvyn Novas.  Patient treated for hypotension with IV fluids, started on vasopressors.  Central line placed by Dr Sharlett Iles.   Patient has single kidney with AKI here, K 6.1, treated.  *  I spoke to the ICU attending at [facility] who has accepted the patient for transfer.  [facility] reports no available ICU beds at this time.  Therefore the patient will be transported ED to ED to  [facility], and the ICU team should be paged upon his admission.  Accepted to [facility] ED by Dr Mamie Laurel, MD 10/21/2021 [phone]

## 2021-11-08 ENCOUNTER — Inpatient Hospital Stay (HOSPITAL_COMMUNITY): Payer: Medicare Other

## 2021-11-08 DIAGNOSIS — R578 Other shock: Secondary | ICD-10-CM | POA: Diagnosis not present

## 2021-11-08 DIAGNOSIS — R600 Localized edema: Secondary | ICD-10-CM

## 2021-11-08 DIAGNOSIS — R579 Shock, unspecified: Secondary | ICD-10-CM | POA: Diagnosis not present

## 2021-11-08 LAB — POCT I-STAT 7, (LYTES, BLD GAS, ICA,H+H)
Acid-base deficit: 11 mmol/L — ABNORMAL HIGH (ref 0.0–2.0)
Acid-base deficit: 6 mmol/L — ABNORMAL HIGH (ref 0.0–2.0)
Bicarbonate: 14.4 mmol/L — ABNORMAL LOW (ref 20.0–28.0)
Bicarbonate: 18.5 mmol/L — ABNORMAL LOW (ref 20.0–28.0)
Calcium, Ion: 0.95 mmol/L — ABNORMAL LOW (ref 1.15–1.40)
Calcium, Ion: 0.91 mmol/L — ABNORMAL LOW (ref 1.15–1.40)
HCT: 43 % (ref 39.0–52.0)
HCT: 39 % (ref 39.0–52.0)
Hemoglobin: 14.6 g/dL (ref 13.0–17.0)
Hemoglobin: 13.3 g/dL (ref 13.0–17.0)
O2 Saturation: 96 %
O2 Saturation: 91 %
Patient temperature: 98.2
Potassium: 4.4 mmol/L (ref 3.5–5.1)
Potassium: 3.8 mmol/L (ref 3.5–5.1)
Sodium: 133 mmol/L — ABNORMAL LOW (ref 135–145)
Sodium: 135 mmol/L (ref 135–145)
TCO2: 15 mmol/L — ABNORMAL LOW (ref 22–32)
TCO2: 19 mmol/L — ABNORMAL LOW (ref 22–32)
pCO2 arterial: 31.3 mmHg — ABNORMAL LOW (ref 32–48)
pCO2 arterial: 31.6 mmHg — ABNORMAL LOW (ref 32–48)
pH, Arterial: 7.271 — ABNORMAL LOW (ref 7.35–7.45)
pH, Arterial: 7.373 (ref 7.35–7.45)
pO2, Arterial: 92 mmHg (ref 83–108)
pO2, Arterial: 62 mmHg — ABNORMAL LOW (ref 83–108)

## 2021-11-08 LAB — RENAL FUNCTION PANEL
Albumin: 2.8 g/dL — ABNORMAL LOW (ref 3.5–5.0)
Albumin: 2.6 g/dL — ABNORMAL LOW (ref 3.5–5.0)
Anion gap: 16 — ABNORMAL HIGH (ref 5–15)
Anion gap: 12 (ref 5–15)
BUN: 58 mg/dL — ABNORMAL HIGH (ref 8–23)
BUN: 34 mg/dL — ABNORMAL HIGH (ref 8–23)
CO2: 14 mmol/L — ABNORMAL LOW (ref 22–32)
CO2: 20 mmol/L — ABNORMAL LOW (ref 22–32)
Calcium: 6.7 mg/dL — ABNORMAL LOW (ref 8.9–10.3)
Calcium: 7 mg/dL — ABNORMAL LOW (ref 8.9–10.3)
Chloride: 104 mmol/L (ref 98–111)
Chloride: 104 mmol/L (ref 98–111)
Creatinine, Ser: 3.92 mg/dL — ABNORMAL HIGH (ref 0.61–1.24)
Creatinine, Ser: 2.44 mg/dL — ABNORMAL HIGH (ref 0.61–1.24)
GFR, Estimated: 16 mL/min — ABNORMAL LOW (ref >60)
GFR, Estimated: 27 mL/min — ABNORMAL LOW (ref >60)
Glucose, Bld: 189 mg/dL — ABNORMAL HIGH (ref 70–99)
Glucose, Bld: 135 mg/dL — ABNORMAL HIGH (ref 70–99)
Phosphorus: 5 mg/dL — ABNORMAL HIGH (ref 2.5–4.6)
Phosphorus: 3.2 mg/dL (ref 2.5–4.6)
Potassium: 4.4 mmol/L (ref 3.5–5.1)
Potassium: 4 mmol/L (ref 3.5–5.1)
Sodium: 134 mmol/L — ABNORMAL LOW (ref 135–145)
Sodium: 136 mmol/L (ref 135–145)

## 2021-11-08 LAB — HEPARIN LEVEL (UNFRACTIONATED)
Heparin Unfractionated: 0.78 IU/mL — ABNORMAL HIGH (ref 0.30–0.70)
Heparin Unfractionated: 0.78 IU/mL — ABNORMAL HIGH (ref 0.30–0.70)

## 2021-11-08 LAB — CBC
HCT: 40.8 % (ref 39.0–52.0)
Hemoglobin: 13.9 g/dL (ref 13.0–17.0)
MCH: 31.6 pg (ref 26.0–34.0)
MCHC: 34.1 g/dL (ref 30.0–36.0)
MCV: 92.7 fL (ref 80.0–100.0)
Platelets: 78 10*3/uL — ABNORMAL LOW (ref 150–400)
RBC: 4.4 MIL/uL (ref 4.22–5.81)
RDW: 14.6 % (ref 11.5–15.5)
WBC: 11.9 10*3/uL — ABNORMAL HIGH (ref 4.0–10.5)
nRBC: 0.9 % — ABNORMAL HIGH (ref 0.0–0.2)

## 2021-11-08 LAB — GLUCOSE, CAPILLARY
Glucose-Capillary: 103 mg/dL — ABNORMAL HIGH (ref 70–99)
Glucose-Capillary: 107 mg/dL — ABNORMAL HIGH (ref 70–99)
Glucose-Capillary: 129 mg/dL — ABNORMAL HIGH (ref 70–99)
Glucose-Capillary: 131 mg/dL — ABNORMAL HIGH (ref 70–99)
Glucose-Capillary: 132 mg/dL — ABNORMAL HIGH (ref 70–99)
Glucose-Capillary: 188 mg/dL — ABNORMAL HIGH (ref 70–99)

## 2021-11-08 LAB — MAGNESIUM: Magnesium: 2 mg/dL (ref 1.7–2.4)

## 2021-11-08 LAB — COOXEMETRY PANEL
Carboxyhemoglobin: 1.6 % — ABNORMAL HIGH (ref 0.5–1.5)
Methemoglobin: 1.1 % (ref 0.0–1.5)
O2 Saturation: 72.7 %
Total hemoglobin: 13.7 g/dL (ref 12.0–16.0)

## 2021-11-08 LAB — ECHOCARDIOGRAM COMPLETE
Area-P 1/2: 5.34 cm2
Height: 65 in
S' Lateral: 3.5 cm
Weight: 3880.1 oz

## 2021-11-08 LAB — LACTIC ACID, PLASMA: Lactic Acid, Venous: 1.4 mmol/L (ref 0.5–1.9)

## 2021-11-08 LAB — MRSA NEXT GEN BY PCR, NASAL: MRSA by PCR Next Gen: NOT DETECTED

## 2021-11-08 LAB — PROCALCITONIN: Procalcitonin: 0.12 ng/mL

## 2021-11-08 MED ORDER — CALCIUM GLUCONATE-NACL 1-0.675 GM/50ML-% IV SOLN
1.0000 g | Freq: Once | INTRAVENOUS | Status: AC
  Administered 2021-11-08: 1000 mg via INTRAVENOUS
  Filled 2021-11-08: qty 50

## 2021-11-08 MED ORDER — HEPARIN (PORCINE) 25000 UT/250ML-% IV SOLN
1100.0000 [IU]/h | INTRAVENOUS | Status: DC
  Administered 2021-11-08: 1300 [IU]/h via INTRAVENOUS
  Administered 2021-11-08: 1200 [IU]/h via INTRAVENOUS
  Filled 2021-11-08 (×2): qty 250

## 2021-11-08 MED ORDER — PRISMASOL BGK 4/2.5 32-4-2.5 MEQ/L REPLACEMENT SOLN: Status: DC

## 2021-11-08 MED ORDER — SODIUM BICARBONATE 8.4 % IV SOLN: 50.0000 meq | Freq: Once | INTRAVENOUS | Status: AC

## 2021-11-08 MED ORDER — SODIUM CHLORIDE 0.9 % IV SOLN
2.0000 g | Freq: Two times a day (BID) | INTRAVENOUS | Status: DC
  Filled 2021-11-08: qty 12.5

## 2021-11-08 MED ORDER — STERILE WATER FOR INJECTION IV SOLN
INTRAVENOUS | Status: DC
  Filled 2021-11-08 (×3): qty 1000

## 2021-11-08 MED ORDER — ASPIRIN 81 MG PO TBEC
81.0000 mg | DELAYED_RELEASE_TABLET | Freq: Every day | ORAL | Status: DC
  Administered 2021-11-08 – 2021-11-09 (×2): 81 mg via ORAL
  Filled 2021-11-08 (×2): qty 1

## 2021-11-08 MED ORDER — HEPARIN BOLUS VIA INFUSION
4000.0000 [IU] | Freq: Once | INTRAVENOUS | Status: AC
  Administered 2021-11-08: 4000 [IU] via INTRAVENOUS
  Filled 2021-11-08: qty 4000

## 2021-11-08 MED ORDER — SODIUM BICARBONATE 8.4 % IV SOLN
INTRAVENOUS | Status: AC
  Administered 2021-11-08: 50 meq via INTRAVENOUS
  Filled 2021-11-08: qty 50

## 2021-11-08 MED ORDER — PRISMASOL BGK 4/2.5 32-4-2.5 MEQ/L EC SOLN: Status: DC

## 2021-11-08 MED ORDER — SODIUM BICARBONATE 8.4 % IV SOLN
100.0000 meq | Freq: Once | INTRAVENOUS | Status: DC
  Filled 2021-11-08: qty 50

## 2021-11-08 MED ORDER — PERFLUTREN LIPID MICROSPHERE
1.0000 mL | INTRAVENOUS | Status: AC | PRN
  Administered 2021-11-08: 4 mL via INTRAVENOUS

## 2021-11-08 MED ORDER — PIPERACILLIN-TAZOBACTAM 3.375 G IVPB 30 MIN
3.3750 g | Freq: Four times a day (QID) | INTRAVENOUS | Status: DC
  Administered 2021-11-08 – 2021-11-09 (×4): 3.375 g via INTRAVENOUS
  Filled 2021-11-08 (×7): qty 50

## 2021-11-08 MED ORDER — ORAL CARE MOUTH RINSE: 15.0000 mL | OROMUCOSAL | Status: DC | PRN

## 2021-11-08 MED ORDER — VANCOMYCIN HCL IN DEXTROSE 1-5 GM/200ML-% IV SOLN
1000.0000 mg | INTRAVENOUS | Status: DC
  Administered 2021-11-08: 1000 mg via INTRAVENOUS
  Filled 2021-11-08: qty 200

## 2021-11-08 MED ORDER — HYDROCORTISONE SOD SUC (PF) 100 MG IJ SOLR
100.0000 mg | Freq: Two times a day (BID) | INTRAMUSCULAR | Status: DC
  Administered 2021-11-08 – 2021-11-11 (×7): 100 mg via INTRAVENOUS
  Filled 2021-11-08 (×8): qty 2

## 2021-11-08 NOTE — Progress Notes (Signed)
NAME:  Harry James, MRN:  [id number], DOB:  [birth year], LOS: 1 ADMISSION DATE:  11/06/2021, CONSULTATION DATE:  11/08/21 REFERRING MD:  EDP, CHIEF COMPLAINT:  weakness, fall   History of Present Illness:  Harry James is a 71 y.o. M with PMH of CKD stage 3b, solitary kidney, DM, HTN, HL, tobacco use who presented to the ED at [facility] after several days of decreased UOP and generalized weakness with increasing shortness of breath.  He had a fall without LOC on the day of admission and EMS was called.   On chart review it appears his PCP has been concerned about rising creatinine over the last several months and had him stop taking Lasix.   He denies significant chest pain or recent fevers, no abdominal pain, n/v/d.     He was initially hypotensive in the ED with negative CT abd/pelvis, C-spine and head.  Labs showed Creatinine of 5.17, K 6.1, bicarb 7 and AG 16, pH 7.1, pCO2 25, trop 27, WBC 11k, leuks and bacteria on UA, lactic acid 1.6.  He was initially given 3L IVF and broad spectrum antibiotics then Lasix along with insulin and calcium.  He was transferred to [facility] ED where he arrived on Vaso and Levophed 74mg, on RA with tachypnea and still mentating.  PCCM consulted for admission  Pertinent  Medical History   has a past medical history of Chronic kidney disease, Diabetes mellitus, Hyperlipidemia, Hypertension, Polycythemia, secondary (01/25/2014), Tobacco abuse, and Ulcer.   Significant Hospital Events: Including procedures, antibiotic start and stop dates in addition to other pertinent events   10/24 [facility]   In shock and renal failure on levo and vaso.  Started on cefepime, flagyl, vancomycin  10/24 CT Head> no acute findings CT abd/pelvis> IMPRESSION: 1. Markedly limited evaluation on this noncontrast study with respiratory motion artifact. 2. Markedly limited evaluation of a known chronic thoracic and abdominal aortic dissection. Aneurysmal  descending thoracic aorta (4.6 cm) as well as interval increase in size of an aneurysmal infrarenal abdominal aorta (5 cm). Recommend referral to a vascular specialist. This recommendation follows ACR consensus guidelines: White Paper of the ACR Incidental Findings Committee II on Vascular Findings. J Am Coll Radiol 2013; 10:789-794. 3. Aneurysmal bilateral common iliac arteries measuring 0.7 cm on the left and 2.6 cm on the right. 4. Aortic Atherosclerosis (ICD10-I70.0) and Emphysema (ICD10-J43.9). 5. Enlarged right atrium. 6. No acute nonvascular intrathoracic, intra-abdominal, intrapelvic abnormality.  Interim History / Subjective:  Feeling a bit better. Remains on pressors and CRRT. Denies pain.  Objective   Blood pressure 103/80, pulse 64, temperature (!) 97.5 F (36.4 C), resp. rate 18, height '5\' 5"'$  (1.651 m), weight 110 kg, SpO2 98 %.        Intake/Output Summary (Last 24 hours) at 11/08/2021 [id number] data filed at 11/08/2021 0900 Gross per 24 hour  Intake 2096.34 ml  Output 751 ml  Net 1345.34 ml    Filed Weights   10/30/2021 1151 11/08/21 0500  Weight: 104.5 kg 110 kg    Chronically ill appearing man in NAD Ext minimal edema MMM Aox3 Globally weak Heart sounds regular  ABG improved metabolic acidemia Plts down a bit Coox 71 Echo on syngo looks okay  Resolved Hospital Problem list     Assessment & Plan:  Shock- seems more c/w sepsis; RV mildly enlarged on echo; cultures pending Cor pulmonale- new diagnosis but my suspicion is he has longstanding sleep disordered breathing and emphysema  with hypoxemia as well as secondary polycythemia; fortunately coox looks okay Acute on chronic CKD 3b renal failure Hx alcohol abuse Type 2 DM  HTN HLD Chronic thoracic and abdominal aortic dissection Aneurysmal infrarenal abdominal aorta, increased size from prior  - Agree with heparin, f/u echo formal read and LE duplex; may need VQ scan but again think primarily  a group 2 driven process - Bicarb gtt and CRRT, net even - Pressors to MAP 65 - Vanc, zosyn, f/u culture data - Stress steroids - Thiamine/folate, CIWA driven ativan  Best Practice (right click and "Reselect all SmartList Selections" daily)   Diet/type: renal diet DVT prophylaxis: heparin gtt GI prophylaxis: N/A Lines: Central line Foley:  Yes, and it is still needed Code Status:  full code Last date of multidisciplinary goals of care discussion [pending]  38 min cc time Erskine Emery MD PCCM Amion or [phone]

## 2021-11-08 NOTE — Progress Notes (Signed)
  Echocardiogram 2D Echocardiogram has been performed.  Harry James 11/08/2021, 9:39 AM

## 2021-11-08 NOTE — Progress Notes (Signed)
ANTICOAGULATION CONSULT NOTE - Initial Consult  Pharmacy Consult for Heparin Indication: possible VTE  No Known Allergies  Patient Measurements: Height: '5\' 5"'$  (165.1 cm) Weight: 104.5 kg (230 lb 6.1 oz) IBW/kg (Calculated) : 61.5 Heparin Dosing Weight: 85 kg  Vital Signs: Temp: 99 F (37.2 C) (10/24 2200) BP: 145/128 (10/24 2130) Pulse Rate: 30 (10/24 2200)  Labs: Recent Labs    11/06/2021 1101 10/23/2021 1255 11/04/2021 2045 10/25/2021 2110  HGB 13.5  --  13.4 14.6  HCT 43.4  --  41.7 43.0  PLT 101*  --  91*  --   CREATININE 5.17*  --  5.21*  --   CKTOTAL  --   --  160  --   TROPONINIHS 23* 27* 105*  --     Estimated Creatinine Clearance: 14.3 mL/min (A) (by C-G formula based on SCr of 5.21 mg/dL (H)).   Medical History: Past Medical History:  Diagnosis Date   Chronic kidney disease    has one functional kidney   Diabetes mellitus    prediabetes   Hyperlipidemia    Hypertension    Polycythemia, secondary 01/25/2014   Tobacco abuse    Ulcer    perforated duodenal ulcer 12/12    Medications:  No current facility-administered medications on file prior to encounter.   Current Outpatient Medications on File Prior to Encounter  Medication Sig Dispense Refill   albuterol (VENTOLIN HFA) 108 (90 Base) MCG/ACT inhaler Inhale 2 puffs into the lungs every 6 (six) hours as needed for wheezing or shortness of breath. 8.5 g 1   cloNIDine (CATAPRES) 0.3 MG tablet TAKE 1 TABLET(0.3 MG) BY MOUTH TWICE DAILY (Patient taking differently: Take 0.3 mg by mouth 2 (two) times daily.) 180 tablet 0   dapagliflozin propanediol (FARXIGA) 10 MG TABS tablet TAKE 1 TABLET(10 MG) BY MOUTH DAILY BEFORE BREAKFAST (Patient taking differently: Take 10 mg by mouth daily.) 30 tablet 3   Fish Oil OIL Take 1 tablet by mouth daily.     furosemide (LASIX) 80 MG tablet Take 1 tablet (80 mg total) by mouth daily. 90 tablet 3   metoprolol tartrate (LOPRESSOR) 100 MG tablet TAKE 1 TABLET BY MOUTH TWICE DAILY  (Patient taking differently: Take 100 mg by mouth 2 (two) times daily.) 180 tablet 2   Multiple Vitamins-Minerals (MULTIVITAMINS THER. W/MINERALS) TABS Take 1 tablet by mouth daily. 30 each    omeprazole (PRILOSEC) 40 MG capsule TAKE 1 CAPSULE(40 MG) BY MOUTH DAILY (Patient taking differently: Take 40 mg by mouth daily.) 90 capsule 3   TRELEGY ELLIPTA 100-62.5-25 MCG/ACT AEPB INHALE 1 PUFF INTO THE LUNGS DAILY 60 each 3   valsartan (DIOVAN) 160 MG tablet TAKE 1 TABLET(160 MG) BY MOUTH DAILY (Patient taking differently: Take 160 mg by mouth daily.) 90 tablet 3   doxazosin (CARDURA) 2 MG tablet TAKE 1 TABLET(2 MG) BY MOUTH DAILY (Patient not taking: Reported on 11/14/2021) 90 tablet 3   simvastatin (ZOCOR) 40 MG tablet TAKE 1 TABLET BY MOUTH EVERY EVENING (Patient taking differently: Take 40 mg by mouth daily at 6 PM.) 90 tablet 1   traMADol (ULTRAM) 50 MG tablet TAKE 2 TABLETS(100 MG) BY MOUTH FOUR TIMES DAILY (Patient not taking: Reported on 10/24/2021) 60 tablet 0     Assessment: 71 y.o. male with shock, possible PE/DVT, for heparin Goal of Therapy:  Heparin level 0.3-0.7 units/ml Monitor platelets by anticoagulation protocol: Yes   Plan:  Heparin 4000 units IV bolus, then start heparin 1300 units/hr Check heparin level  in 8 hours.   Laura Radilla, Bronson Curb 11/08/2021,12:54 AM

## 2021-11-08 NOTE — Progress Notes (Addendum)
[facility] Progress Note Patient Name: Harry James DOB: [birth year] MRN: [id number]   Date of Service  11/08/2021  HPI/Events of Note  ABG on [place] O2 = 7.271/31.3/92/14.4.  eICU Interventions  Plan: NaHCO3 50 meq IV now. NaHCO3 IV infusion at 75 mL/hour. Repeat ABG at 12 noon.      Intervention Category Major Interventions: Other:  Lakyla Biswas Cornelia Copa 11/08/2021, 6:04 AM

## 2021-11-08 NOTE — Progress Notes (Signed)
ANTICOAGULATION CONSULT NOTE  Pharmacy Consult for Heparin Indication: possible VTE/PE  No Known Allergies  Patient Measurements: Height: '5\' 5"'$  (165.1 cm) Weight: 110 kg (242 lb 8.1 oz) IBW/kg (Calculated) : 61.5 Heparin Dosing Weight: 85 kg  Vital Signs: Temp: 97.3 F (36.3 C) (10/25 2045) Temp Source: Core (10/25 2030) BP: 106/74 (10/25 2030) Pulse Rate: 66 (10/25 2045)  Labs: Recent Labs    11/02/2021 1101 11/13/2021 1255 11/08/2021 2045 10/27/2021 2110 11/08/21 0024 11/08/21 0025 11/08/21 0500 11/08/21 1013 11/08/21 1257 11/08/21 1656 11/08/21 2058  HGB 13.5  --  13.4   < > 13.9  --  14.6  --  13.3  --   --   HCT 43.4  --  41.7   < > 40.8  --  43.0  --  39.0  --   --   PLT 101*  --  91*  --  78*  --   --   --   --   --   --   HEPARINUNFRC  --   --   --   --   --   --   --  0.78*  --   --  0.78*  CREATININE 5.17*  --  5.21*  --   --  3.92*  --   --   --  2.44*  --   CKTOTAL  --   --  160  --   --   --   --   --   --   --   --   TROPONINIHS 23* 27* 105*  --   --   --   --   --   --   --   --    < > = values in this interval not displayed.     Estimated Creatinine Clearance: 31.3 mL/min (A) (by C-G formula based on SCr of 2.44 mg/dL (H)).   Medical History: Past Medical History:  Diagnosis Date   Chronic kidney disease    has one functional kidney   Diabetes mellitus    prediabetes   Hyperlipidemia    Hypertension    Polycythemia, secondary 01/25/2014   Tobacco abuse    Ulcer    perforated duodenal ulcer 12/12    Medications:  No current facility-administered medications on file prior to encounter.   Current Outpatient Medications on File Prior to Encounter  Medication Sig Dispense Refill   albuterol (VENTOLIN HFA) 108 (90 Base) MCG/ACT inhaler Inhale 2 puffs into the lungs every 6 (six) hours as needed for wheezing or shortness of breath. 8.5 g 1   cloNIDine (CATAPRES) 0.3 MG tablet TAKE 1 TABLET(0.3 MG) BY MOUTH TWICE DAILY (Patient taking differently:  Take 0.3 mg by mouth 2 (two) times daily.) 180 tablet 0   dapagliflozin propanediol (FARXIGA) 10 MG TABS tablet TAKE 1 TABLET(10 MG) BY MOUTH DAILY BEFORE BREAKFAST (Patient taking differently: Take 10 mg by mouth daily.) 30 tablet 3   Fish Oil OIL Take 1 tablet by mouth daily.     furosemide (LASIX) 80 MG tablet Take 1 tablet (80 mg total) by mouth daily. 90 tablet 3   metoprolol tartrate (LOPRESSOR) 100 MG tablet TAKE 1 TABLET BY MOUTH TWICE DAILY (Patient taking differently: Take 100 mg by mouth 2 (two) times daily.) 180 tablet 2   Multiple Vitamins-Minerals (MULTIVITAMINS THER. W/MINERALS) TABS Take 1 tablet by mouth daily. 30 each    omeprazole (PRILOSEC) 40 MG capsule TAKE 1 CAPSULE(40 MG) BY MOUTH DAILY (Patient taking differently: Take  40 mg by mouth daily.) 90 capsule 3   TRELEGY ELLIPTA 100-62.5-25 MCG/ACT AEPB INHALE 1 PUFF INTO THE LUNGS DAILY 60 each 3   valsartan (DIOVAN) 160 MG tablet TAKE 1 TABLET(160 MG) BY MOUTH DAILY (Patient taking differently: Take 160 mg by mouth daily.) 90 tablet 3   doxazosin (CARDURA) 2 MG tablet TAKE 1 TABLET(2 MG) BY MOUTH DAILY (Patient not taking: Reported on 11/02/2021) 90 tablet 3   simvastatin (ZOCOR) 40 MG tablet TAKE 1 TABLET BY MOUTH EVERY EVENING (Patient taking differently: Take 40 mg by mouth daily at 6 PM.) 90 tablet 1   traMADol (ULTRAM) 50 MG tablet TAKE 2 TABLETS(100 MG) BY MOUTH FOUR TIMES DAILY (Patient not taking: Reported on 10/19/2021) 60 tablet 0     Assessment: 71 y.o. male who presented with weakness and shortness of breath now with shock requiring vasopressors. Pharmacy consulted for heparin dosing due to possible PE/DVT. Doppler 10/25 showed no evidence of DVT. Per CCM will follow up a V/Q study when stable to evaluate for PE. No bleeding reported. Patient on CRRT.  Heparin level remains elevated at 0.78 units/mL despite decreasing heparin to 1200 units/hr.  Goal of Therapy:  Heparin level 0.3-0.7 units/ml Monitor platelets by  anticoagulation protocol: Yes   Plan:  Decrease heparin to 1100 units/hr Check heparin level in 8 hours Will continue to monitor platelet decline Daily heparin level and CBC  Thank you for allowing pharmacy to participate in this patient's care.  Erskine Speed, PharmD Clinical Pharmacist 11/08/2021 9:48 PM

## 2021-11-08 NOTE — Progress Notes (Signed)
Lower extremity venous has been completed.   Preliminary results in CV Proc.   Harry James 11/08/2021 9:35 AM

## 2021-11-08 NOTE — Progress Notes (Addendum)
Pharmacy Antibiotic Note  Harry James is a 71 y.o. male admitted on 10/18/2021 with  unknown source . Patient with AKI on CKD with Scr 5.17 on admission (baseline 1.78). Patient with solidarity kidney presents with reduced urine output, weakness, and shortness of breath. Pharmacy has been consulted for Vancomycin and Cefepime dosing.  Patient started on CRRT 10/24 PM. WBC up to 11.9 and afebrile. Remains on NE and vaso. Mechanically ventilated. Will adjust dosing for CRRT.  Plan: Vancomycin 1g every 24 hours (~10 mg/kg) Will obtain vanc trough at steady state Cefepime 2gm IV q12h F/U cxs and clinical progress Monitor V/S, labs and levels as indicated  Height: '5\' 5"'$  (165.1 cm) Weight: 110 kg (242 lb 8.1 oz) IBW/kg (Calculated) : 61.5  Temp (24hrs), Avg:98.2 F (36.8 C), Min:93.2 F (34 C), Max:99.3 F (37.4 C)  Recent Labs  Lab 10/29/2021 1101 11/04/2021 1255 10/17/2021 2045 11/08/21 0001 11/08/21 0024 11/08/21 0025  WBC 9.3  --  11.0*  --  11.9*  --   CREATININE 5.17*  --  5.21*  --   --  3.92*  LATICACIDVEN 1.4 1.5 1.6 1.4  --   --      Estimated Creatinine Clearance: 19.5 mL/min (A) (by C-G formula based on SCr of 3.92 mg/dL (H)).    No Known Allergies  Antimicrobials this admission: Vancomycin 10/24 >>  Cefepime 10/24 >>    Microbiology results: 10/24 BCx: NG <24 hours 10/24 UCx: NG <24 hours   MRSA PCR: neg  Thank you for allowing pharmacy to participate in this patient's care.  Reatha Harps, PharmD PGY2 Pharmacy Resident 11/08/2021 7:21 AM Check [email or web address] for unit specific pharmacy number  ADDENDUM:  Pharmacy consulted to transition from Cefepime to Zosyn.  Will start Zosyn 3.375g over 30 min every 6 hours Vanc as stated above  Thank you for allowing pharmacy to participate in this patient's care.  Reatha Harps, PharmD PGY2 Pharmacy Resident 11/08/2021 9:34 AM Check [email or web address] for unit specific pharmacy number

## 2021-11-08 NOTE — ED Notes (Signed)
Order placed for temp foley d/t pt requiring bair hugger d/t rectal temp 93.2 upon arrival to ED, bladder scan performed prior to insertion, insertion with 2 person assist, pt tolerated well, small amount of urine return upon insertion, urine collected for UA and culture upon insertion, catheter tube secured to R Leg with securement device, foley cath bag unclamped and draining with bag below the level of the bladder on a non movable area on the stretcher

## 2021-11-08 NOTE — Progress Notes (Signed)
[facility] Progress Note   Subjective:   admitted overnight for septic shock, severe AKI - started CRRT which is running fine.  He's arousable but not really answering questions.  Objective Vitals:   11/08/21 0615 11/08/21 0630 11/08/21 0645 11/08/21 0800  BP:    103/80  Pulse: 61 63 63 64  Resp: '12 19 17 18  '$ Temp: (!) 96.6 F (35.9 C) (!) 96.6 F (35.9 C) (!) 96.8 F (36 C) (!) 97.5 F (36.4 C)  TempSrc:      SpO2: 99% 99% 98% 98%  Weight:      Height:       Physical Exam General: obese man who is lying in bed sleeping, briefly awakens to shoulder shake Heart: RRR no rub Lungs: coarse BL Abdomen: obese, soft Extremities: no edema Dialysis Access: R femoral temp cath  Additional Objective Labs: Basic Metabolic Panel: Recent Labs  Lab 11/01/2021 1101 11/13/2021 2045 10/24/2021 2110 11/08/21 0025 11/08/21 0500  NA 132* 132* 129* 134* 133*  K 6.1* 5.9* 5.7* 4.4 4.4  CL 109 104  --  104  --   CO2 7* 8*  --  14*  --   GLUCOSE 96 231*  --  189*  --   BUN 79* 74*  --  58*  --   CREATININE 5.17* 5.21*  --  3.92*  --   CALCIUM 7.0* 6.7*  --  6.7*  --   PHOS  --  6.4*  --  5.0*  --    Liver Function Tests: Recent Labs  Lab 10/18/2021 1101 10/20/2021 2045 11/08/21 0025  AST 15 17  --   ALT 18 18  --   ALKPHOS 190* 179*  --   BILITOT 1.0 1.2  --   PROT 6.2* 5.6*  --   ALBUMIN 3.1* 2.8* 2.8*   No results for input(s): "LIPASE", "AMYLASE" in the last 168 hours. CBC: Recent Labs  Lab 11/09/2021 1101 10/28/2021 2045 10/25/2021 2110 11/08/21 0024 11/08/21 0500  WBC 9.3 11.0*  --  11.9*  --   HGB 13.5 13.4 14.6 13.9 14.6  HCT 43.4 41.7 43.0 40.8 43.0  MCV 100.5* 97.7  --  92.7  --   PLT 101* 91*  --  78*  --    Blood Culture    Component Value Date/Time   SDES  10/17/2021 1101    BLOOD LEFT HAND BOTTLES DRAWN AEROBIC AND ANAEROBIC Blood Culture adequate volume   SDES  11/04/2021 1101    BLOOD RIGHT HAND BOTTLES DRAWN AEROBIC AND ANAEROBIC Blood  Culture results may not be optimal due to an inadequate volume of blood received in culture bottles   SPECREQUEST Immunocompromised 11/05/2021 1101   SPECREQUEST Immunocompromised 10/22/2021 1101   CULT  11/04/2021 1101    NO GROWTH < 24 HOURS Performed at [facility], [address], [place], [place] [address]    CULT  10/17/2021 1101    NO GROWTH < 24 HOURS Performed at [facility], [address] [place], [place] [address]    REPTSTATUS PENDING 10/24/2021 1101   REPTSTATUS PENDING 11/09/2021 1101    Cardiac Enzymes: Recent Labs  Lab 10/28/2021 2045  CKTOTAL 160   CBG: Recent Labs  Lab 10/29/2021 1025 10/23/2021 1156 10/18/2021 2238 11/08/21 0455 11/08/21 0815  GLUCAP 88 131* 238* 188* 129*   Iron Studies: No results for input(s): "IRON", "TIBC", "TRANSFERRIN", "FERRITIN" in the last 72 hours. '@lablastinr3'$ @ Studies/Results: DG CHEST PORT 1 VIEW  Result Date: 10/27/2021 CLINICAL  DATA:  Shortness of breath. EXAM: PORTABLE CHEST 1 VIEW COMPARISON:  November 07, 2021 (3:47 p.m.) FINDINGS: There is stable right-sided venous catheter positioning. The cardiac silhouette is mildly enlarged and unchanged in size. There is moderate to marked severity calcification and tortuosity of the descending thoracic aorta with a stable area of distal aneurysmal dilatation. Mild, stable bibasilar atelectasis is seen. There is no evidence of focal consolidation, pleural effusion or pneumothorax. The visualized skeletal structures are unremarkable. IMPRESSION: 1. Mild, stable bibasilar atelectasis. Electronically Signed   By: Virgina Norfolk M.D.   On: 10/27/2021 21:03   CT CHEST ABDOMEN PELVIS WO CONTRAST  Result Date: 10/26/2021 CLINICAL DATA:  hypotension SOB, poor renal function EXAM: CT CHEST, ABDOMEN AND PELVIS WITHOUT CONTRAST TECHNIQUE: Multidetector CT imaging of the chest, abdomen and pelvis was performed following the standard protocol without IV contrast. RADIATION DOSE REDUCTION: This exam  was performed according to the departmental dose-optimization program which includes automated exposure control, adjustment of the mA and/or kV according to patient size and/or use of iterative reconstruction technique. COMPARISON:  CT abdomen pelvis 12/15/2010, CT chest 12/08/2002 report without imaging. FINDINGS: CT CHEST FINDINGS Cardiovascular: Question enlarged right atrium. No significant pericardial effusion. Markedly limited evaluation of a descending thoracic aorta dissection and aneurysm: Measuring up to 4.6 cm. Severe atherosclerotic plaque of the thoracic aorta. Two vessel coronary artery calcifications. Mediastinum/Nodes: No gross hilar adenopathy, noting limited sensitivity for the detection of hilar adenopathy on this noncontrast study. No enlarged mediastinal or axillary lymph nodes. Thyroid gland, trachea, and esophagus demonstrate no significant findings. Lungs/Pleura: Centrilobular emphysematous changes. Limited evaluation due to respiratory motion artifact. No focal consolidation. No pulmonary nodule. No pulmonary mass. No pleural effusion. No pneumothorax. Musculoskeletal: No chest wall abnormality. No suspicious lytic or blastic osseous lesions. No acute displaced fracture with markedly limited evaluation of the sternum and ribs due to motion artifact. Multilevel degenerative changes of the spine. CT ABDOMEN PELVIS FINDINGS Hepatobiliary: No focal liver abnormality. No gallstones, gallbladder wall thickening, or pericholecystic fluid. No biliary dilatation. Pancreas: No focal lesion. Normal pancreatic contour. No surrounding inflammatory changes. No main pancreatic ductal dilatation. Spleen: Normal in size without focal abnormality. Adrenals/Urinary Tract: No adrenal nodule bilaterally. Nonspecific bilateral perinephric stranding. Bilateral renal cortical scarring. Couple punctate calcifications associated with the kidneys may be vascular.No definite nephrolithiasis and no hydronephrosis. No  definite contour-deforming renal mass. No ureterolithiasis or hydroureter. The urinary bladder is decompressed with Foley catheter tip and balloon within the lumen. Stomach/Bowel: Stomach is within normal limits. No evidence of bowel wall thickening or dilatation. Appendix appears normal. Vascular/Lymphatic: Interval increase in size of an infrarenal abdominal aorta aneurysm measuring 5 x 4.1 cm (from 3.9 x 3.2 cm). Aneurysmal bilateral common iliac arteries measuring 0.7 cm on the left and 2.6 cm on the right. Severe atherosclerotic plaque of the aorta and its branches. No abdominal, pelvic, or inguinal lymphadenopathy. Reproductive: Prostate is unremarkable. Other: No intraperitoneal free fluid. No intraperitoneal free gas. No organized fluid collection. Musculoskeletal: Small fat containing umbilical hernia. Diffuse mild subcutaneus soft tissue edema. No suspicious lytic or blastic osseous lesions. No acute displaced fracture. Multilevel degenerative changes of the spine. Severe degenerative changes of the left hip. IMPRESSION: 1. Markedly limited evaluation on this noncontrast study with respiratory motion artifact. 2. Markedly limited evaluation of a known chronic thoracic and abdominal aortic dissection. Aneurysmal descending thoracic aorta (4.6 cm) as well as interval increase in size of an aneurysmal infrarenal abdominal aorta (5 cm). Recommend referral to a vascular  specialist. This recommendation follows ACR consensus guidelines: White Paper of the ACR Incidental Findings Committee II on Vascular Findings. J Am Coll Radiol 2013; 10:789-794. 3. Aneurysmal bilateral common iliac arteries measuring 0.7 cm on the left and 2.6 cm on the right. 4. Aortic Atherosclerosis (ICD10-I70.0) and Emphysema (ICD10-J43.9). 5. Enlarged right atrium. 6. No acute nonvascular intrathoracic, intra-abdominal, intrapelvic abnormality. Electronically Signed   By: Iven Finn M.D.   On: 11/10/2021 17:53   CT Cervical Spine Wo  Contrast  Result Date: 10/20/2021 CLINICAL DATA:  Weakness, confusion, trauma EXAM: CT CERVICAL SPINE WITHOUT CONTRAST TECHNIQUE: Multidetector CT imaging of the cervical spine was performed without intravenous contrast. Multiplanar CT image reconstructions were also generated. RADIATION DOSE REDUCTION: This exam was performed according to the departmental dose-optimization program which includes automated exposure control, adjustment of the mA and/or kV according to patient size and/or use of iterative reconstruction technique. COMPARISON:  None Available. FINDINGS: Evaluation of the cervical spine is severely limited by patient motion throughout the study. Despite repeat imaging, evaluation of the C4 through C7 levels is severely limited and nondiagnostic. C1 through C3 vertebral bodies are in normal alignment, with no evidence of acute fracture. Soft tissues of the skull base and within the upper cervical spine are unremarkable. Evaluation of the central canal is limited by motion. Visualized portions of the lung apices are clear. IMPRESSION: 1. Extremely limited evaluation due to patient motion throughout the exam. This is a nondiagnostic evaluation of the C4 through C7 vertebral bodies. 2. Unremarkable appearance of the C1 through C3 vertebral bodies. If cervical spine fracture remains a clinical concern, repeat CT cervical spine may be useful when the patient is able to fully cooperate with the study. Electronically Signed   By: Randa Ngo M.D.   On: 10/17/2021 17:37   CT Head Wo Contrast  Result Date: 10/16/2021 CLINICAL DATA:  Head trauma, loss of consciousness, weakness, confusion EXAM: CT HEAD WITHOUT CONTRAST TECHNIQUE: Contiguous axial images were obtained from the base of the skull through the vertex without intravenous contrast. RADIATION DOSE REDUCTION: This exam was performed according to the departmental dose-optimization program which includes automated exposure control, adjustment of  the mA and/or kV according to patient size and/or use of iterative reconstruction technique. COMPARISON:  None Available. FINDINGS: Evaluation is limited by patient motion throughout the exam. Brain: No acute infarct or hemorrhage. Lateral ventricles and midline structures are grossly unremarkable. No acute extra-axial fluid collections. No mass effect. Vascular: No hyperdense vessel or unexpected calcification. Skull: Normal. Negative for fracture or focal lesion. Sinuses/Orbits: No acute finding. Other: None. IMPRESSION: 1. Limited study due to patient motion throughout the exam. 2. No acute intracranial process. Electronically Signed   By: Randa Ngo M.D.   On: 11/06/2021 17:35   DG Chest 1V REPEAT Same Day  Result Date: 10/23/2021 CLINICAL DATA:  71 year old male with central line placement EXAM: CHEST - 1 VIEW SAME DAY COMPARISON:  11/06/2021 FINDINGS: Cardiomediastinal silhouette unchanged in size and contour. No evidence of central vascular congestion. No interlobular septal thickening. Interval placement of right IJ central venous catheter with the tip terminating superior vena cava. No pneumothorax or pleural effusion. Coarsened interstitial markings, with no confluent airspace disease. No acute displaced fracture. IMPRESSION: Interval placement of right IJ central venous catheter, with no complicating features Electronically Signed   By: Corrie Mckusick D.O.   On: 10/26/2021 15:57   DG Chest 1 View  Result Date: 11/10/2021 CLINICAL DATA:  Chest pain EXAM: CHEST  1  VIEW COMPARISON:  Chest 12/22/2010 FINDINGS: Cardiac enlargement. Negative for heart failure or edema. Atherosclerotic calcification thoracic aorta. Lungs clear without infiltrate or effusion. IMPRESSION: No active disease. Electronically Signed   By: Franchot Gallo M.D.   On: 11/01/2021 11:43   Medications:   prismasol BGK 4/2.5      prismasol BGK 4/2.5     sodium chloride Stopped (10/25/2021 2114)   ceFEPime (MAXIPIME) IV      heparin 1,300 Units/hr (11/08/21 0800)   norepinephrine (LEVOPHED) Adult infusion 19 mcg/min (11/08/21 0800)   prismasol BGK 4/2.5     sodium bicarbonate 150 mEq in sterile water 1,150 mL infusion 75 mL/hr at 11/08/21 0805   vancomycin     vasopressin 0.03 Units/min (11/08/21 0800)    aspirin EC  81 mg Oral Daily   Chlorhexidine Gluconate Cloth  6 each Topical Daily   fentaNYL (SUBLIMAZE) injection  25 mcg Intravenous STAT   folic acid  1 mg Oral Daily   insulin aspart  0-15 Units Subcutaneous Q4H   lidocaine-EPINEPHrine  10 mL Other Once   multivitamin with minerals  1 tablet Oral Daily   pantoprazole (PROTONIX) IV  40 mg Intravenous Q24H   sodium chloride flush  10-40 mL Intracatheter Q12H   thiamine  100 mg Oral Daily   Or   thiamine  100 mg Intravenous Daily   vancomycin variable dose per unstable renal function (pharmacist dosing)   Does not apply See admin instructions    Assessment/Recommendations:    AKI on CKD 3 (bl Cr~1.7-1.8) -AKI secondary to ischemic ATN in the setting of shock with concomitant ARB/SGLT2i use -given severe acidemia & hyperK, start CRRT last PM. Appreciate CCM's assistance with temp line placement -K this AM in 4s so switch from 2K to 4K dialysate -Run net even for now given not grossly overloaded and decent pressor requirement currently -Avoid nephrotoxic medications including NSAIDs and iodinated intravenous contrast exposure unless the latter is absolutely indicated.  Preferred narcotic agents for pain control are hydromorphone, fentanyl, and methadone. Morphine should not be used. Avoid Baclofen and avoid oral sodium phosphate and magnesium citrate based laxatives / bowel preps. Continue strict Input and Output monitoring. Will monitor the patient closely with you and intervene or adjust therapy as indicated by changes in clinical status/labs    Shock -pressor support per CCM.    Hyperkalemia: resolved with CRRT   Anion Gap Metabolic  acidosis -likely stemming from AKI but there is a possibility of euglycemic DKA in the context of Farxiga, CRRT as above. -lactate WNL -Primary adding bicarb gtt too   Possible sepsis -UA abnormal, Ucx pending. On empiric abx--per primary service  Jannifer Hick MD 11/08/2021, 8:37 AM  [facility] Pager: [phone]

## 2021-11-08 NOTE — Progress Notes (Addendum)
ANTICOAGULATION CONSULT NOTE - Follow-up Consult  Pharmacy Consult for Heparin Indication: possible VTE/PE  No Known Allergies  Patient Measurements: Height: '5\' 5"'$  (165.1 cm) Weight: 110 kg (242 lb 8.1 oz) IBW/kg (Calculated) : 61.5 Heparin Dosing Weight: 85 kg  Vital Signs: Temp: 98.4 F (36.9 C) (10/25 1000) Temp Source: Core (10/25 0545) BP: 121/70 (10/25 1000) Pulse Rate: 65 (10/25 1000)  Labs: Recent Labs    11/03/2021 1101 10/17/2021 1255 10/28/2021 2045 10/27/2021 2110 11/08/21 0024 11/08/21 0025 11/08/21 0500 11/08/21 1013  HGB 13.5  --  13.4 14.6 13.9  --  14.6  --   HCT 43.4  --  41.7 43.0 40.8  --  43.0  --   PLT 101*  --  91*  --  78*  --   --   --   HEPARINUNFRC  --   --   --   --   --   --   --  0.78*  CREATININE 5.17*  --  5.21*  --   --  3.92*  --   --   CKTOTAL  --   --  160  --   --   --   --   --   TROPONINIHS 23* 27* 105*  --   --   --   --   --      Estimated Creatinine Clearance: 19.5 mL/min (A) (by C-G formula based on SCr of 3.92 mg/dL (H)).   Medical History: Past Medical History:  Diagnosis Date   Chronic kidney disease    has one functional kidney   Diabetes mellitus    prediabetes   Hyperlipidemia    Hypertension    Polycythemia, secondary 01/25/2014   Tobacco abuse    Ulcer    perforated duodenal ulcer 12/12    Medications:  No current facility-administered medications on file prior to encounter.   Current Outpatient Medications on File Prior to Encounter  Medication Sig Dispense Refill   albuterol (VENTOLIN HFA) 108 (90 Base) MCG/ACT inhaler Inhale 2 puffs into the lungs every 6 (six) hours as needed for wheezing or shortness of breath. 8.5 g 1   cloNIDine (CATAPRES) 0.3 MG tablet TAKE 1 TABLET(0.3 MG) BY MOUTH TWICE DAILY (Patient taking differently: Take 0.3 mg by mouth 2 (two) times daily.) 180 tablet 0   dapagliflozin propanediol (FARXIGA) 10 MG TABS tablet TAKE 1 TABLET(10 MG) BY MOUTH DAILY BEFORE BREAKFAST (Patient taking  differently: Take 10 mg by mouth daily.) 30 tablet 3   Fish Oil OIL Take 1 tablet by mouth daily.     furosemide (LASIX) 80 MG tablet Take 1 tablet (80 mg total) by mouth daily. 90 tablet 3   metoprolol tartrate (LOPRESSOR) 100 MG tablet TAKE 1 TABLET BY MOUTH TWICE DAILY (Patient taking differently: Take 100 mg by mouth 2 (two) times daily.) 180 tablet 2   Multiple Vitamins-Minerals (MULTIVITAMINS THER. W/MINERALS) TABS Take 1 tablet by mouth daily. 30 each    omeprazole (PRILOSEC) 40 MG capsule TAKE 1 CAPSULE(40 MG) BY MOUTH DAILY (Patient taking differently: Take 40 mg by mouth daily.) 90 capsule 3   TRELEGY ELLIPTA 100-62.5-25 MCG/ACT AEPB INHALE 1 PUFF INTO THE LUNGS DAILY 60 each 3   valsartan (DIOVAN) 160 MG tablet TAKE 1 TABLET(160 MG) BY MOUTH DAILY (Patient taking differently: Take 160 mg by mouth daily.) 90 tablet 3   doxazosin (CARDURA) 2 MG tablet TAKE 1 TABLET(2 MG) BY MOUTH DAILY (Patient not taking: Reported on 10/30/2021) 90 tablet 3  simvastatin (ZOCOR) 40 MG tablet TAKE 1 TABLET BY MOUTH EVERY EVENING (Patient taking differently: Take 40 mg by mouth daily at 6 PM.) 90 tablet 1   traMADol (ULTRAM) 50 MG tablet TAKE 2 TABLETS(100 MG) BY MOUTH FOUR TIMES DAILY (Patient not taking: Reported on 10/27/2021) 60 tablet 0     Assessment: 71 y.o. male who presented with weakness and shortness of breath now with shock requiring vasopressors. Pharmacy consulted for heparin dosing due to possible PE/DVT. Doppler 10/25 showed no evidence of DVT. Per CCM will follow up a V/Q study when stable to evaluate for PE. No bleeding reported. Patient on CRRT.  First heparin level after starting the infusion at 1300 units/hr is supratherapeutic at 0.78. Hgb is stable and platelet count continues to decline to 78.    Goal of Therapy:  Heparin level 0.3-0.7 units/ml Monitor platelets by anticoagulation protocol: Yes   Plan:  Decrease heparin to 1200 units/hr Check heparin level in 8 hours Will  continue to monitor platelet decline Daily heparin level and CBC  Thank you for allowing pharmacy to participate in this patient's care.  Reatha Harps, PharmD PGY2 Pharmacy Resident 11/08/2021 12:38 PM Check [email or web address] for unit specific pharmacy number

## 2021-11-09 ENCOUNTER — Inpatient Hospital Stay (HOSPITAL_COMMUNITY): Payer: Medicare Other

## 2021-11-09 DIAGNOSIS — I2609 Other pulmonary embolism with acute cor pulmonale: Secondary | ICD-10-CM | POA: Insufficient documentation

## 2021-11-09 DIAGNOSIS — N189 Chronic kidney disease, unspecified: Secondary | ICD-10-CM | POA: Diagnosis not present

## 2021-11-09 DIAGNOSIS — N179 Acute kidney failure, unspecified: Secondary | ICD-10-CM | POA: Diagnosis not present

## 2021-11-09 DIAGNOSIS — R579 Shock, unspecified: Secondary | ICD-10-CM | POA: Diagnosis not present

## 2021-11-09 LAB — CBC
HCT: 34.3 % — ABNORMAL LOW (ref 39.0–52.0)
Hemoglobin: 11.6 g/dL — ABNORMAL LOW (ref 13.0–17.0)
MCH: 31.4 pg (ref 26.0–34.0)
MCHC: 33.8 g/dL (ref 30.0–36.0)
MCV: 93 fL (ref 80.0–100.0)
Platelets: 59 10*3/uL — ABNORMAL LOW (ref 150–400)
RBC: 3.69 MIL/uL — ABNORMAL LOW (ref 4.22–5.81)
RDW: 14.6 % (ref 11.5–15.5)
WBC: 9.9 10*3/uL (ref 4.0–10.5)
nRBC: 0.3 % — ABNORMAL HIGH (ref 0.0–0.2)

## 2021-11-09 LAB — RENAL FUNCTION PANEL
Albumin: 2.4 g/dL — ABNORMAL LOW (ref 3.5–5.0)
Albumin: 2.2 g/dL — ABNORMAL LOW (ref 3.5–5.0)
Anion gap: 11 (ref 5–15)
Anion gap: 11 (ref 5–15)
BUN: 23 mg/dL (ref 8–23)
BUN: 17 mg/dL (ref 8–23)
CO2: 23 mmol/L (ref 22–32)
CO2: 23 mmol/L (ref 22–32)
Calcium: 7 mg/dL — ABNORMAL LOW (ref 8.9–10.3)
Calcium: 6.4 mg/dL — CL (ref 8.9–10.3)
Chloride: 104 mmol/L (ref 98–111)
Chloride: 106 mmol/L (ref 98–111)
Creatinine, Ser: 1.75 mg/dL — ABNORMAL HIGH (ref 0.61–1.24)
Creatinine, Ser: 1.33 mg/dL — ABNORMAL HIGH (ref 0.61–1.24)
GFR, Estimated: 41 mL/min — ABNORMAL LOW (ref >60)
GFR, Estimated: 57 mL/min — ABNORMAL LOW (ref >60)
Glucose, Bld: 126 mg/dL — ABNORMAL HIGH (ref 70–99)
Glucose, Bld: 103 mg/dL — ABNORMAL HIGH (ref 70–99)
Phosphorus: 2.5 mg/dL (ref 2.5–4.6)
Phosphorus: 2.1 mg/dL — ABNORMAL LOW (ref 2.5–4.6)
Potassium: 4.1 mmol/L (ref 3.5–5.1)
Potassium: 4 mmol/L (ref 3.5–5.1)
Sodium: 138 mmol/L (ref 135–145)
Sodium: 140 mmol/L (ref 135–145)

## 2021-11-09 LAB — COOXEMETRY PANEL
Carboxyhemoglobin: 1.4 % (ref 0.5–1.5)
Carboxyhemoglobin: 2.6 % — ABNORMAL HIGH (ref 0.5–1.5)
Methemoglobin: <0.7 % (ref 0.0–1.5)
Methemoglobin: <0.7 % (ref 0.0–1.5)
O2 Saturation: 57.3 %
O2 Saturation: 69.7 %
Total hemoglobin: 11.1 g/dL — ABNORMAL LOW (ref 12.0–16.0)
Total hemoglobin: 10.4 g/dL — ABNORMAL LOW (ref 12.0–16.0)

## 2021-11-09 LAB — APTT: aPTT: 53 seconds — ABNORMAL HIGH (ref 24–36)

## 2021-11-09 LAB — GLUCOSE, CAPILLARY
Glucose-Capillary: 118 mg/dL — ABNORMAL HIGH (ref 70–99)
Glucose-Capillary: 120 mg/dL — ABNORMAL HIGH (ref 70–99)
Glucose-Capillary: 122 mg/dL — ABNORMAL HIGH (ref 70–99)
Glucose-Capillary: 117 mg/dL — ABNORMAL HIGH (ref 70–99)
Glucose-Capillary: 107 mg/dL — ABNORMAL HIGH (ref 70–99)

## 2021-11-09 LAB — PROCALCITONIN: Procalcitonin: 0.13 ng/mL

## 2021-11-09 LAB — LACTIC ACID, PLASMA: Lactic Acid, Venous: 1 mmol/L (ref 0.5–1.9)

## 2021-11-09 LAB — HEPARIN LEVEL (UNFRACTIONATED): Heparin Unfractionated: 0.33 IU/mL (ref 0.30–0.70)

## 2021-11-09 LAB — MAGNESIUM: Magnesium: 2.2 mg/dL (ref 1.7–2.4)

## 2021-11-09 MED ORDER — DOBUTAMINE IN D5W 4-5 MG/ML-% IV SOLN
INTRAVENOUS | Status: AC
  Administered 2021-11-09: 2.5 ug/kg/min via INTRAVENOUS
  Filled 2021-11-09: qty 250

## 2021-11-09 MED ORDER — TECHNETIUM TO 99M ALBUMIN AGGREGATED
3.9000 | Freq: Once | INTRAVENOUS | Status: AC | PRN
  Administered 2021-11-09: 3.9 via INTRAVENOUS

## 2021-11-09 MED ORDER — DOBUTAMINE IN D5W 4-5 MG/ML-% IV SOLN: 2.5000 ug/kg/min | INTRAVENOUS | Status: DC

## 2021-11-09 MED ORDER — ARGATROBAN 50 MG/50ML IV SOLN
0.5000 ug/kg/min | INTRAVENOUS | Status: DC
  Administered 2021-11-09 – 2021-11-10 (×3): 0.5 ug/kg/min via INTRAVENOUS
  Filled 2021-11-09 (×5): qty 50

## 2021-11-09 MED ORDER — NICOTINE 14 MG/24HR TD PT24
14.0000 mg | MEDICATED_PATCH | Freq: Every day | TRANSDERMAL | Status: DC
  Administered 2021-11-09 – 2021-11-11 (×3): 14 mg via TRANSDERMAL
  Filled 2021-11-09 (×3): qty 1

## 2021-11-09 MED ORDER — MELATONIN 3 MG PO TABS
3.0000 mg | ORAL_TABLET | Freq: Every evening | ORAL | Status: DC | PRN
  Administered 2021-11-09: 3 mg via ORAL
  Filled 2021-11-09: qty 1

## 2021-11-09 NOTE — Consult Note (Addendum)
Advanced Heart Failure Team Consult Note   Primary Physician: Susy Frizzle, MD PCP-Cardiologist:  None  Reason for Consultation: Shock, HFpEF with RV failure  HPI:    Harry James is seen today for evaluation of shock and HFpEF with RV failure at the request of Dr. Broadus John, NP with CCM. 71 y.o. male with history of DM II, HLD, HTN, CKD III, prior tobacco use (quit a few months ago), morbid obesity, hx perforated duodenal ulcer, hx alcohol use, hx type B aortic dissection distal to left subclavian involving left renal artery and extending down into the iliac arteries (2004).  Presented to ED via [facility] on 11/13/2021 with weakness, fall, dyspnea and decreased urine output. Had stopped lasix in July at direction of PCP d/t renal function. CT head no acute findings. CT CA/P with evidence of COPD, aneurysmal descending thoracic aorta (4.6 cm), infrarenal AAA measuring 5 cm, aneurysmal iliac arteries (exam limited by motion artifact). He was transferred to [facility] d/t severe metabolic acidosis, shock w/ hypothermia requiring addition of NE and Vaso, and AKI on CKD with Scr 5.17, K 6.1. Started on broad spectrum antibiotics. Nephrology consulted and was started on CRRT.  CCM has been weaning pressors and discontinued antibiotics. Bcx and procal negative. Growing concern that shock may be cardiogenic d/t RV failure w/ cardiorenal syndrome.  Echo 11/08/21: EF 60-65%, RV severely dilated with severely reduced fxn, RVSP 58 mmHg, severe RAE, moderate LAE, dilated IVC  V/Q scan pending  CO-OX 57% this am. CCM attempted to add DBA 2.5 mcg/kg/min. Patient went into AF with RVR and inotrope support discontinued.  Advanced Heart Failure consulted to assist with management of RV failure.  He's had significant functional impairment over the last year. Struggles to get out of the house and grocery shop. Reports shortness of breath and increasing trouble getting around the house for about  a month.  Lives alone in [place], a friend helps him get groceries.  Review of Systems: [y] = yes, '[ ]'$  = no   General: Weight gain '[ ]'$ ; Weight loss '[ ]'$ ; Anorexia '[ ]'$ ; Fatigue [Y]; Fever '[ ]'$ ; Chills '[ ]'$ ; Weakness [Y]  Cardiac: Chest pain/pressure '[ ]'$ ; Resting SOB [Y]; Exertional SOB [Y]; Orthopnea [Y]; Pedal Edema '[ ]'$ ; Palpitations '[ ]'$ ; Syncope '[ ]'$ ; Presyncope '[ ]'$ ; Paroxysmal nocturnal dyspnea'[ ]'$   Pulmonary: Cough '[ ]'$ ; Wheezing'[ ]'$ ; Hemoptysis'[ ]'$ ; Sputum '[ ]'$ ; Snoring '[ ]'$   GI: Vomiting'[ ]'$ ; Dysphagia'[ ]'$ ; Melena'[ ]'$ ; Hematochezia '[ ]'$ ; Heartburn'[ ]'$ ; Abdominal pain '[ ]'$ ; Constipation '[ ]'$ ; Diarrhea '[ ]'$ ; BRBPR '[ ]'$   GU: Hematuria'[ ]'$ ; Dysuria '[ ]'$ ; Nocturia'[ ]'$   Vascular: Pain in legs with walking '[ ]'$ ; Pain in feet with lying flat '[ ]'$ ; Non-healing sores '[ ]'$ ; Stroke '[ ]'$ ; TIA '[ ]'$ ; Slurred speech '[ ]'$ ;  Neuro: Headaches'[ ]'$ ; Vertigo'[ ]'$ ; Seizures'[ ]'$ ; Paresthesias'[ ]'$ ;Blurred vision '[ ]'$ ; Diplopia '[ ]'$ ; Vision changes '[ ]'$   Ortho/Skin: Arthritis '[ ]'$ ; Joint pain '[ ]'$ ; Muscle pain '[ ]'$ ; Joint swelling '[ ]'$ ; Back Pain '[ ]'$ ; Rash '[ ]'$   Psych: Depression'[ ]'$ ; Anxiety'[ ]'$   Heme: Bleeding problems '[ ]'$ ; Clotting disorders '[ ]'$ ; Anemia '[ ]'$   Endocrine: Diabetes [Y]; Thyroid dysfunction'[ ]'$   Home Medications Prior to Admission medications   Medication Sig Start Date End Date Taking? Authorizing Provider  albuterol (VENTOLIN HFA) 108 (90 Base) MCG/ACT inhaler Inhale 2 puffs into the lungs every 6 (six) hours as needed for wheezing or shortness of breath.  08/03/21  Yes Susy Frizzle, MD  cloNIDine (CATAPRES) 0.3 MG tablet TAKE 1 TABLET(0.3 MG) BY MOUTH TWICE DAILY Patient taking differently: Take 0.3 mg by mouth 2 (two) times daily. 07/24/21  Yes Susy Frizzle, MD  dapagliflozin propanediol (FARXIGA) 10 MG TABS tablet TAKE 1 TABLET(10 MG) BY MOUTH DAILY BEFORE BREAKFAST Patient taking differently: Take 10 mg by mouth daily. 10/26/21  Yes Susy Frizzle, MD  Fish Oil OIL Take 1 tablet by mouth daily.   Yes [provider]  furosemide (LASIX) 80 MG tablet Take 1 tablet (80 mg total) by mouth daily. 09/28/20  Yes Susy Frizzle, MD  metoprolol tartrate (LOPRESSOR) 100 MG tablet TAKE 1 TABLET BY MOUTH TWICE DAILY Patient taking differently: Take 100 mg by mouth 2 (two) times daily. 09/15/21  Yes Susy Frizzle, MD  Multiple Vitamins-Minerals (MULTIVITAMINS THER. W/MINERALS) TABS Take 1 tablet by mouth daily. 12/27/10  Yes Earnstine Regal, PA-C  omeprazole (PRILOSEC) 40 MG capsule TAKE 1 CAPSULE(40 MG) BY MOUTH DAILY Patient taking differently: Take 40 mg by mouth daily. 01/04/17  Yes Susy Frizzle, MD  TRELEGY ELLIPTA 100-62.5-25 MCG/ACT AEPB INHALE 1 PUFF INTO THE LUNGS DAILY 08/08/21  Yes Susy Frizzle, MD  valsartan (DIOVAN) 160 MG tablet TAKE 1 TABLET(160 MG) BY MOUTH DAILY Patient taking differently: Take 160 mg by mouth daily. 03/20/21  Yes Susy Frizzle, MD  doxazosin (CARDURA) 2 MG tablet TAKE 1 TABLET(2 MG) BY MOUTH DAILY Patient not taking: Reported on 10/20/2021 07/08/20   Susy Frizzle, MD  simvastatin (ZOCOR) 40 MG tablet TAKE 1 TABLET BY MOUTH EVERY EVENING Patient taking differently: Take 40 mg by mouth daily at 6 PM. 03/23/21   Susy Frizzle, MD  traMADol (ULTRAM) 50 MG tablet TAKE 2 TABLETS(100 MG) BY MOUTH FOUR TIMES DAILY Patient not taking: Reported on 10/28/2021 09/01/21   Susy Frizzle, MD    Past Medical History: Past Medical History:  Diagnosis Date   Chronic kidney disease    has one functional kidney   Diabetes mellitus    prediabetes   Hyperlipidemia    Hypertension    Polycythemia, secondary 01/25/2014   Tobacco abuse    Ulcer    perforated duodenal ulcer 12/12    Past Surgical History: Past Surgical History:  Procedure Laterality Date   CYSTECTOMY  under tongue   LAPAROTOMY  12/16/2010   [name] patch perf du Procedure: EXPLORATORY LAPAROTOMY;  Surgeon: [name], MD;  Location: [facility];  Service: General;  Laterality: N/A;     Family History: No family history on file.  Social History: Social History   Socioeconomic History   Marital status: Divorced    Spouse name: Not on file   Number of children: Not on file   Years of education: Not on file   Highest education level: Not on file  Occupational History   Not on file  Tobacco Use   Smoking status: Every Day    Packs/day: 1.00    Types: Cigarettes   Smokeless tobacco: Never  Substance and Sexual Activity   Alcohol use: Yes    Comment: daily   Drug use: No   Sexual activity: Not on file  Other Topics Concern   Not on file  Social History Narrative   Not on file   Social Determinants of Health   Financial Resource Strain: Low Risk  (07/01/2020)   Overall Financial Resource Strain (CARDIA)    Difficulty of Paying Living Expenses:  Not hard at all  Food Insecurity: No Food Insecurity (11/09/2021)   Hunger Vital Sign    Worried About Running Out of Food in the Last Year: Never true    Ran Out of Food in the Last Year: Never true  Transportation Needs: No Transportation Needs (11/09/2021)   PRAPARE - Hydrologist (Medical): No    Lack of Transportation (Non-Medical): No  Physical Activity: Inactive (07/01/2020)   Exercise Vital Sign    Days of Exercise per Week: 0 days    Minutes of Exercise per Session: 0 min  Stress: No Stress Concern Present (07/01/2020)   [place]    Feeling of Stress : Not at all  Social Connections: Socially Isolated (07/01/2020)   Social Connection and Isolation Panel [NHANES]    Frequency of Communication with Friends and Family: Twice a week    Frequency of Social Gatherings with Friends and Family: Once a week    Attends Religious Services: Never    Marine scientist or Organizations: No    Attends Music therapist: Never    Marital Status: Divorced    Allergies:  No Known Allergies  Objective:     Vital Signs:   Temp:  [97.2 F (36.2 C)-99 F (37.2 C)] 97.2 F (36.2 C) (10/26 1311) Pulse Rate:  [62-114] 72 (10/26 1311) Resp:  [8-25] 22 (10/26 1311) BP: (82-115)/(48-89) 88/61 (10/26 1300) SpO2:  [86 %-100 %] 86 % (10/26 1311) Arterial Line BP: (96-159)/(43-72) 109/43 (10/26 1311) Weight:  [110.1 kg] 110.1 kg (10/26 0500) Last BM Date : 11/09/21  Weight change: Filed Weights   11/01/2021 1151 11/08/21 0500 11/09/21 0500  Weight: 104.5 kg 110 kg 110.1 kg    Intake/Output:   Intake/Output Summary (Last 24 hours) at 11/09/2021 1421 Last data filed at 11/09/2021 1300 Gross per 24 hour  Intake 2486.37 ml  Output 2661 ml  Net -174.63 ml      Physical Exam    General:  Frail, chronically ill appearing HEENT: + ecchymoses surrounding right eye Neck: supple. JVP 14-16 +. Carotids 2+ bilat; no bruits.  Cor: PMI nondisplaced. Regular rate & rhythm. No rubs, gallops or murmurs. Lungs: diminished Abdomen: obese, soft, nontender, nondistended.  Extremities: no cyanosis, clubbing, rash, 1+ edema, excoriations present on bilateral lower extremities, thickening of skin involving left foot Neuro: alert & orientedx3, cranial nerves grossly intact. moves all 4 extremities w/o difficulty. Affect pleasant   Telemetry   Brief Afib this afternoon, now SR 90s  Labs   Basic Metabolic Panel: Recent Labs  Lab 11/13/2021 1101 10/27/2021 2045 10/16/2021 2110 11/08/21 0024 11/08/21 0025 11/08/21 0500 11/08/21 1257 11/08/21 1656 11/09/21 0436  NA 132* 132*   < >  --  134* 133* 135 136 138  K 6.1* 5.9*   < >  --  4.4 4.4 3.8 4.0 4.1  CL 109 104  --   --  104  --   --  104 104  CO2 7* 8*  --   --  14*  --   --  20* 23  GLUCOSE 96 231*  --   --  189*  --   --  135* 126*  BUN 79* 74*  --   --  58*  --   --  34* 23  CREATININE 5.17* 5.21*  --   --  3.92*  --   --  2.44* 1.75*  CALCIUM 7.0* 6.7*  --   --  6.7*  --   --  7.0* 7.0*  MG  --  2.1  --  2.0  --   --   --   --  2.2  PHOS   --  6.4*  --   --  5.0*  --   --  3.2 2.5   < > = values in this interval not displayed.    Liver Function Tests: Recent Labs  Lab 11/03/2021 1101 11/10/2021 2045 11/08/21 0025 11/08/21 1656 11/09/21 0436  AST 15 17  --   --   --   ALT 18 18  --   --   --   ALKPHOS 190* 179*  --   --   --   BILITOT 1.0 1.2  --   --   --   PROT 6.2* 5.6*  --   --   --   ALBUMIN 3.1* 2.8* 2.8* 2.6* 2.4*   No results for input(s): "LIPASE", "AMYLASE" in the last 168 hours. No results for input(s): "AMMONIA" in the last 168 hours.  CBC: Recent Labs  Lab 10/21/2021 1101 11/03/2021 2045 10/28/2021 2110 11/08/21 0024 11/08/21 0500 11/08/21 1257 11/09/21 0436  WBC 9.3 11.0*  --  11.9*  --   --  9.9  HGB 13.5 13.4 14.6 13.9 14.6 13.3 11.6*  HCT 43.4 41.7 43.0 40.8 43.0 39.0 34.3*  MCV 100.5* 97.7  --  92.7  --   --  93.0  PLT 101* 91*  --  78*  --   --  59*    Cardiac Enzymes: Recent Labs  Lab 10/21/2021 2045  CKTOTAL 160    BNP: BNP (last 3 results) Recent Labs    11/10/2021 2045  BNP 1,235.5*    ProBNP (last 3 results) No results for input(s): "PROBNP" in the last 8760 hours.   CBG: Recent Labs  Lab 11/08/21 1958 11/08/21 2342 11/09/21 0438 11/09/21 0854 11/09/21 1131  GLUCAP 107* 131* 118* 120* 122*    Coagulation Studies: No results for input(s): "LABPROT", "INR" in the last 72 hours.   Imaging   No results found.   Medications:     Current Medications:  aspirin EC  81 mg Oral Daily   Chlorhexidine Gluconate Cloth  6 each Topical Daily   folic acid  1 mg Oral Daily   hydrocortisone sod succinate (SOLU-CORTEF) inj  100 mg Intravenous Q12H   insulin aspart  0-15 Units Subcutaneous Q4H   lidocaine-EPINEPHrine  10 mL Other Once   multivitamin with minerals  1 tablet Oral Daily   pantoprazole (PROTONIX) IV  40 mg Intravenous Q24H   sodium chloride flush  10-40 mL Intracatheter Q12H   thiamine  100 mg Oral Daily   Or   thiamine  100 mg Intravenous Daily     Infusions:   prismasol BGK 4/2.5 400 mL/hr at 11/09/21 1129    prismasol BGK 4/2.5 400 mL/hr at 11/09/21 1129   sodium chloride 250 mL (11/09/21 1243)   norepinephrine (LEVOPHED) Adult infusion Stopped (11/09/21 0803)   prismasol BGK 4/2.5 1,500 mL/hr at 11/09/21 1307      Patient Profile   71 y.o. male with history of DM II, HTN, HLD, obesity, prior longstanding tobacco use, CKD with one functioning kidney, hx type B aortic dissection.   Admitted with shock, AKI and metabolic acidosis.  Assessment/Plan   Shock: -Presented with shock and severe metabolic acidosis. Required high-dose NE and Vaso. Pressors now off. Surprisingly, lactic acid not elevated. -BC X 2 NGTD. Procalcitonin  negative. Now off abx -Euglycemic DKA a consideration with SGLT2i use -Evidence of CAD on CTA but no significant troponin elevation or ischemic ECG changes -Has significant RV dysfunction/RV failure. CO-OX 57% today with CI 2.1. DBA attempted but developed Afib, now back in SR. ? PE as trigger. Has poor mobility at baseline. Agree with V/Q scan, pending.  2. HFpEF with RV failure: -Echo this admit: EF 60-65%, RV severely dilated with severely reduced fxn, RVSP 58 mmHg, severe RAE, moderate LAE, dilated IVC -? Etiology untreated OSA/OHS vs PE -Volume overloaded on exam. CVP 17. Pull more volume, aim for negative 100/hr. Ideal CVP 10-11. Not sure if he would be a good candidate for long-term iHD if renal function does not recover. -Begin adding GDMT if BP tolerates volume removal -Plan for RHC in several days, likely early next week  3. Pulmonary HTN: -Suspect group III/?possible group IV given poor mobility -V/Q scan pending -Eventually will need sleep study, suspect may have OSA/OHS -PFTs as outpatient. Evidence of emphysema on CT and many years of tobacco abuse  4. Anion gap metabolic acidosis: -Now off bicarb gtt -Continue CRRT per Nephrology  5. AKI on CKD IIIb: -AKI felt to be secondary  to ischemic ATN in setting of shock and medications (ARB/SGLT2i) -Has one functioning kidney.  Note CT from 2004 suggests left renal artery involved in type B aortic dissection.  -Scr 1.7-1.8 over last year -Scr 5.2 on admit -Now on CRRT per Nephrology -Making some urine, watch for renal recovery  6. HTN -BP elevated via [place].  -Will start meds slowly if tolerates more volume removal with CRRT   7. DM II: -Last A1c 5.7 07/23 -Per primary team -Off SGLT2i with shock and metabolic acidosis  8. Hyperkalemia: -resolved  9. Thrombocytopenia: -Platelets 101>59K -Now off heparin -? D/t critical illness  10. Hx type B aortic dissection/thoracic and abdominal aortic aneurysms -Descending thoracic aortic aneurysm (4.6 cm) and infrarenal AAA (5.0 cm) noted on CT this admit -Eventually needs vascular eval   Very poor functional status at baseline. May end up needing SNF at discharge.   Length of Stay: 2  Harry James N, PA-C  11/09/2021, 2:21 PM  Advanced Heart Failure Team Pager [phone] (M-F; 7a - 5p)  Please contact [facility] for night-coverage after hours (4p -7a ) and weekends on [email or web address]

## 2021-11-09 NOTE — Progress Notes (Signed)
CSW received consult for substance use. CSW spoke with patient at bedside. CSW offered patient outpatient substance use treatment services resources. Patient accepted. All questions answered. No further questions reported at this time. 

## 2021-11-09 NOTE — Progress Notes (Signed)
ANTICOAGULATION CONSULT NOTE  Pharmacy Consult for Heparin Indication: possible VTE/PE  No Known Allergies  Patient Measurements: Height: '5\' 5"'$  (165.1 cm) Weight: 110.1 kg (242 lb 11.6 oz) IBW/kg (Calculated) : 61.5 Heparin Dosing Weight: 85 kg  Vital Signs: Temp: 98.1 F (36.7 C) (10/26 0645) Temp Source: Core (10/26 0400) BP: 108/62 (10/26 0600) Pulse Rate: 70 (10/26 0645)  Labs: Recent Labs    10/29/2021 1101 10/22/2021 1255 10/27/2021 2045 10/21/2021 2110 11/08/21 0024 11/08/21 0025 11/08/21 0500 11/08/21 1013 11/08/21 1257 11/08/21 1656 11/08/21 2058 11/09/21 0436 11/09/21 0555  HGB 13.5  --  13.4   < > 13.9  --  14.6  --  13.3  --   --  11.6*  --   HCT 43.4  --  41.7   < > 40.8  --  43.0  --  39.0  --   --  34.3*  --   PLT 101*  --  91*  --  78*  --   --   --   --   --   --  59*  --   HEPARINUNFRC  --   --   --   --   --   --   --  0.78*  --   --  0.78*  --  0.33  CREATININE 5.17*  --  5.21*  --   --  3.92*  --   --   --  2.44*  --  1.75*  --   CKTOTAL  --   --  160  --   --   --   --   --   --   --   --   --   --   TROPONINIHS 23* 27* 105*  --   --   --   --   --   --   --   --   --   --    < > = values in this interval not displayed.     Estimated Creatinine Clearance: 43.7 mL/min (A) (by C-G formula based on SCr of 1.75 mg/dL (H)).   Medical History: Past Medical History:  Diagnosis Date   Chronic kidney disease    has one functional kidney   Diabetes mellitus    prediabetes   Hyperlipidemia    Hypertension    Polycythemia, secondary 01/25/2014   Tobacco abuse    Ulcer    perforated duodenal ulcer 12/12    Medications:  No current facility-administered medications on file prior to encounter.   Current Outpatient Medications on File Prior to Encounter  Medication Sig Dispense Refill   albuterol (VENTOLIN HFA) 108 (90 Base) MCG/ACT inhaler Inhale 2 puffs into the lungs every 6 (six) hours as needed for wheezing or shortness of breath. 8.5 g 1    cloNIDine (CATAPRES) 0.3 MG tablet TAKE 1 TABLET(0.3 MG) BY MOUTH TWICE DAILY (Patient taking differently: Take 0.3 mg by mouth 2 (two) times daily.) 180 tablet 0   dapagliflozin propanediol (FARXIGA) 10 MG TABS tablet TAKE 1 TABLET(10 MG) BY MOUTH DAILY BEFORE BREAKFAST (Patient taking differently: Take 10 mg by mouth daily.) 30 tablet 3   Fish Oil OIL Take 1 tablet by mouth daily.     furosemide (LASIX) 80 MG tablet Take 1 tablet (80 mg total) by mouth daily. 90 tablet 3   metoprolol tartrate (LOPRESSOR) 100 MG tablet TAKE 1 TABLET BY MOUTH TWICE DAILY (Patient taking differently: Take 100 mg by mouth 2 (two) times daily.) 180 tablet  2   Multiple Vitamins-Minerals (MULTIVITAMINS THER. W/MINERALS) TABS Take 1 tablet by mouth daily. 30 each    omeprazole (PRILOSEC) 40 MG capsule TAKE 1 CAPSULE(40 MG) BY MOUTH DAILY (Patient taking differently: Take 40 mg by mouth daily.) 90 capsule 3   TRELEGY ELLIPTA 100-62.5-25 MCG/ACT AEPB INHALE 1 PUFF INTO THE LUNGS DAILY 60 each 3   valsartan (DIOVAN) 160 MG tablet TAKE 1 TABLET(160 MG) BY MOUTH DAILY (Patient taking differently: Take 160 mg by mouth daily.) 90 tablet 3   doxazosin (CARDURA) 2 MG tablet TAKE 1 TABLET(2 MG) BY MOUTH DAILY (Patient not taking: Reported on 11/01/2021) 90 tablet 3   simvastatin (ZOCOR) 40 MG tablet TAKE 1 TABLET BY MOUTH EVERY EVENING (Patient taking differently: Take 40 mg by mouth daily at 6 PM.) 90 tablet 1   traMADol (ULTRAM) 50 MG tablet TAKE 2 TABLETS(100 MG) BY MOUTH FOUR TIMES DAILY (Patient not taking: Reported on 11/05/2021) 60 tablet 0     Assessment: 71 y.o. male who presented with weakness and shortness of breath now with shock requiring vasopressors. Pharmacy consulted for heparin dosing due to possible PE/DVT. Doppler 10/25 showed no evidence of DVT. Per CCM will follow up a V/Q study when stable to evaluate for PE. No bleeding reported. Patient on CRRT.  Heparin level is therapeutic at 0.33 units/mL after decrease  to 1100 units/hr. Hgb down to 11.6 and pltc down to 59. Per discussion with CCM will stop heparin given plt decline and f/u V/Q study to determine whether further anticoagulation is needed.  Goal of Therapy:  Heparin level 0.3-0.7 units/ml Monitor platelets by anticoagulation protocol: Yes   Plan:  Stop heparin Will continue to monitor platelet decline Daily heparin level and CBC  Thank you for allowing pharmacy to participate in this patient's care.  Reatha Harps, PharmD PGY2 Pharmacy Resident 11/09/2021 10:04 AM Check [email or web address] for unit specific pharmacy number

## 2021-11-09 NOTE — Progress Notes (Signed)
eLink Physician-Brief Progress Note Patient Name: CRANDALL HARVEL DOB: [birth year] MRN: [id number]   Date of Service  11/09/2021  HPI/Events of Note  Patient awake all night and unable to sleep. Nursing request for sleep aid.   eICU Interventions  Plan: Melatonin 3 mg PO Q HS PRN sleep.      Intervention Category Major Interventions: Other:  Lennin Osmond Cornelia Copa 11/09/2021, 3:27 AM

## 2021-11-09 NOTE — Progress Notes (Signed)
[facility] Progress Note Patient Name: Harry James DOB: 1950-07-06 MRN: [id number]   Date of Service  11/09/2021  HPI/Events of Note  Patient requesting cigarettes. Nursing request for Nicotine patch and says patient smokes 0.5 PPD.   eICU Interventions  Plan: Nicotine patch 14 mg to skin now and Q day.      Intervention Category Major Interventions: Other:  Lysle Dingwall 11/09/2021, 8:56 PM

## 2021-11-09 NOTE — Progress Notes (Addendum)
NAME:  BARRET ESQUIVEL, MRN:  [id number], DOB:  [birth year], LOS: 2 ADMISSION DATE:  10/25/2021, CONSULTATION DATE:  11/08/2021 REFERRING MD:  EDP, CHIEF COMPLAINT:  Weakness, Fall     History of Present Illness:  Naomi Fitton is a 71 y.o. M with PMH of CKD stage 3b, solitary kidney, DM, HTN, HL, tobacco use who presented to the ED at [facility] after several days of decreased UOP and generalized weakness with increasing shortness of breath.  He had a fall without LOC on the day of admission and EMS was called.   On chart review it appears his PCP has been concerned about rising creatinine over the last several months and had him stop taking Lasix.   He denies significant chest pain or recent fevers, no abdominal pain, n/v/d.    He was initially hypotensive in the ED with negative CT abd/pelvis, C-spine and head. Labs showed Creatinine of 5.17, K 6.1, bicarb 7 and AG 16, pH 7.1, pCO2 25, trop 27, WBC 11k, leuks and bacteria on UA, lactic acid 1.6.  He was initially given 3L IVF and broad spectrum antibiotics then Lasix along with insulin and calcium.  He was transferred to [facility] ED where he arrived on Vaso and Levophed 84mg, on RA with tachypnea and still mentating.  PCCM consulted for admission  Pertinent  Medical History  CKD IIIb  Solitary Kidney DM Type II  HTN HL Significant Hospital Events: Including procedures, antibiotic start and stop dates in addition to other pertinent events   10/24 [facility] to [facility] ED, [facility] In shock and renal failure on levo and vaso.  Started on cefepime, flagyl, vancomycin. 10/25 - Remains on levo at 175m and Vaso at 0.3. Attempting to decrease pressor requirements.  10/26 - Decreasing pressor requirements. Vaso is off and Levo at 47m75m Echo shows severely enlarged RA and RV with RVSP of  50m59mand LVEF of 60-65%.  Interim History / Subjective:  Feeling better. Almost off pressors  Objective   Blood pressure 108/62, pulse 70, temperature 98.1 F  (36.7 C), resp. rate 18, height '5\' 5"'$  (1.651 m), weight 110.1 kg, SpO2 97 %. CVP:  [18 mmHg-36 mmHg] 32 mmHg      Intake/Output Summary (Last 24 hours) at 11/09/2021 0716 Last data filed at 11/09/2021 0700 Gross per 24 hour  Intake 2924.63 ml  Output 3111 ml  Net -186.37 ml   Filed Weights   10/16/2021 1151 11/08/21 0500 11/09/21 0500  Weight: 104.5 kg 110 kg 110.1 kg    Examination: General: resting in bed. No distress HENT: NCAT no JVD right eye ecchymosis improving  Lungs: some rhonchi when coughs. Dec bases  Cardiovascular: RRR Abdomen: soft  Extremities: chronic venous changes, marked ecchymosis around right fem HD site  Neuro: awake and oriented  GU: UOP about 15cc/hr  Resolved Hospital Problem list    Assessment & Plan:  Shock, unclear source, septic vs cardiogenic  Possible UTI AGMA  Suspect cardiogenic from decomp RV dysfxn and progressive cardiorenal syndrome, PCT neg. Also consider PE or just severe secondary PH Plan Cont to wean NE for MAP > 65 Stop abx Tele  Will get VQ scan. Doubt PE but possible  Day 2 hydrocort. Stop tomorrow if off gtts  Acute on chronic CKD 3b renal failure Hyperkalemia, hypocalcemia Appears did not follow up with PCP for worsening renal function -appreciate nephrology recs Plan Cont CRRT Renal adjust meds Strict I&O  Obesity ? OSA  Plan PCXR Noctunal pulse  ox    Elevated troponin Suspect demand ischemia ? PE 27>105 Plan Tele  Holding heparin for dropping plts   Progressive thrombocytopenia Plan Stop heparin for now  Type 2 DM Excellent control Plan Ssi    Chronic thoracic and abdominal aortic dissection Aneurysmal infrarenal abdominal aorta, increased size from prior Plan -will need vascular eval once through this acute illness  Best Practice (right click and "Reselect all SmartList Selections" daily)   Diet/type: Regular consistency (see orders) DVT prophylaxis: systemic heparin GI prophylaxis:  N/A Lines: Central line and yes and it is still needed Foley:  Yes, and it is still needed Code Status:  full code  Critical care time: 32 min    Erick Colace ACNP-BC [place] Pager # [phone] OR # [phone] if no answer

## 2021-11-09 NOTE — Progress Notes (Signed)
ANTICOAGULATION CONSULT NOTE  Pharmacy Consult for Heparin >> argatroban Indication: possible VTE/PE  No Known Allergies  Patient Measurements: Height: '5\' 5"'$  (165.1 cm) Weight: 110.1 kg (242 lb 11.6 oz) IBW/kg (Calculated) : 61.5 Heparin Dosing Weight: 85 kg  Vital Signs: Temp: 97.5 F (36.4 C) (10/26 1500) Temp Source: Bladder (10/26 0800) BP: 120/77 (10/26 1500) Pulse Rate: 72 (10/26 1500)  Labs: Recent Labs    11/04/2021 1101 10/26/2021 1255 10/27/2021 2045 10/15/2021 2110 11/08/21 0024 11/08/21 0025 11/08/21 0500 11/08/21 1013 11/08/21 1257 11/08/21 1656 11/08/21 2058 11/09/21 0436 11/09/21 0555  HGB 13.5  --  13.4   < > 13.9  --  14.6  --  13.3  --   --  11.6*  --   HCT 43.4  --  41.7   < > 40.8  --  43.0  --  39.0  --   --  34.3*  --   PLT 101*  --  91*  --  78*  --   --   --   --   --   --  59*  --   HEPARINUNFRC  --   --   --   --   --   --   --  0.78*  --   --  0.78*  --  0.33  CREATININE 5.17*  --  5.21*  --   --  3.92*  --   --   --  2.44*  --  1.75*  --   CKTOTAL  --   --  160  --   --   --   --   --   --   --   --   --   --   TROPONINIHS 23* 27* 105*  --   --   --   --   --   --   --   --   --   --    < > = values in this interval not displayed.     Estimated Creatinine Clearance: 43.7 mL/min (A) (by C-G formula based on SCr of 1.75 mg/dL (H)).   Medical History: Past Medical History:  Diagnosis Date   Chronic kidney disease    has one functional kidney   Diabetes mellitus    prediabetes   Hyperlipidemia    Hypertension    Polycythemia, secondary 01/25/2014   Tobacco abuse    Ulcer    perforated duodenal ulcer 12/12    Medications:  No current facility-administered medications on file prior to encounter.   Current Outpatient Medications on File Prior to Encounter  Medication Sig Dispense Refill   albuterol (VENTOLIN HFA) 108 (90 Base) MCG/ACT inhaler Inhale 2 puffs into the lungs every 6 (six) hours as needed for wheezing or shortness of  breath. 8.5 g 1   cloNIDine (CATAPRES) 0.3 MG tablet TAKE 1 TABLET(0.3 MG) BY MOUTH TWICE DAILY (Patient taking differently: Take 0.3 mg by mouth 2 (two) times daily.) 180 tablet 0   dapagliflozin propanediol (FARXIGA) 10 MG TABS tablet TAKE 1 TABLET(10 MG) BY MOUTH DAILY BEFORE BREAKFAST (Patient taking differently: Take 10 mg by mouth daily.) 30 tablet 3   Fish Oil OIL Take 1 tablet by mouth daily.     furosemide (LASIX) 80 MG tablet Take 1 tablet (80 mg total) by mouth daily. 90 tablet 3   metoprolol tartrate (LOPRESSOR) 100 MG tablet TAKE 1 TABLET BY MOUTH TWICE DAILY (Patient taking differently: Take 100 mg by mouth 2 (two) times daily.)  180 tablet 2   Multiple Vitamins-Minerals (MULTIVITAMINS THER. W/MINERALS) TABS Take 1 tablet by mouth daily. 30 each    omeprazole (PRILOSEC) 40 MG capsule TAKE 1 CAPSULE(40 MG) BY MOUTH DAILY (Patient taking differently: Take 40 mg by mouth daily.) 90 capsule 3   TRELEGY ELLIPTA 100-62.5-25 MCG/ACT AEPB INHALE 1 PUFF INTO THE LUNGS DAILY 60 each 3   valsartan (DIOVAN) 160 MG tablet TAKE 1 TABLET(160 MG) BY MOUTH DAILY (Patient taking differently: Take 160 mg by mouth daily.) 90 tablet 3   doxazosin (CARDURA) 2 MG tablet TAKE 1 TABLET(2 MG) BY MOUTH DAILY (Patient not taking: Reported on 11/12/2021) 90 tablet 3   simvastatin (ZOCOR) 40 MG tablet TAKE 1 TABLET BY MOUTH EVERY EVENING (Patient taking differently: Take 40 mg by mouth daily at 6 PM.) 90 tablet 1   traMADol (ULTRAM) 50 MG tablet TAKE 2 TABLETS(100 MG) BY MOUTH FOUR TIMES DAILY (Patient not taking: Reported on 10/31/2021) 60 tablet 0     Assessment: 71 y.o. male who presented with weakness and shortness of breath, then shock requiring vasopressors (now off) and AKI continuing on CRRT. Pharmacy consulted for heparin initially due to possible PE/DVT but held 10/26 AM due to platelets dropping. Doppler 10/25 showed no evidence of DVT; however, V/Q scan completed 10/26 shows PE present. Pharmacy consulted  to start argatroban. Hg down to 11.6, plt down to 59 (101 on admission), LFTs WNL. No bleed issues reported.  Goal of Therapy:  aPTT 50-90 seconds Monitor platelets by anticoagulation protocol: Yes   Plan:  Start argatroban 0.5 mcg/kg/min per ICU dosing protocol Check 4hr aPTT Monitor daily CBC, s/sx bleeding   Arturo Morton, PharmD, BCPS Please check AMION for all [place] contact numbers Clinical Pharmacist 11/09/2021 6:06 PM

## 2021-11-09 NOTE — Progress Notes (Signed)
[facility] Progress Note   Subjective:   improving - pressors off now - I/Os 2.9 / 3.1.  UOP 0.67 yesterday.  TTE with RA/RV issues, normal LVEF.  Pt c/o insomnia overnight but no other complaints.  No issues with CRRT per RN.    Objective Vitals:   11/09/21 0800 11/09/21 0900 11/09/21 0915 11/09/21 1000  BP: 90/74 (!) 101/54  (!) 109/56  Pulse: 69 72 70 70  Resp: '18 18 14 18  '$ Temp: 97.7 F (36.5 C) (!) 97.5 F (36.4 C) (!) 97.5 F (36.4 C) (!) 97.3 F (36.3 C)  TempSrc: Bladder     SpO2: 100% 97% 97% 91%  Weight:      Height:       Physical Exam General: obese man who is awake and alert in bed Heart: RRR no rub Lungs: coarse BL Abdomen: obese, soft Extremities: no edema Dialysis Access: R femoral temp cath  Additional Objective Labs: Basic Metabolic Panel: Recent Labs  Lab 11/08/21 0025 11/08/21 0500 11/08/21 1257 11/08/21 1656 11/09/21 0436  NA 134*   < > 135 136 138  K 4.4   < > 3.8 4.0 4.1  CL 104  --   --  104 104  CO2 14*  --   --  20* 23  GLUCOSE 189*  --   --  135* 126*  BUN 58*  --   --  34* 23  CREATININE 3.92*  --   --  2.44* 1.75*  CALCIUM 6.7*  --   --  7.0* 7.0*  PHOS 5.0*  --   --  3.2 2.5   < > = values in this interval not displayed.    Liver Function Tests: Recent Labs  Lab 11/08/2021 1101 10/23/2021 2045 11/08/21 0025 11/08/21 1656 11/09/21 0436  AST 15 17  --   --   --   ALT 18 18  --   --   --   ALKPHOS 190* 179*  --   --   --   BILITOT 1.0 1.2  --   --   --   PROT 6.2* 5.6*  --   --   --   ALBUMIN 3.1* 2.8* 2.8* 2.6* 2.4*    No results for input(s): "LIPASE", "AMYLASE" in the last 168 hours. CBC: Recent Labs  Lab 11/04/2021 1101 11/12/2021 2045 10/29/2021 2110 11/08/21 0024 11/08/21 0500 11/08/21 1257 11/09/21 0436  WBC 9.3 11.0*  --  11.9*  --   --  9.9  HGB 13.5 13.4   < > 13.9 14.6 13.3 11.6*  HCT 43.4 41.7   < > 40.8 43.0 39.0 34.3*  MCV 100.5* 97.7  --  92.7  --   --  93.0  PLT 101* 91*  --  78*  --    --  59*   < > = values in this interval not displayed.    Blood Culture    Component Value Date/Time   SDES  11/10/2021 1408    URINE, CATHETERIZED Performed at [facility], [address], [place], [place] [address]    [facility]  11/02/2021 1408    NONE Performed at [facility], [address], [place], [place] [address]    CULT (A) 11/01/2021 1408    80,000 COLONIES/mL STAPHYLOCOCCUS EPIDERMIDIS SUSCEPTIBILITIES TO FOLLOW Performed at [facility] [address], [place], [place] [address]    REPTSTATUS PENDING 11/12/2021 1408    Cardiac Enzymes: Recent Labs  Lab 11/12/2021 2045  CKTOTAL  160    CBG: Recent Labs  Lab 11/08/21 1538 11/08/21 1958 11/08/21 2342 11/09/21 0438 11/09/21 0854  GLUCAP 132* 107* 131* 118* 120*    [place] Studies: No results for input(s): "IRON", "TIBC", "TRANSFERRIN", "FERRITIN" in the last 72 hours. '@lablastinr3'$ @ Studies/Results: VAS [place] LOWER EXTREMITY VENOUS (DVT)  Result Date: 11/08/2021  Lower Venous DVT Study Patient Name:  Harry James  Date of Exam:   11/08/2021 Medical Rec #: [id number]        Accession #:    [id number] Date of Birth: 02/15/1950        Patient Gender: M Patient Age:   71 years Exam Location:  [facility] Procedure:      VAS [place] LOWER EXTREMITY VENOUS (DVT) Referring Phys: Kipp Brood --------------------------------------------------------------------------------  Indications: Edema.  Limitations: Body habitus, poor ultrasound/tissue interface and rt groin bandaging. Comparison Study: no prior Performing Technologist: Archie Patten RVS  Examination Guidelines: A complete evaluation includes B-mode imaging, spectral Doppler, color Doppler, and power Doppler as needed of all accessible portions of each vessel. Bilateral testing is considered an integral part of a complete examination. Limited examinations for reoccurring indications may be performed as noted. The reflux portion of the exam is performed  with the patient in reverse Trendelenburg.  +---------+---------------+---------+-----------+----------+-------------------+ RIGHT    CompressibilityPhasicitySpontaneityPropertiesThrombus Aging      +---------+---------------+---------+-----------+----------+-------------------+ CFV                                                   Not well visualized +---------+---------------+---------+-----------+----------+-------------------+ SFJ                                                   Not well visualized +---------+---------------+---------+-----------+----------+-------------------+ FV Prox                                               Not well visualized +---------+---------------+---------+-----------+----------+-------------------+ FV Mid                  Yes      Yes                                      +---------+---------------+---------+-----------+----------+-------------------+ FV Distal               Yes      Yes                                      +---------+---------------+---------+-----------+----------+-------------------+ PFV                                                   Not well visualized +---------+---------------+---------+-----------+----------+-------------------+ POP      Full                                                             +---------+---------------+---------+-----------+----------+-------------------+  PTV      Full                                                             +---------+---------------+---------+-----------+----------+-------------------+ PERO     Full           Yes      Yes                                      +---------+---------------+---------+-----------+----------+-------------------+   +---------+---------------+---------+-----------+----------+--------------+ LEFT     CompressibilityPhasicitySpontaneityPropertiesThrombus Aging  +---------+---------------+---------+-----------+----------+--------------+ CFV      Full           Yes      Yes                                 +---------+---------------+---------+-----------+----------+--------------+ SFJ      Full                                                        +---------+---------------+---------+-----------+----------+--------------+ FV Prox  Full                                                        +---------+---------------+---------+-----------+----------+--------------+ FV Mid                  Yes      Yes                                 +---------+---------------+---------+-----------+----------+--------------+ FV Distal               Yes      Yes                                 +---------+---------------+---------+-----------+----------+--------------+ PFV      Full                                                        +---------+---------------+---------+-----------+----------+--------------+ POP      Full           Yes      Yes                                 +---------+---------------+---------+-----------+----------+--------------+ PTV      Full                                                        +---------+---------------+---------+-----------+----------+--------------+  PERO     Full           Yes      Yes                                 +---------+---------------+---------+-----------+----------+--------------+     Summary: RIGHT: - There is no evidence of deep vein thrombosis in the lower extremity. However, portions of this examination were limited- see technologist comments above.  - No cystic structure found in the popliteal fossa.  LEFT: - There is no evidence of deep vein thrombosis in the lower extremity. However, portions of this examination were limited- see technologist comments above.  - No cystic structure found in the popliteal fossa.  *See table(s) above for measurements and observations.  Electronically signed by Harold Barban MD on 11/08/2021 at 10:37:04 PM.    Final    ECHOCARDIOGRAM COMPLETE  Result Date: 11/08/2021    ECHOCARDIOGRAM REPORT   Patient Name:   Harry James Date of Exam: 11/08/2021 Medical Rec #:  [id number]       Height:       65.0 in Accession #:    [id number]      Weight:       242.5 lb Date of Birth:  August 11, 1950       BSA:          2.147 m Patient Age:    29 years        BP:           135/63 mmHg Patient Gender: M               HR:           66 bpm. Exam Location:  Inpatient Procedure: 2D Echo, Intracardiac Opacification Agent, Cardiac Doppler and Color            Doppler Indications:    shock  History:        Patient has no prior history of Echocardiogram examinations.                 Chronic kidney disease and COPD; Risk Factors:Hypertension.  Sonographer:    Johny Chess RDCS Referring Phys: [id number] [place]  1. Left ventricular ejection fraction, by estimation, is 60 to 65%. The left ventricle has normal function. The left ventricle has no regional wall motion abnormalities. Left ventricular diastolic function could not be evaluated. There is the interventricular septum is flattened in systole, consistent with right ventricular pressure overload.  2. Right ventricular systolic function is severely reduced. The right ventricular size is severely enlarged. There is moderately elevated pulmonary artery systolic pressure. The estimated right ventricular systolic pressure is 54.0 mmHg.  3. Left atrial size was moderately dilated.  4. Right atrial size was severely dilated.  5. The mitral valve is normal in structure. No evidence of mitral valve regurgitation.  6. The aortic valve is tricuspid. Aortic valve regurgitation is not visualized. No aortic stenosis is present.  7. The inferior vena cava is dilated in size with <50% respiratory variability, suggesting right atrial pressure of 15 mmHg. Conclusion(s)/Recommendation(s): Findings consistent with  Cor Pulmonale. Overall impression is of chronic cor pulmonale, but cannot exclude a superimposed acute event such as pulmonary embolism. FINDINGS  Left Ventricle: Left ventricular ejection fraction, by estimation, is 60 to 65%. The left ventricle has normal function. The left ventricle has no regional wall motion abnormalities. Definity contrast agent was  given IV to delineate the left ventricular  endocardial borders. The left ventricular internal cavity size was normal in size. There is no left ventricular hypertrophy. The interventricular septum is flattened in systole, consistent with right ventricular pressure overload. Left ventricular diastolic function could not be evaluated due to atrial fibrillation. Left ventricular diastolic function could not be evaluated. Right Ventricle: The right ventricular size is severely enlarged. No increase in right ventricular wall thickness. Right ventricular systolic function is severely reduced. There is moderately elevated pulmonary artery systolic pressure. The tricuspid regurgitant velocity is 3.28 m/s, and with an assumed right atrial pressure of 15 mmHg, the estimated right ventricular systolic pressure is 93.7 mmHg. Left Atrium: Left atrial size was moderately dilated. Right Atrium: Right atrial size was severely dilated. Pericardium: There is no evidence of pericardial effusion. Mitral Valve: The mitral valve is normal in structure. No evidence of mitral valve regurgitation. Tricuspid Valve: The tricuspid valve is normal in structure. Tricuspid valve regurgitation is mild. Aortic Valve: The aortic valve is tricuspid. Aortic valve regurgitation is not visualized. No aortic stenosis is present. Pulmonic Valve: The pulmonic valve was normal in structure. Pulmonic valve regurgitation is not visualized. Aorta: The aortic root and ascending aorta are structurally normal, with no evidence of dilitation. Venous: The inferior vena cava is dilated in size with less than 50%  respiratory variability, suggesting right atrial pressure of 15 mmHg. IAS/Shunts: No atrial level shunt detected by color flow Doppler.  LEFT VENTRICLE PLAX 2D LVIDd:         4.30 cm   Diastology LVIDs:         3.50 cm   LV e' medial:    5.55 cm/s LV PW:         1.18 cm   LV E/e' medial:  21.1 LV IVS:        1.10 cm   LV e' lateral:   8.38 cm/s LVOT diam:     2.00 cm   LV E/e' lateral: 14.0 LV SV:         46 LV SV Index:   21 LVOT Area:     3.14 cm  RIGHT VENTRICLE            IVC RV S prime:     8.70 cm/s  IVC diam: 2.80 cm TAPSE (M-mode): 1.3 cm LEFT ATRIUM             Index        RIGHT ATRIUM           Index LA diam:        4.70 cm 2.19 cm/m   RA Area:     21.50 cm LA Vol (A2C):   54.1 ml 25.20 ml/m  RA Volume:   62.10 ml  28.92 ml/m LA Vol (A4C):   61.9 ml 28.83 ml/m LA Biplane Vol: 60.3 ml 28.09 ml/m  AORTIC VALVE LVOT Vmax:   83.30 cm/s LVOT Vmean:  49.800 cm/s LVOT VTI:    0.146 m  AORTA Ao Root diam: 3.20 cm Ao Asc diam:  3.20 cm MITRAL VALVE                TRICUSPID VALVE MV Area (PHT): 5.34 cm     TR Peak grad:   43.0 mmHg MV Decel Time: 142 msec     TR Vmax:        328.00 cm/s MV E velocity: 117.00 cm/s MV A velocity: 61.00 cm/s   SHUNTS MV E/A ratio:  1.92         Systemic VTI:  0.15 m                             Systemic Diam: 2.00 cm Sanda Klein MD Electronically signed by Sanda Klein MD Signature Date/Time: 11/08/2021/12:17:07 PM    Final    DG CHEST PORT 1 VIEW  Result Date: 10/27/2021 CLINICAL DATA:  Shortness of breath. EXAM: PORTABLE CHEST 1 VIEW COMPARISON:  November 07, 2021 (3:47 p.m.) FINDINGS: There is stable right-sided venous catheter positioning. The cardiac silhouette is mildly enlarged and unchanged in size. There is moderate to marked severity calcification and tortuosity of the descending thoracic aorta with a stable area of distal aneurysmal dilatation. Mild, stable bibasilar atelectasis is seen. There is no evidence of focal consolidation, pleural effusion or  pneumothorax. The visualized skeletal structures are unremarkable. IMPRESSION: 1. Mild, stable bibasilar atelectasis. Electronically Signed   By: Virgina Norfolk M.D.   On: 10/27/2021 21:03   CT CHEST ABDOMEN PELVIS WO CONTRAST  Result Date: 10/25/2021 CLINICAL DATA:  hypotension SOB, poor renal function EXAM: CT CHEST, ABDOMEN AND PELVIS WITHOUT CONTRAST TECHNIQUE: Multidetector CT imaging of the chest, abdomen and pelvis was performed following the standard protocol without IV contrast. RADIATION DOSE REDUCTION: This exam was performed according to the departmental dose-optimization program which includes automated exposure control, adjustment of the mA and/or kV according to patient size and/or use of iterative reconstruction technique. COMPARISON:  CT abdomen pelvis 12/15/2010, CT chest 12/08/2002 report without imaging. FINDINGS: CT CHEST FINDINGS Cardiovascular: Question enlarged right atrium. No significant pericardial effusion. Markedly limited evaluation of a descending thoracic aorta dissection and aneurysm: Measuring up to 4.6 cm. Severe atherosclerotic plaque of the thoracic aorta. Two vessel coronary artery calcifications. Mediastinum/Nodes: No gross hilar adenopathy, noting limited sensitivity for the detection of hilar adenopathy on this noncontrast study. No enlarged mediastinal or axillary lymph nodes. Thyroid gland, trachea, and esophagus demonstrate no significant findings. Lungs/Pleura: Centrilobular emphysematous changes. Limited evaluation due to respiratory motion artifact. No focal consolidation. No pulmonary nodule. No pulmonary mass. No pleural effusion. No pneumothorax. Musculoskeletal: No chest wall abnormality. No suspicious lytic or blastic osseous lesions. No acute displaced fracture with markedly limited evaluation of the sternum and ribs due to motion artifact. Multilevel degenerative changes of the spine. CT ABDOMEN PELVIS FINDINGS Hepatobiliary: No focal liver abnormality.  No gallstones, gallbladder wall thickening, or pericholecystic fluid. No biliary dilatation. Pancreas: No focal lesion. Normal pancreatic contour. No surrounding inflammatory changes. No main pancreatic ductal dilatation. Spleen: Normal in size without focal abnormality. Adrenals/Urinary Tract: No adrenal nodule bilaterally. Nonspecific bilateral perinephric stranding. Bilateral renal cortical scarring. Couple punctate calcifications associated with the kidneys may be vascular.No definite nephrolithiasis and no hydronephrosis. No definite contour-deforming renal mass. No ureterolithiasis or hydroureter. The urinary bladder is decompressed with Foley catheter tip and balloon within the lumen. Stomach/Bowel: Stomach is within normal limits. No evidence of bowel wall thickening or dilatation. Appendix appears normal. Vascular/Lymphatic: Interval increase in size of an infrarenal abdominal aorta aneurysm measuring 5 x 4.1 cm (from 3.9 x 3.2 cm). Aneurysmal bilateral common iliac arteries measuring 0.7 cm on the left and 2.6 cm on the right. Severe atherosclerotic plaque of the aorta and its branches. No abdominal, pelvic, or inguinal lymphadenopathy. Reproductive: Prostate is unremarkable. Other: No intraperitoneal free fluid. No intraperitoneal free gas. No organized fluid collection. Musculoskeletal: Small fat containing umbilical hernia. Diffuse mild subcutaneus soft tissue edema.  No suspicious lytic or blastic osseous lesions. No acute displaced fracture. Multilevel degenerative changes of the spine. Severe degenerative changes of the left hip. IMPRESSION: 1. Markedly limited evaluation on this noncontrast study with respiratory motion artifact. 2. Markedly limited evaluation of a known chronic thoracic and abdominal aortic dissection. Aneurysmal descending thoracic aorta (4.6 cm) as well as interval increase in size of an aneurysmal infrarenal abdominal aorta (5 cm). Recommend referral to a vascular specialist.  This recommendation follows ACR consensus guidelines: White Paper of the ACR Incidental Findings Committee II on Vascular Findings. J Am Coll Radiol 2013; 10:789-794. 3. Aneurysmal bilateral common iliac arteries measuring 0.7 cm on the left and 2.6 cm on the right. 4. Aortic Atherosclerosis (ICD10-I70.0) and Emphysema (ICD10-J43.9). 5. Enlarged right atrium. 6. No acute nonvascular intrathoracic, intra-abdominal, intrapelvic abnormality. Electronically Signed   By: Iven Finn M.D.   On: 11/10/2021 17:53   CT Cervical Spine Wo Contrast  Result Date: 11/03/2021 CLINICAL DATA:  Weakness, confusion, trauma EXAM: CT CERVICAL SPINE WITHOUT CONTRAST TECHNIQUE: Multidetector CT imaging of the cervical spine was performed without intravenous contrast. Multiplanar CT image reconstructions were also generated. RADIATION DOSE REDUCTION: This exam was performed according to the departmental dose-optimization program which includes automated exposure control, adjustment of the mA and/or kV according to patient size and/or use of iterative reconstruction technique. COMPARISON:  None Available. FINDINGS: Evaluation of the cervical spine is severely limited by patient motion throughout the study. Despite repeat imaging, evaluation of the C4 through C7 levels is severely limited and nondiagnostic. C1 through C3 vertebral bodies are in normal alignment, with no evidence of acute fracture. Soft tissues of the skull base and within the upper cervical spine are unremarkable. Evaluation of the central canal is limited by motion. Visualized portions of the lung apices are clear. IMPRESSION: 1. Extremely limited evaluation due to patient motion throughout the exam. This is a nondiagnostic evaluation of the C4 through C7 vertebral bodies. 2. Unremarkable appearance of the C1 through C3 vertebral bodies. If cervical spine fracture remains a clinical concern, repeat CT cervical spine may be useful when the patient is able to fully  cooperate with the study. Electronically Signed   By: Randa Ngo M.D.   On: 11/09/2021 17:37   CT Head Wo Contrast  Result Date: 10/27/2021 CLINICAL DATA:  Head trauma, loss of consciousness, weakness, confusion EXAM: CT HEAD WITHOUT CONTRAST TECHNIQUE: Contiguous axial images were obtained from the base of the skull through the vertex without intravenous contrast. RADIATION DOSE REDUCTION: This exam was performed according to the departmental dose-optimization program which includes automated exposure control, adjustment of the mA and/or kV according to patient size and/or use of iterative reconstruction technique. COMPARISON:  None Available. FINDINGS: Evaluation is limited by patient motion throughout the exam. Brain: No acute infarct or hemorrhage. Lateral ventricles and midline structures are grossly unremarkable. No acute extra-axial fluid collections. No mass effect. Vascular: No hyperdense vessel or unexpected calcification. Skull: Normal. Negative for fracture or focal lesion. Sinuses/Orbits: No acute finding. Other: None. IMPRESSION: 1. Limited study due to patient motion throughout the exam. 2. No acute intracranial process. Electronically Signed   By: Randa Ngo M.D.   On: 10/24/2021 17:35   DG Chest 1V REPEAT Same Day  Result Date: 10/17/2021 CLINICAL DATA:  71 year old male with central line placement EXAM: CHEST - 1 VIEW SAME DAY COMPARISON:  10/27/2021 FINDINGS: Cardiomediastinal silhouette unchanged in size and contour. No evidence of central vascular congestion. No interlobular septal thickening. Interval placement  of right IJ central venous catheter with the tip terminating superior vena cava. No pneumothorax or pleural effusion. Coarsened interstitial markings, with no confluent airspace disease. No acute displaced fracture. IMPRESSION: Interval placement of right IJ central venous catheter, with no complicating features Electronically Signed   By: Corrie Mckusick D.O.   On:  10/29/2021 15:57   DG Chest 1 View  Result Date: 11/01/2021 CLINICAL DATA:  Chest pain EXAM: CHEST  1 VIEW COMPARISON:  Chest 12/22/2010 FINDINGS: Cardiac enlargement. Negative for heart failure or edema. Atherosclerotic calcification thoracic aorta. Lungs clear without infiltrate or effusion. IMPRESSION: No active disease. Electronically Signed   By: Franchot Gallo M.D.   On: 10/28/2021 11:43   Medications:   prismasol BGK 4/2.5 400 mL/hr at 11/08/21 2304    prismasol BGK 4/2.5 400 mL/hr at 11/08/21 2304   sodium chloride Stopped (11/02/2021 2114)   norepinephrine (LEVOPHED) Adult infusion Stopped (11/09/21 0803)   prismasol BGK 4/2.5 1,500 mL/hr at 11/09/21 4403    aspirin EC  81 mg Oral Daily   Chlorhexidine Gluconate Cloth  6 each Topical Daily   folic acid  1 mg Oral Daily   hydrocortisone sod succinate (SOLU-CORTEF) inj  100 mg Intravenous Q12H   insulin aspart  0-15 Units Subcutaneous Q4H   lidocaine-EPINEPHrine  10 mL Other Once   multivitamin with minerals  1 tablet Oral Daily   pantoprazole (PROTONIX) IV  40 mg Intravenous Q24H   sodium chloride flush  10-40 mL Intracatheter Q12H   thiamine  100 mg Oral Daily   Or   thiamine  100 mg Intravenous Daily    Assessment/Recommendations:    AKI on CKD 3 (bl Cr~1.7-1.8) -AKI secondary to ischemic ATN in the setting of shock with concomitant ARB/SGLT2i use -given severe acidemia & hyperK, start CRRT 10/24PM. Appreciate CCM's assistance with temp line placement -Cont 4K/2.5Ca; run net even today -Hold CRRT if clots/off for other reason - he's more hemodynamically stable and could do iHD if needed -Making some urine - watch for signs of renal recovery -Avoid nephrotoxic medications including NSAIDs and iodinated intravenous contrast exposure unless the latter is absolutely indicated.  Preferred narcotic agents for pain control are hydromorphone, fentanyl, and methadone. Morphine should not be used. Avoid Baclofen and avoid oral sodium  phosphate and magnesium citrate based laxatives / bowel preps. Continue strict Input and Output monitoring. Will monitor the patient closely with you and intervene or adjust therapy as indicated by changes in clinical status/labs    Shock: off pressors now.  Thought to be more cardiogenic etiology - RV issues.  V/Q pending.  Hydrocortisone being weaned off too   Anion Gap Metabolic acidosis -likely stemming from AKI but there is a possibility of euglycemic DKA in the context of Farxiga, CRRT as above. lactate WNL -off bicarb gtt now, cont CRRT for the time being   Possible sepsis -UA abnormal, Ucx staph epi. Blood cx neg.  Off abx now - though to be cardiogenic shock  Thrombocytopenia - heparin off per primary - will avoid with dialysis  Jannifer Hick MD 11/09/2021, 10:41 AM  [facility] Pager: [phone]

## 2021-11-09 NOTE — Progress Notes (Signed)
SCVO2 in 75s. Attempted low dose inotrope. On only 2.22mg/kg/min of dobutamine developed af w/ RVR. This aborted w/out any other intervention beyond stopping dobutamine  Plan Will ask HF team to assess We are concerned that his disease process is from decomp right sided HF -->then cardiorenal failure. He is more hemodynamically stable now and I suspect we will be able to get volume off. W/ scvo2 was hoping augmenting CO might help advance things along  PErick ColaceACNP-BC LTowandaPager # [phone] # [phone] no answer

## 2021-11-09 NOTE — Progress Notes (Signed)
VQ c/w PE Will do argatroban given plt issues after heparin; appreciate pharmacy help with dosing.

## 2021-11-10 ENCOUNTER — Other Ambulatory Visit (HOSPITAL_COMMUNITY): Payer: Self-pay

## 2021-11-10 ENCOUNTER — Inpatient Hospital Stay (HOSPITAL_COMMUNITY): Payer: Medicare Other

## 2021-11-10 ENCOUNTER — Telehealth (HOSPITAL_COMMUNITY): Payer: Self-pay | Admitting: Pharmacy Technician

## 2021-11-10 DIAGNOSIS — Z515 Encounter for palliative care: Secondary | ICD-10-CM | POA: Diagnosis not present

## 2021-11-10 DIAGNOSIS — Z7189 Other specified counseling: Secondary | ICD-10-CM

## 2021-11-10 DIAGNOSIS — Z66 Do not resuscitate: Secondary | ICD-10-CM | POA: Diagnosis not present

## 2021-11-10 DIAGNOSIS — I2609 Other pulmonary embolism with acute cor pulmonale: Secondary | ICD-10-CM | POA: Diagnosis not present

## 2021-11-10 DIAGNOSIS — R579 Shock, unspecified: Secondary | ICD-10-CM | POA: Diagnosis not present

## 2021-11-10 LAB — RENAL FUNCTION PANEL
Albumin: 2.7 g/dL — ABNORMAL LOW (ref 3.5–5.0)
Albumin: 2.9 g/dL — ABNORMAL LOW (ref 3.5–5.0)
Anion gap: 13 (ref 5–15)
Anion gap: 7 (ref 5–15)
BUN: 14 mg/dL (ref 8–23)
BUN: 12 mg/dL (ref 8–23)
CO2: 21 mmol/L — ABNORMAL LOW (ref 22–32)
CO2: 25 mmol/L (ref 22–32)
Calcium: 7.3 mg/dL — ABNORMAL LOW (ref 8.9–10.3)
Calcium: 7.4 mg/dL — ABNORMAL LOW (ref 8.9–10.3)
Chloride: 103 mmol/L (ref 98–111)
Chloride: 106 mmol/L (ref 98–111)
Creatinine, Ser: 1.2 mg/dL (ref 0.61–1.24)
Creatinine, Ser: 1.1 mg/dL (ref 0.61–1.24)
GFR, Estimated: >60 mL/min (ref >60)
GFR, Estimated: >60 mL/min (ref >60)
Glucose, Bld: 128 mg/dL — ABNORMAL HIGH (ref 70–99)
Glucose, Bld: 150 mg/dL — ABNORMAL HIGH (ref 70–99)
Phosphorus: 2 mg/dL — ABNORMAL LOW (ref 2.5–4.6)
Phosphorus: 1.5 mg/dL — ABNORMAL LOW (ref 2.5–4.6)
Potassium: 4 mmol/L (ref 3.5–5.1)
Potassium: 4 mmol/L (ref 3.5–5.1)
Sodium: 137 mmol/L (ref 135–145)
Sodium: 138 mmol/L (ref 135–145)

## 2021-11-10 LAB — GLUCOSE, CAPILLARY
Glucose-Capillary: 118 mg/dL — ABNORMAL HIGH (ref 70–99)
Glucose-Capillary: 122 mg/dL — ABNORMAL HIGH (ref 70–99)
Glucose-Capillary: 126 mg/dL — ABNORMAL HIGH (ref 70–99)
Glucose-Capillary: 142 mg/dL — ABNORMAL HIGH (ref 70–99)
Glucose-Capillary: 155 mg/dL — ABNORMAL HIGH (ref 70–99)
Glucose-Capillary: 167 mg/dL — ABNORMAL HIGH (ref 70–99)
Glucose-Capillary: 180 mg/dL — ABNORMAL HIGH (ref 70–99)

## 2021-11-10 LAB — CBC
HCT: 31.4 % — ABNORMAL LOW (ref 39.0–52.0)
Hemoglobin: 10.6 g/dL — ABNORMAL LOW (ref 13.0–17.0)
MCH: 31.7 pg (ref 26.0–34.0)
MCHC: 33.8 g/dL (ref 30.0–36.0)
MCV: 94 fL (ref 80.0–100.0)
Platelets: 56 10*3/uL — ABNORMAL LOW (ref 150–400)
RBC: 3.34 MIL/uL — ABNORMAL LOW (ref 4.22–5.81)
RDW: 14.8 % (ref 11.5–15.5)
WBC: 7.8 10*3/uL (ref 4.0–10.5)
nRBC: 0 % (ref 0.0–0.2)

## 2021-11-10 LAB — COOXEMETRY PANEL
Carboxyhemoglobin: 3.1 % — ABNORMAL HIGH (ref 0.5–1.5)
Methemoglobin: <0.7 % (ref 0.0–1.5)
O2 Saturation: 80.2 %
Total hemoglobin: 10.4 g/dL — ABNORMAL LOW (ref 12.0–16.0)

## 2021-11-10 LAB — URINE CULTURE: Culture: 80000 — AB

## 2021-11-10 LAB — APTT: aPTT: 56 seconds — ABNORMAL HIGH (ref 24–36)

## 2021-11-10 LAB — TSH: TSH: 1.634 u[IU]/mL (ref 0.350–4.500)

## 2021-11-10 LAB — MAGNESIUM: Magnesium: 2.4 mg/dL (ref 1.7–2.4)

## 2021-11-10 MED ORDER — HYDRALAZINE HCL 20 MG/ML IJ SOLN
10.0000 mg | INTRAMUSCULAR | Status: DC | PRN
  Administered 2021-11-10 (×3): 10 mg via INTRAVENOUS
  Filled 2021-11-10 (×3): qty 1

## 2021-11-10 MED ORDER — AMIODARONE LOAD VIA INFUSION
150.0000 mg | Freq: Once | INTRAVENOUS | Status: AC
  Administered 2021-11-10: 150 mg via INTRAVENOUS
  Filled 2021-11-10: qty 83.34

## 2021-11-10 MED ORDER — AMIODARONE HCL IN DEXTROSE 360-4.14 MG/200ML-% IV SOLN
60.0000 mg/h | INTRAVENOUS | Status: AC
  Administered 2021-11-10 (×2): 60 mg/h via INTRAVENOUS
  Filled 2021-11-10 (×2): qty 200

## 2021-11-10 MED ORDER — AMIODARONE HCL IN DEXTROSE 360-4.14 MG/200ML-% IV SOLN
30.0000 mg/h | INTRAVENOUS | Status: DC
  Administered 2021-11-10 – 2021-11-11 (×2): 30 mg/h via INTRAVENOUS
  Filled 2021-11-10 (×2): qty 200

## 2021-11-10 NOTE — TOC Benefit Eligibility Note (Signed)
Patient Teacher, English as a foreign language completed.    The patient is currently admitted and upon discharge could be taking Eliquis 5 mg.  The current 30 day co-pay is $149.97 due to being in Coverage Gap (donut hole).   The patient is currently admitted and upon discharge could be taking Entesto 24-26 mg.  The current 30 day co-pay is $178.60 due to being in Coverage Gap (donut hole).   The patient is currently admitted and upon discharge could be taking Jardiance 10 mg.  The current 30 day co-pay is $158.66 due to being in Coverage Gap (donut hole).   The patient is insured through [place], [place] Patient Advocate Specialist [place] Patient Advocate Team Direct Number: [phone]  Fax: [phone]

## 2021-11-10 NOTE — Progress Notes (Signed)
ANTICOAGULATION CONSULT NOTE - Follow Up Consult  Pharmacy Consult for argatroban Indication: pulmonary embolus  Labs: Recent Labs    10/26/2021 1101 10/22/2021 1255 11/03/2021 2045 10/15/2021 2110 11/08/21 0024 11/08/21 0025 11/08/21 0500 11/08/21 1013 11/08/21 1257 11/08/21 1656 11/08/21 2058 11/09/21 0436 11/09/21 0555 11/09/21 1808 11/09/21 2304  HGB 13.5  --  13.4   < > 13.9  --  14.6  --  13.3  --   --  11.6*  --   --   --   HCT 43.4  --  41.7   < > 40.8  --  43.0  --  39.0  --   --  34.3*  --   --   --   PLT 101*  --  91*  --  78*  --   --   --   --   --   --  59*  --   --   --   APTT  --   --   --   --   --   --   --   --   --   --   --   --   --   --  53*  HEPARINUNFRC  --   --   --   --   --   --   --  0.78*  --   --  0.78*  --  0.33  --   --   CREATININE 5.17*  --  5.21*  --   --    < >  --   --   --  2.44*  --  1.75*  --  1.33*  --   CKTOTAL  --   --  160  --   --   --   --   --   --   --   --   --   --   --   --   TROPONINIHS 23* 27* 105*  --   --   --   --   --   --   --   --   --   --   --   --    < > = values in this interval not displayed.    Assessment/Plan:  71yo male therapeutic on argatroban with initial dosing for PE. Will continue infusion at current rate of 0.5 mcg/kg/min and confirm stable with additional PTT.   Wynona Neat, PharmD, BCPS  11/10/2021,2:57 AM

## 2021-11-10 NOTE — Evaluation (Signed)
Clinical/Bedside Swallow Evaluation Patient Details  Name: Harry James MRN: [id number] Date of Birth: 02/24/1950  Today's Date: 11/10/2021 Time: SLP Start Time (ACUTE ONLY): 3329 SLP Stop Time (ACUTE ONLY): 0929 SLP Time Calculation (min) (ACUTE ONLY): 8 min  Past Medical History:  Past Medical History:  Diagnosis Date   Chronic kidney disease    has one functional kidney   Diabetes mellitus    prediabetes   Hyperlipidemia    Hypertension    Polycythemia, secondary 01/25/2014   Tobacco abuse    Ulcer    perforated duodenal ulcer 12/12   Past Surgical History:  Past Surgical History:  Procedure Laterality Date   CYSTECTOMY  under tongue   LAPAROTOMY  12/16/2010   [name] patch perf du Procedure: EXPLORATORY LAPAROTOMY;  Surgeon: [name], MD;  Location: [facility];  Service: General;  Laterality: N/A;   HPI:  Harry James is a 71 y.o. M who presented to the ED at [facility] after several days of decreased UOP and generalized weakness with increasing shortness of breath.  He had a fall without LOC on the day of admission and EMS was called. CXR 10/27 with both lungs clear, and no active disease. Pt with PMH of CKD stage 3b, solitary kidney, DM, HTN, HL, tobacco use.    Assessment / Plan / Recommendation  Clinical Impression  Pt presents with clinical s/s of pharyngeal dysphagia.  Pt with wet/congsted cough at baseline which was noted to continuie throughout assessment in isolation and with all PO trials.  It became significantly worse and stronger with trials of regular texture solids.  Pt stated that the food was dry.  Pt is bothered by coughing with PO intake.  Coughing was reduced to baseline level with soft solid trial, with applesauce used to moisten graham cracker.  Pt stated this was easier to eat and felt better.  CXR 10/27 with clear lungs; however, recommend further assessment of swallow function based on clinical presentation.  Pt receiving CRRT this morning and unable to  transport for MBS.  SLP to follow for readiness for instrumental swallow evaluation.    Recommend mechanical soft diet with thin liquids at present.  SLP Visit Diagnosis: Dysphagia, unspecified (R13.10)    Aspiration Risk  Mild aspiration risk    Diet Recommendation Dysphagia 3 (Mech soft);Thin liquid   Liquid Administration via: Cup;Straw Medication Administration: Whole meds with liquid Supervision: Staff to assist with self feeding Compensations: Slow rate;Small sips/bites Postural Changes: Seated upright at 90 degrees    Other  Recommendations Oral Care Recommendations: Oral care BID    Recommendations for follow up therapy are one component of a multi-disciplinary discharge planning process, led by the attending physician.  Recommendations may be updated based on patient status, additional functional criteria and insurance authorization.  Follow up Recommendations  (pending results of MBSS)      Assistance Recommended at Discharge    Functional Status Assessment Patient has had a recent decline in their functional status and demonstrates the ability to make significant improvements in function in a reasonable and predictable amount of time.  Frequency and Duration  (pending results of MBSS)          Prognosis Prognosis for Safe Diet Advancement:  (pending results of MBSS)      Swallow Study   General Date of Onset: 11/04/2021 HPI: Harry James is a 71 y.o. M who presented to the ED at [facility] after several days of decreased UOP and generalized weakness with increasing shortness of  breath.  He had a fall without LOC on the day of admission and EMS was called. CXR 10/27 with both lungs clear, and no active disease. Pt with PMH of CKD stage 3b, solitary kidney, DM, HTN, HL, tobacco use. Type of Study: Bedside Swallow Evaluation Diet Prior to this Study: Regular;Thin liquids Temperature Spikes Noted: No Respiratory Status: Nasal cannula History of Recent Intubation:  No Behavior/Cognition: Alert;Cooperative;Pleasant mood Oral Cavity Assessment: Within Functional Limits Oral Care Completed by SLP: No Oral Cavity - Dentition: Adequate natural dentition Self-Feeding Abilities: Needs assist Patient Positioning: Upright in bed    Oral/Motor/Sensory Function Overall Oral Motor/Sensory Function: Mild impairment Facial ROM: Within Functional Limits Facial Symmetry: Within Functional Limits Lingual ROM: Reduced left Lingual Symmetry: Abnormal symmetry left (Pt reports this is from surgical procedure as a child) Lingual Strength: Within Functional Limits Velum: Within Functional Limits Mandible: Within Functional Limits   Ice Chips Ice chips: Not tested   Thin Liquid Thin Liquid: Within functional limits Presentation: Straw    Nectar Thick     Honey Thick     Puree Puree: Within functional limits Presentation: Spoon   Solid     Solid: Impaired Pharyngeal Phase Impairments: Cough - Immediate      Celedonio Savage, MA, [place] Office: [phone] 11/10/2021,11:09 AM

## 2021-11-10 NOTE — Consult Note (Signed)
Palliative Medicine Inpatient Consult Note  Consulting Provider: Georgena Spurling  Reason for consult:   [place] Palliative Medicine Consult  Reason for Consult? RV failure   11/10/2021  HPI:  Per intake H&P --> Mr. 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 Palliative care was consulted for additional goals of care conversations in the setting of acute on chronic illness.   Clinical Assessment/Goals of Care:  *Please note that this is a verbal dictation therefore any spelling or grammatical errors are due to the "[place]" system interpretation.  I have reviewed medical records including EPIC notes, labs and imaging, received report from bedside RN, assessed the patient.    I met with Simona Huh at the bedside to further discuss diagnosis prognosis, GOC, EOL wishes, disposition and options.   I introduced Palliative Medicine as specialized medical care for people living with serious illness. It focuses on providing relief from the symptoms and stress of a serious illness. The goal is to improve quality of life for both the patient and the family.  Medical History Review and Understanding: Eathen is aware that he is in the hospital due to having a blood clot in his lungs, as well as his heart failure and worsening kidney function. He is aware that he is on temporary dialysis (CRRT) to help his kidneys.   Social History: Jahrell is an independent, single man, with no children, who lives alone. He was born in [place] and then moved here. He enjoys playing his guitar at home.   Functional and Nutritional State: Prior to arriving to the hospital, Dalessandro states that he  had not been able to drive a vehicle with in the last year due to his shortness of breath when mobilizing. He currently relies on friends to do his grocery shopping for him and they help with taking the trash out. He states that his friend Richardson Landry has been there for him. He states that his appetite consist of soups and sandwiches, and noticed his appetite completely went away a couple days before coming to the hospital.   Palliative Symptoms: Shortness of Breath - On Oxygen   Advance Directives: A detailed discussion was had today regarding advanced directives.    Code Status: Concepts specific to code status, artifical feeding and hydration, continued IV antibiotics and rehospitalization was had.  The difference between a aggressive medical intervention path  and a palliative comfort care path for this patient at this time was had.   Encouraged patient/family to consider DNR/DNI status understanding evidenced based poor outcomes in similar hospitalized patient, as the cause of arrest is likely associated with advanced chronic/terminal illness rather than an easily reversible acute cardio-pulmonary event. I explained that DNR/DNI does not change the medical plan and it only comes into effect after a person has arrested (died).  It is a protective measure to keep [place] from harming the patient in their last moments of life. Shizuo was agreeable to DNR/DNI with understanding that he would not receive CPR, defibrillation, ACLS medications, or intubation.   Discussion: Today I met with Simona Huh at the bedside to discuss goals of care. Chukwuemeka is aware that he is sick and requiring multiple levels of support to keep his body working. When I  asked Renn if he has ever had questions regarding his wishes, he denied. I started with dialysis and made him aware that even though he is on temporary dialysis, if this is not successful in regaining his kidney function, he may need long term dialysis, if deemed a candidate.  Ahmeer states he does NOT think that he would be able too live a meaningful life being on dialysis. He states that he does not have the support to be able to manage going to/from dialysis and does not think his body would be able to tolerate dialysis for a long time. Arif expressed that he would NOT want long term dialysis. The conversation then turned to artifical nutrition and I explained both short term and long term modes of artifical nutrition, and Tauno states he would be open to both but wanted to make a firm decision if/when that time comes.   We talked about Code Status and I made Kyser aware that given his disease trajectory, CPR and Mechanical Ventilation is not guaranteed to be successful and may not allow him to live a life focused on quality. Cormick states that "I would not want to be laying in bed as a vegetable".   I gently touched on the topic of disposition and made Orlondo aware that if he leaves the hospital, that he would need additional care. I explored his thoughts on a SNF and he states he would be okay with this if necessary. After explaining that given his current status and goals of getting home, he may not ever leave the SNF to go home. Aarish expressed that it was important to him that he was at home. With that being said, I approached the topic of Hospice. Kennett states that he is familiar with Hospice and feels as if things do not get better, this would be the avenues that aligns with his goals of maintaining a a life of quality over quantity.   Arav gave [place] permission to talk with his brother, Legrand Como, with all of these conversations. I reached out to Welcome who lives in [place]. Legrand Como states he has not been able to come up here to see [place] d/t having a LVAD. He states that Richardson Landry is the family friend that would be here in the hospital and is aware of the situation at hand. I reiterated the conversation I had with Simona Huh today and Legrand Como states that he is agreeable  to whatever Legrand Como wants to do. He states that Marquise has never really shared his wishes with him, and we should take his word for it. Legrand Como is going to continue to have conversations with Simona Huh and speak with Richardson Landry to be there for [place].   Discussed the importance of continued conversation with family and their  medical providers regarding overall plan of care and treatment options, ensuring decisions are within the context of the patients values and GOCs.  Decision Maker: Simona Huh, if unable -> Brother - Legrand Como   SUMMARY OF RECOMMENDATIONS   - Initiate DNR/DNI - Continue with Primary Team Care Plan - Allow time for outcomes --> Patient would be open to hospice if no improvements - PMT will continue to Follow  Code Status/Advance Care Planning: DNAR/DNI  Symptom Management:  Shortness of Breath - on oxygen  Palliative Prophylaxis:  Aspiration, Bowel Regimen, Delirium Protocol, Frequent Pain Assessment, Oral Care, Palliative Wound Care, and Turn Reposition  Additional Recommendations (Limitations, Scope, Preferences): Continue current scope of care allowing time for outcomes  Psycho-social/Spiritual:  Desire for  further Chaplaincy support:  Additional Recommendations:    Prognosis: Guarded   Discharge Planning: Unable to determine   Review of Systems  Constitutional:  Positive for malaise/fatigue.  Respiratory:  Positive for cough, sputum production and shortness of breath.   Cardiovascular: Negative.   Neurological:  Positive for weakness.  All other systems reviewed and are negative.  Mobility: Limited in bed  Vitals:   11/10/21 0900 11/10/21 1000  BP: (!) 101/51   Pulse: (!) 106 (!) 102  Resp: (!) 24 20  Temp:    SpO2: 93% 93%    Intake/Output Summary (Last 24 hours) at 11/10/2021 1055 Last data filed at 11/10/2021 1000 Gross per 24 hour  Intake 559.02 ml  Output 1829 ml  Net -1269.98 ml   Last Weight  Most recent update: 11/10/2021  5:35 AM    Weight   108.2 kg (238 lb 8.6 oz)            Gen:  Critically ill appearing caucasian M in NAD HEENT: moist mucous membranes CV: Irregular rate and rhythm, no murmurs rubs or gallops PULM:ON 2LPM [place], Rhonchi to auscultation bilaterally.  ABD: soft/nontender/nondistended/hypoactive bowel sounds EXT: Generalized edema Neuro: Alert and oriented x3  PPS: 30%   This conversation/these recommendations were discussed with patient primary care team, Dr. Tamala Julian and [place], PA-C  Total Time: 110  Billing based on MDM: High  Problems Addressed: One acute or chronic illness or injury that poses a threat to life or bodily function  Amount and/or Complexity of Data: Category 3:Discussion of management or test interpretation with external physician/other qualified health care professional/appropriate source (not separately reported)  Risks: Decision regarding hospitalization or escalation of hospital care and Decision not to resuscitate or to de-escalate care because of poor prognosis ______________________________________________________ [place] Team Team Cell Phone: [phone] Please utilize secure chat with additional questions, if there is no response within 30 minutes please call the above phone number  Palliative Medicine Team providers are available by phone from 7am to 7pm daily and can be reached through the team cell phone.  Should this patient require assistance outside of these hours, please call the patient's attending physician.

## 2021-11-10 NOTE — Progress Notes (Signed)
NAME:  Harry James, MRN:  [id number], DOB:  [birth year], LOS: 3 ADMISSION DATE:  11/08/2021, CONSULTATION DATE:  11/08/2021 REFERRING MD:  EDP, CHIEF COMPLAINT:  Weakness, Fall     History of Present Illness:  Harry James is a 71 y.o. M with PMH of CKD stage 3b, solitary kidney, DM, HTN, HL, tobacco use who presented to the ED at [facility] after several days of decreased UOP and generalized weakness with increasing shortness of breath.  He had a fall without LOC on the day of admission and EMS was called.   On chart review it appears his PCP has been concerned about rising creatinine over the last several months and had him stop taking Lasix.   He denies significant chest pain or recent fevers, no abdominal pain, n/v/d.    He was initially hypotensive in the ED with negative CT abd/pelvis, C-spine and head. Labs showed Creatinine of 5.17, K 6.1, bicarb 7 and AG 16, pH 7.1, pCO2 25, trop 27, WBC 11k, leuks and bacteria on UA, lactic acid 1.6.  He was initially given 3L IVF and broad spectrum antibiotics then Lasix along with insulin and calcium.  He was transferred to [facility] ED where he arrived on Vaso and Levophed 29mg, on RA with tachypnea and still mentating.  PCCM consulted for admission  Pertinent  Medical History  CKD IIIb  Solitary Kidney DM Type II  HTN HL Significant Hospital Events: Including procedures, antibiotic start and stop dates in addition to other pertinent events   10/24 [facility] and renal [facility] on levo and vaso.  Started on cefepime, flagyl, vancomycin. 10/25 - Remains on levo at 1651m and Vaso at 0.3. Attempting to decrease pressor requirements.  10/26 - Decreasing pressor requirements. Vaso is off and Levo at 51m60m Echo shows severely enlarged RA and RV with RVSP of  2m40mand LVEF of 60-65%.  VQ scan showing left-sided wedge-shaped perfusion deficit which was noted is moderate to large in the superior segment of the left lower  lobe in the apicoposterior segment of the left upper lobe and lingula consistent with high probability of pulmonary emboli anticoagulation resumed in the setting of argatroban.  Attempted inotropic support, low-dose dobutamine triggered atrial fibrillation with RVR and therefore aborted.  Advanced heart failure team consulted for additional recommendations 10/27 back in atrial fibrillation amiodarone initiated platelet count still low stopping aspirin continuing amiodarone hemoglobin drifting down 1 g watching closely.  Interim History / Subjective:  Off pressors.  Nursing report coughing a little bit with p.o. intake.  Now in atrial fibrillation Objective   Blood pressure (Abnormal) 169/78, pulse (Abnormal) 113, temperature 97.7 F (36.5 C), temperature source Axillary, resp. rate (Abnormal) 26, height '5\' 5"'$  (1.651 m), weight 108.2 kg, SpO2 92 %. CVP:  [11 mmHg-28 mmHg] 26 mmHg      Intake/Output Summary (Last 24 hours) at 11/10/2021 0733 Last data filed at 11/10/2021 0700 Gross per 24 hour  Intake 556.28 ml  Output 1858 ml  Net -1301.72 ml   Filed Weights   11/08/21 0500 11/09/21 0500 11/10/21 0500  Weight: 110 kg 110.1 kg 108.2 kg    Examination: General obese 71 y53r old male patient resting in bed he is in no acute distress today HEENT normocephalic atraumatic, right IJ cath in place still has a fair amount of ecchymosis around this area mucous membranes moist, unable to assess JVD given neck size Pulmonary: Diminished, some scattered rhonchi, no accessory use Cardiac:  Currently in atrial fibrillation with rapid ventricular response heart rate varying in the 120s to 130s Abdomen soft nontender no organomegaly Extremities: Chronic venous stasis and arterial inches in lower extremities.  He has a fair amount of ecchymosis in the right femoral HD site.  This is fairly firm to palpation however is unchanged in regards to exam from day prior Neurologic: Awake oriented no focal  deficits.  Resolved Hospital Problem list   Elevated troponin Cardiogenic shock Assessment & Plan:   Cor pulmonale, with cardiorenal syndrome. Probably a mixed picture of acute pulmonary emboli and untreated sleep apnea with secondary pulmonary hypertension Plan Appreciate heart failure recommendations Currently pulling fluid, may need to back off on this with new arrhythmia I suspect he may need higher filling pressure Continue telemetry monitoring Nocturnal oxygen Holding goal-directed medical therapy given borderline hypotension I think he will need CPAP or perhaps even BiPAP at night We will discontinue hydrocortisone Additional recommendations per heart failure team  New onset atrial fibrillation with RVR -He initially had bout of paroxysmal atrial fibrillation yesterday on 10/26 when I initiated inotropic therapy this stopped spontaneously after stopping dobutamine, he is now back in atrial fibrillation.  I wonder if this means we are close to euvolemia for him Plan Continuing telemetry monitoring Load and initiate amiodarone therapy Anticoagulation as mentioned with argatroban Defer additional recommendations per cardiology   Acute on chronic CKD 3b renal failure Hyperkalemia, hypocalcemia Appears did not follow up with PCP for worsening renal function -appreciate nephrology recs Plan CRRT per nephrology Renal adjust medications A.m. chemistry  Pulmonary emboli Plan Argatroban for now, likely DOAC at discharge  Obesity ? OSA  Plan Nocturnal pulse oximetry    Progressive thrombocytopenia Heparin stopped on 10/26, started argatroban.  Platelet about the same, hemoglobin has dropped a gram, will have to watch this closely plan Continuing argatroban Close observation of H&H as well as follow-up CBC in the morning  Type 2 DM Excellent control Plan Continue current sliding scale   Chronic thoracic and abdominal aortic dissection Aneurysmal infrarenal  abdominal aorta, increased size from prior Plan Vascular evaluation eventually will be needed  Best Practice (right click and "Reselect all SmartList Selections" daily)   Diet/type: Regular consistency (see orders) DVT prophylaxis: systemic heparin GI prophylaxis: N/A Lines: Central line and yes and it is still needed Foley:  Yes, and it is still needed Code Status:  full code  Critical care time: 31 min   Erick Colace ACNP-BC [place] Pager # [phone] OR # [phone] if no answer

## 2021-11-10 NOTE — Progress Notes (Signed)
[facility] Progress Note   Subjective:   V/Q + PE yest, on argatroban now. CRRT continues for volume offloading given CVP 19 -tolerating UF.  He has no new complaints.  UOP only 143m yest  Objective Vitals:   11/10/21 0800 11/10/21 0842 11/10/21 0900 11/10/21 1000  BP: (!) 100/50  (!) 101/51   Pulse: (!) 102  (!) 106 (!) 102  Resp: 19  (!) 24 20  Temp:  97.7 F (36.5 C)    TempSrc:  Axillary    SpO2: 91%  93% 93%  Weight:      Height:       Physical Exam General: obese man who is awake and alert in bed Heart: RRR no rub Lungs: coarse BL Abdomen: obese, soft Extremities: no edema Dialysis Access: R femoral temp cath, bloody dressing but not actively bleeding  Additional Objective Labs: Basic Metabolic Panel: Recent Labs  Lab 11/09/21 0436 11/09/21 1808 11/10/21 0430  NA 138 140 137  K 4.1 4.0 4.0  CL 104 106 103  CO2 23 23 21*  GLUCOSE 126* 103* 128*  BUN '23 17 14  '$ CREATININE 1.75* 1.33* 1.20  CALCIUM 7.0* 6.4* 7.3*  PHOS 2.5 2.1* 2.0*    Liver Function Tests: Recent Labs  Lab 10/15/2021 1101 10/17/2021 2045 11/08/21 0025 11/09/21 0436 11/09/21 1808 11/10/21 0430  AST 15 17  --   --   --   --   ALT 18 18  --   --   --   --   ALKPHOS 190* 179*  --   --   --   --   BILITOT 1.0 1.2  --   --   --   --   PROT 6.2* 5.6*  --   --   --   --   ALBUMIN 3.1* 2.8*   < > 2.4* 2.2* 2.7*   < > = values in this interval not displayed.    No results for input(s): "LIPASE", "AMYLASE" in the last 168 hours. CBC: Recent Labs  Lab 10/30/2021 1101 10/29/2021 2045 11/04/2021 2110 11/08/21 0024 11/08/21 0500 11/08/21 1257 11/09/21 0436 11/10/21 0430  WBC 9.3 11.0*  --  11.9*  --   --  9.9 7.8  HGB 13.5 13.4   < > 13.9   < > 13.3 11.6* 10.6*  HCT 43.4 41.7   < > 40.8   < > 39.0 34.3* 31.4*  MCV 100.5* 97.7  --  92.7  --   --  93.0 94.0  PLT 101* 91*  --  78*  --   --  59* 56*   < > = values in this interval not displayed.    Blood Culture     Component Value Date/Time   SDES  11/02/2021 1408    URINE, CATHETERIZED [facility] [address] [place] [address]   [facility] 10/22/2021 1408    NONE [facility] [address] [id number]   CULT 80,000 COLONIES/mL STAPHYLOCOCCUS EPIDERMIDIS (A) 10/29/2021 1408   REPTSTATUS 11/10/2021 FINAL 10/31/2021 1408    Cardiac Enzymes: Recent Labs  Lab 10/31/2021 2045  CKTOTAL 160    CBG: Recent Labs  Lab 11/09/21 1547 11/09/21 1930 11/10/21 0001 11/10/21 0357 11/10/21 0839  GLUCAP 117* 107* 118* 126* 155*    Iron Studies: No results for input(s): "IRON", "TIBC", "TRANSFERRIN", "FERRITIN" in the last 72 hours. '@lablastinr3'$ @ Studies/Results: DG Chest Port 1 View  Result Date:  11/10/2021 CLINICAL DATA:  Shortness of breath. EXAM: PORTABLE CHEST 1 VIEW COMPARISON:  November 09, 2021. FINDINGS: Stable cardiomegaly. Right internal jugular catheter is unchanged. Both lungs are clear. The visualized skeletal structures are unremarkable. IMPRESSION: No active disease. Electronically Signed   By: Marijo Conception M.D.   On: 11/10/2021 08:37   DG CHEST PORT 1 VIEW  Result Date: 11/09/2021 CLINICAL DATA:  91209 Atelectasis 91209 EXAM: PORTABLE CHEST 1 VIEW COMPARISON:  Chest x-ray 10/19/2021, CT chest 11/02/2021 FINDINGS: Right subclavian venous central catheter with tip overlying the expected region of the distal superior vena cava. Enlarged cardiac silhouette. The heart and mediastinal contours are grossly unchanged with slightly limited evaluation due to patient rotation. Aortic calcification. Aneurysmal descending thoracic aorta better evaluated on CT chest 11/08/2021. Left base atelectasis. No focal consolidation. No pulmonary edema. No pleural effusion. No pneumothorax. No acute osseous abnormality. IMPRESSION: 1. Right lower lobe atelectasis. 2. Cardiomegaly. 3. Aortic Atherosclerosis (ICD10-I70.0). 4. Aneurysmal descending  thoracic aorta better evaluated on CT chest 10/16/2021. Aortic aneurysm NOS (ICD10-I71.9). Electronically Signed   By: Iven Finn M.D.   On: 11/09/2021 18:13   NM Pulmonary Perfusion  Result Date: 11/09/2021 CLINICAL DATA:  Shortness of breath EXAM: NUCLEAR MEDICINE PERFUSION LUNG SCAN TECHNIQUE: Perfusion images were obtained in multiple projections after intravenous injection of radiopharmaceutical. RADIOPHARMACEUTICALS:  3.9 mCi Tc-69mMAA IV COMPARISON:  Chest x-ray 11/09/2021 FINDINGS: Multiple predominantly left-sided wedge-shaped perfusion defects. Moderate to large perfusion defect in the region of superior segment of left lower lobe, apicoposterior segment of left upper lobe and smaller wedge-shaped defect in the region of the lingular segment of left upper lobe. No significant right-sided perfusion defects. IMPRESSION: Pulmonary embolism present based on PISAPED criteria These results will be called to the ordering clinician or representative by the Radiologist Assistant, and communication documented in the PACS or [employer] Electronically Signed   By: KDonavan FoilM.D.   On: 11/09/2021 17:21   Medications:   prismasol BGK 4/2.5 400 mL/hr at 11/10/21 0227    prismasol BGK 4/2.5 400 mL/hr at 11/10/21 0226   sodium chloride Stopped (11/09/21 1905)   amiodarone 60 mg/hr (11/10/21 0900)   Followed by   amiodarone     argatroban 0.5 mcg/kg/min (11/10/21 0900)   norepinephrine (LEVOPHED) Adult infusion Stopped (11/09/21 0803)   prismasol BGK 4/2.5 1,500 mL/hr at 11/10/21 06553   Chlorhexidine Gluconate Cloth  6 each Topical Daily   folic acid  1 mg Oral Daily   hydrocortisone sod succinate (SOLU-CORTEF) inj  100 mg Intravenous Q12H   insulin aspart  0-15 Units Subcutaneous Q4H   lidocaine-EPINEPHrine  10 mL Other Once   multivitamin with minerals  1 tablet Oral Daily   nicotine  14 mg Transdermal Daily   pantoprazole (PROTONIX) IV  40 mg Intravenous Q24H   sodium chloride  flush  10-40 mL Intracatheter Q12H   thiamine  100 mg Oral Daily   Or   thiamine  100 mg Intravenous Daily    Assessment/Recommendations:    AKI on CKD 3 (bl Cr~1.7-1.8), oliguric -AKI secondary to ischemic ATN in the setting of shock with concomitant ARB/SGLT2i use -given severe acidemia & hyperK, start CRRT 10/24PM. Appreciate CCM's assistance with temp line placement -Cont 4K/2.5Ca; -UF goal 1067mhr neg - CVP ~19 - aiming for 10-12 per CHF -Making some urine - watch for signs of renal recovery -Avoid nephrotoxic medications including NSAIDs and iodinated intravenous contrast exposure unless the latter is absolutely indicated.  Preferred  narcotic agents for pain control are hydromorphone, fentanyl, and methadone. Morphine should not be used. Avoid Baclofen and avoid oral sodium phosphate and magnesium citrate based laxatives / bowel preps. Continue strict Input and Output monitoring. Will monitor the patient closely with you and intervene or adjust therapy as indicated by changes in clinical status/labs    Shock: off pressors now.  In the setting of PE/RV compromise  PE: on argatroban now, no plans for thrombectomy given risk for CIN and hemodynamic stability   Anion Gap Metabolic acidosis -likely stemming from AKI but there is a possibility of euglycemic DKA in the context of Farxiga, CRRT as above. lactate WNL -off bicarb gtt now, cont CRRT for the time being   Thrombocytopenia - heparin off per primary - will avoid with dialysis  Jannifer Hick MD 11/10/2021, 10:06 AM  [facility] Pager: [phone]

## 2021-11-10 NOTE — Progress Notes (Signed)
[facility] for Heparin >> argatroban Indication: possible VTE/PE  No Known Allergies  Patient Measurements: Height: '5\' 5"'$  (165.1 cm) Weight: 110.1 kg (242 lb 11.6 oz) IBW/kg (Calculated) : 61.5 Heparin Dosing Weight: 85 kg  Vital Signs: Temp: 97.7 F (36.5 C) (10/27 0300) Temp Source: Axillary (10/27 0300) BP: 113/73 (10/27 0400) Pulse Rate: 103 (10/27 0400)  Labs: Recent Labs    10/22/2021 1101 11/12/2021 1255 11/06/2021 2045 11/12/2021 2110 11/08/21 0024 11/08/21 0025 11/08/21 1013 11/08/21 1257 11/08/21 1656 11/08/21 2058 11/09/21 0436 11/09/21 0555 11/09/21 1808 11/09/21 2304 11/10/21 0430  HGB 13.5  --  13.4   < > 13.9   < >  --  13.3  --   --  11.6*  --   --   --  10.6*  HCT 43.4  --  41.7   < > 40.8   < >  --  39.0  --   --  34.3*  --   --   --  31.4*  PLT 101*  --  91*  --  78*  --   --   --   --   --  59*  --   --   --  56*  APTT  --   --   --   --   --   --   --   --   --   --   --   --   --  53* 56*  HEPARINUNFRC  --   --   --   --   --   --  0.78*  --   --  0.78*  --  0.33  --   --   --   CREATININE 5.17*  --  5.21*  --   --    < >  --   --    < >  --  1.75*  --  1.33*  --  1.20  CKTOTAL  --   --  160  --   --   --   --   --   --   --   --   --   --   --   --   TROPONINIHS 23* 27* 105*  --   --   --   --   --   --   --   --   --   --   --   --    < > = values in this interval not displayed.     Estimated Creatinine Clearance: 63.7 mL/min (by C-G formula based on SCr of 1.2 mg/dL).   Medical History: Past Medical History:  Diagnosis Date   Chronic kidney disease    has one functional kidney   Diabetes mellitus    prediabetes   Hyperlipidemia    Hypertension    Polycythemia, secondary 01/25/2014   Tobacco abuse    Ulcer    perforated duodenal ulcer 12/12    Medications:  No current facility-administered medications on file prior to encounter.   Current Outpatient Medications on File Prior to Encounter   Medication Sig Dispense Refill   albuterol (VENTOLIN HFA) 108 (90 Base) MCG/ACT inhaler Inhale 2 puffs into the lungs every 6 (six) hours as needed for wheezing or shortness of breath. 8.5 g 1   cloNIDine (CATAPRES) 0.3 MG tablet TAKE 1 TABLET(0.3 MG) BY MOUTH TWICE DAILY (Patient taking differently: Take 0.3 mg by mouth 2 (two) times daily.) 180 tablet 0  dapagliflozin propanediol (FARXIGA) 10 MG TABS tablet TAKE 1 TABLET(10 MG) BY MOUTH DAILY BEFORE BREAKFAST (Patient taking differently: Take 10 mg by mouth daily.) 30 tablet 3   Fish Oil OIL Take 1 tablet by mouth daily.     furosemide (LASIX) 80 MG tablet Take 1 tablet (80 mg total) by mouth daily. 90 tablet 3   metoprolol tartrate (LOPRESSOR) 100 MG tablet TAKE 1 TABLET BY MOUTH TWICE DAILY (Patient taking differently: Take 100 mg by mouth 2 (two) times daily.) 180 tablet 2   Multiple Vitamins-Minerals (MULTIVITAMINS THER. W/MINERALS) TABS Take 1 tablet by mouth daily. 30 each    omeprazole (PRILOSEC) 40 MG capsule TAKE 1 CAPSULE(40 MG) BY MOUTH DAILY (Patient taking differently: Take 40 mg by mouth daily.) 90 capsule 3   TRELEGY ELLIPTA 100-62.5-25 MCG/ACT AEPB INHALE 1 PUFF INTO THE LUNGS DAILY 60 each 3   valsartan (DIOVAN) 160 MG tablet TAKE 1 TABLET(160 MG) BY MOUTH DAILY (Patient taking differently: Take 160 mg by mouth daily.) 90 tablet 3   doxazosin (CARDURA) 2 MG tablet TAKE 1 TABLET(2 MG) BY MOUTH DAILY (Patient not taking: Reported on 10/29/2021) 90 tablet 3   simvastatin (ZOCOR) 40 MG tablet TAKE 1 TABLET BY MOUTH EVERY EVENING (Patient taking differently: Take 40 mg by mouth daily at 6 PM.) 90 tablet 1   traMADol (ULTRAM) 50 MG tablet TAKE 2 TABLETS(100 MG) BY MOUTH FOUR TIMES DAILY (Patient not taking: Reported on 10/16/2021) 60 tablet 0     Assessment: 71 y.o. male who presented with weakness and shortness of breath, then shock requiring vasopressors (now off) and AKI continuing on CRRT. Pharmacy consulted for heparin initially  due to possible PE/DVT but held 10/26 AM due to platelets dropping. Doppler 10/25 showed no evidence of DVT; however, V/Q scan completed 10/26 shows PE present. Patient has acute right heart failure. Pharmacy consulted to start argatroban.    aPTT is therapeutic at 56 today. Hg down to 10.6, plt down to 56 (101 on admission), LFTs WNL. No bleeding issues reported.  Goal of Therapy:  aPTT 50-90 seconds Monitor platelets by anticoagulation protocol: Yes   Plan:  Continue argatroban 0.5 mcg/kg/min   Check daily aPTT Monitor daily CBC, s/sx bleeding  Thank you for allowing pharmacy to participate in this patient's care.  Reatha Harps, PharmD PGY2 Pharmacy Resident 11/10/2021 5:30 AM Check [email or web address] for unit specific pharmacy number

## 2021-11-10 NOTE — Telephone Encounter (Signed)
Pharmacy Patient Advocate Encounter  Insurance verification completed.    The patient is insured through [employer] Part D   The patient is currently admitted and ran test claims for the following: Eliquis, Delene Loll, Farxiga, Jardiance.  Copays and coinsurance results were relayed to Inpatient clinical team.

## 2021-11-10 NOTE — Progress Notes (Signed)
eLink Physician-Brief Progress Note Patient Name: Harry James DOB: 03-Apr-1950 MRN: [id number]   Date of Service  11/10/2021  HPI/Events of Note  Hypertension - BP = 174/75  eICU Interventions  Plan: Hydralazine 10 mg IV Q 4 hours PRN SBP > 160 or DBP > 100.     Intervention Category Major Interventions: Hypertension - evaluation and management  Harry James 11/10/2021, 1:44 AM

## 2021-11-10 NOTE — Progress Notes (Signed)
Advanced Heart Failure Rounding Note  PCP-Cardiologist: None   Subjective:    Off pressors.   On argatroban for acute PE. Plt 59>>56K  In afib, 120s. On Amio gtt 30/hr   On CRRT. 1.7L volume removal yesterday + 155 cc in UOP.  SCr 5.21>>3.92>>1.75>>1.20  CVP still high at 17, coughing.   CRRT currently pulling 50cc/hr    Objective:   Weight Range: 108.2 kg Body mass index is 39.69 kg/m.   Vital Signs:   Temp:  [97.2 F (36.2 C)-97.7 F (36.5 C)] 97.7 F (36.5 C) (10/27 0300) Pulse Rate:  [70-114] 113 (10/27 0700) Resp:  [12-27] 26 (10/27 0700) BP: (48-169)/(13-108) 169/78 (10/27 0529) SpO2:  [86 %-100 %] 92 % (10/27 0700) Arterial Line BP: (109-173)/(43-77) 155/69 (10/27 0700) Weight:  [108.2 kg] 108.2 kg (10/27 0500) Last BM Date : 11/09/21  Weight change: Filed Weights   11/08/21 0500 11/09/21 0500 11/10/21 0500  Weight: 110 kg 110.1 kg 108.2 kg    Intake/Output:   Intake/Output Summary (Last 24 hours) at 11/10/2021 0814 Last data filed at 11/10/2021 0800 Gross per 24 hour  Intake 559.79 ml  Output 1828 ml  Net -1268.21 ml      Physical Exam    CVP 17  General:  chronically ill appearing obese No resp difficulty HEENT: Normal Neck: Supple. JVP elevated to jaw . Carotids 2+ bilat; no bruits. No lymphadenopathy or thyromegaly appreciated. Cor: PMI nondisplaced. Irregularly irregular rhythm and rate. No rubs, gallops or murmurs. Lungs: decreased BS at the bases bilaterally  Abdomen: obese, oft, nontender, nondistended. No hepatosplenomegaly. No bruits or masses. Good bowel sounds. Extremities: No cyanosis, clubbing, rash, 1+ b/l LE edema Neuro: Alert & orientedx3, cranial nerves grossly intact. moves all 4 extremities w/o difficulty. Affect pleasant   Telemetry   Afib w/ RVR 120s   EKG    Afib 95 bpm w/ RBBB   Labs    CBC Recent Labs    11/09/21 0436 11/10/21 0430  WBC 9.9 7.8  HGB 11.6* 10.6*  HCT 34.3* 31.4*  MCV 93.0 94.0   PLT 59* 56*   Basic Metabolic Panel Recent Labs    11/09/21 0436 11/09/21 1808 11/10/21 0430  NA 138 140 137  K 4.1 4.0 4.0  CL 104 106 103  CO2 23 23 21*  GLUCOSE 126* 103* 128*  BUN '23 17 14  '$ CREATININE 1.75* 1.33* 1.20  CALCIUM 7.0* 6.4* 7.3*  MG 2.2  --  2.4  PHOS 2.5 2.1* 2.0*   Liver Function Tests Recent Labs    11/14/2021 1101 10/18/2021 2045 11/08/21 0025 11/09/21 1808 11/10/21 0430  AST 15 17  --   --   --   ALT 18 18  --   --   --   ALKPHOS 190* 179*  --   --   --   BILITOT 1.0 1.2  --   --   --   PROT 6.2* 5.6*  --   --   --   ALBUMIN 3.1* 2.8*   < > 2.2* 2.7*   < > = values in this interval not displayed.   No results for input(s): "LIPASE", "AMYLASE" in the last 72 hours. Cardiac Enzymes Recent Labs    11/05/2021 2045  CKTOTAL 160    BNP: BNP (last 3 results) Recent Labs    10/31/2021 2045  BNP 1,235.5*    ProBNP (last 3 results) No results for input(s): "PROBNP" in the last 8760 hours.   D-Dimer  No results for input(s): "DDIMER" in the last 72 hours. Hemoglobin A1C No results for input(s): "HGBA1C" in the last 72 hours. Fasting Lipid Panel No results for input(s): "CHOL", "HDL", "LDLCALC", "TRIG", "CHOLHDL", "LDLDIRECT" in the last 72 hours. Thyroid Function Tests Recent Labs    10/15/2021 1101  TSH 3.848    Other results:   Imaging    DG CHEST PORT 1 VIEW  Result Date: 11/09/2021 CLINICAL DATA:  91209 Atelectasis 91209 EXAM: PORTABLE CHEST 1 VIEW COMPARISON:  Chest x-ray 10/28/2021, CT chest 10/29/2021 FINDINGS: Right subclavian venous central catheter with tip overlying the expected region of the distal superior vena cava. Enlarged cardiac silhouette. The heart and mediastinal contours are grossly unchanged with slightly limited evaluation due to patient rotation. Aortic calcification. Aneurysmal descending thoracic aorta better evaluated on CT chest 11/06/2021. Left base atelectasis. No focal consolidation. No pulmonary edema. No  pleural effusion. No pneumothorax. No acute osseous abnormality. IMPRESSION: 1. Right lower lobe atelectasis. 2. Cardiomegaly. 3. Aortic Atherosclerosis (ICD10-I70.0). 4. Aneurysmal descending thoracic aorta better evaluated on CT chest 11/04/2021. Aortic aneurysm NOS (ICD10-I71.9). Electronically Signed   By: Iven Finn M.D.   On: 11/09/2021 18:13   NM Pulmonary Perfusion  Result Date: 11/09/2021 CLINICAL DATA:  Shortness of breath EXAM: NUCLEAR MEDICINE PERFUSION LUNG SCAN TECHNIQUE: Perfusion images were obtained in multiple projections after intravenous injection of radiopharmaceutical. RADIOPHARMACEUTICALS:  3.9 mCi Tc-40mMAA IV COMPARISON:  Chest x-ray 11/01/2021 FINDINGS: Multiple predominantly left-sided wedge-shaped perfusion defects. Moderate to large perfusion defect in the region of superior segment of left lower lobe, apicoposterior segment of left upper lobe and smaller wedge-shaped defect in the region of the lingular segment of left upper lobe. No significant right-sided perfusion defects. IMPRESSION: Pulmonary embolism present based on PISAPED criteria These results will be called to the ordering clinician or representative by the Radiologist Assistant, and communication documented in the PACS or [employer] Electronically Signed   By: KDonavan FoilM.D.   On: 11/09/2021 17:21     Medications:     Scheduled Medications:  Chlorhexidine Gluconate Cloth  6 each Topical Daily   folic acid  1 mg Oral Daily   hydrocortisone sod succinate (SOLU-CORTEF) inj  100 mg Intravenous Q12H   insulin aspart  0-15 Units Subcutaneous Q4H   lidocaine-EPINEPHrine  10 mL Other Once   multivitamin with minerals  1 tablet Oral Daily   nicotine  14 mg Transdermal Daily   pantoprazole (PROTONIX) IV  40 mg Intravenous Q24H   sodium chloride flush  10-40 mL Intracatheter Q12H   thiamine  100 mg Oral Daily   Or   thiamine  100 mg Intravenous Daily    Infusions:   prismasol BGK 4/2.5 400  mL/hr at 11/10/21 0227    prismasol BGK 4/2.5 400 mL/hr at 11/10/21 0226   sodium chloride Stopped (11/09/21 1905)   amiodarone 60 mg/hr (11/10/21 0800)   Followed by   amiodarone     argatroban 0.5 mcg/kg/min (11/10/21 0800)   norepinephrine (LEVOPHED) Adult infusion Stopped (11/09/21 0803)   prismasol BGK 4/2.5 1,500 mL/hr at 11/10/21 0503    PRN Medications: docusate sodium, heparin, hydrALAZINE, LORazepam **OR** LORazepam, melatonin, mouth rinse, polyethylene glycol, sodium chloride flush    Patient Profile   71y.o. male with history of DM II, HTN, HLD, obesity, prior longstanding tobacco use, CKD with one functioning kidney, hx type B aortic dissection.    Admitted with shock, AKI and metabolic acidosis and acute PE  Assessment/Plan   Shock: -Presented with shock and severe metabolic acidosis. Required high-dose NE and Vaso. Pressors now off. Surprisingly, lactic acid not elevated. -BC X 2 NGTD. Procalcitonin negative. Now off abx -V/Q scan consistent with pulmonary embolism.    2. HFpEF with RV failure: -Echo this admit: EF 60-65%, RV severely dilated with severely reduced fxn, RVSP 58 mmHg, severe RAE, moderate LAE, dilated IVC -V/Q scan consistent with pulmonary embolism (now on argatroban for anticoagulation due to thrombocytopenia after heparin) -Volume overloaded on exam. CVP 17. Continue CRRT for volume removal, increase to pull 100cc/hr. Target CVP ~14 -Not sure if he would be a good candidate for long-term iHD if renal function does not recover. -Begin adding GDMT if BP/renal fx permits  -Plan for RHC in several days, likely early next week  3. PE - poor candidate for thrombectomy due to thrombocytopenia, significant comorbidities including acute renal failure.   - continue argatroban for anticoagulation due to thrombocytopenia after heparin  - plan eventual transition to Eliquis    4. Pulmonary HTN: -WHO Group 4 +/- Group 3 -V/Q + for PE  -Eventually  will need sleep study, suspect may have OSA/OHS -PFTs as outpatient. Evidence of emphysema on CT and many years of tobacco abuse   5. Anion gap metabolic acidosis: -Now off bicarb gtt -Continue CRRT per Nephrology   6. AKI on CKD IIIb: -AKI felt to be secondary to ischemic ATN in setting of shock and medications (ARB/SGLT2i) -Has one functioning kidney.  Note CT from 2004 suggests left renal artery involved in type B aortic dissection.  -Scr 1.7-1.8 over last year -Scr 5.2 on admit -Now on CRRT per Nephrology -Scr down to 1.20 today  -Making some urine, watch for renal recovery   7. HTN -BP elevated via [place].  -Will start meds slowly if tolerates more volume removal with CRRT    8. DM II: -Last A1c 5.7 07/23 -Per primary team -Off SGLT2i with shock and metabolic acidosis   9. Hyperkalemia: -resolved   10. Thrombocytopenia -Platelets 101>59>56K -? D/t critical illness vs HIT  -Now off heparin. Now on argatroban   11. Hx type B aortic dissection/thoracic and abdominal aortic aneurysms -Descending thoracic aortic aneurysm (4.6 cm) and infrarenal AAA (5.0 cm) noted on CT this admit -Eventually needs vascular eval  12. Afib w/ RVR - continue amio gtt - anticoagulation per above - keep K > 4.0 and Mg > 2.0      Length of Stay: [id number] PA-C  11/10/2021, 8:14 AM  Advanced Heart Failure Team Pager [phone] (M-F; 7a - 5p)  Please contact [facility] for night-coverage after hours (5p -7a ) and weekends on [email or web address]

## 2021-11-11 DIAGNOSIS — Z515 Encounter for palliative care: Secondary | ICD-10-CM | POA: Diagnosis not present

## 2021-11-11 DIAGNOSIS — Z7189 Other specified counseling: Secondary | ICD-10-CM | POA: Diagnosis not present

## 2021-11-11 DIAGNOSIS — I2609 Other pulmonary embolism with acute cor pulmonale: Secondary | ICD-10-CM | POA: Diagnosis not present

## 2021-11-11 LAB — RENAL FUNCTION PANEL
Albumin: 3 g/dL — ABNORMAL LOW (ref 3.5–5.0)
Anion gap: 9 (ref 5–15)
BUN: 8 mg/dL (ref 8–23)
CO2: 25 mmol/L (ref 22–32)
Calcium: 7.7 mg/dL — ABNORMAL LOW (ref 8.9–10.3)
Chloride: 103 mmol/L (ref 98–111)
Creatinine, Ser: 0.99 mg/dL (ref 0.61–1.24)
GFR, Estimated: >60 mL/min (ref >60)
Glucose, Bld: 130 mg/dL — ABNORMAL HIGH (ref 70–99)
Phosphorus: 1.4 mg/dL — ABNORMAL LOW (ref 2.5–4.6)
Potassium: 3.9 mmol/L (ref 3.5–5.1)
Sodium: 137 mmol/L (ref 135–145)

## 2021-11-11 LAB — CBC
HCT: 34.3 % — ABNORMAL LOW (ref 39.0–52.0)
Hemoglobin: 11.4 g/dL — ABNORMAL LOW (ref 13.0–17.0)
MCH: 32.1 pg (ref 26.0–34.0)
MCHC: 33.2 g/dL (ref 30.0–36.0)
MCV: 96.6 fL (ref 80.0–100.0)
Platelets: 82 10*3/uL — ABNORMAL LOW (ref 150–400)
RBC: 3.55 MIL/uL — ABNORMAL LOW (ref 4.22–5.81)
RDW: 15.2 % (ref 11.5–15.5)
WBC: 9.8 10*3/uL (ref 4.0–10.5)
nRBC: 0.7 % — ABNORMAL HIGH (ref 0.0–0.2)

## 2021-11-11 LAB — GLUCOSE, CAPILLARY
Glucose-Capillary: 114 mg/dL — ABNORMAL HIGH (ref 70–99)
Glucose-Capillary: 133 mg/dL — ABNORMAL HIGH (ref 70–99)

## 2021-11-11 LAB — MAGNESIUM: Magnesium: 2.3 mg/dL (ref 1.7–2.4)

## 2021-11-11 LAB — APTT: aPTT: 59 seconds — ABNORMAL HIGH (ref 24–36)

## 2021-11-11 MED ORDER — ACETAMINOPHEN 650 MG RE SUPP: 650.0000 mg | Freq: Four times a day (QID) | RECTAL | Status: DC | PRN

## 2021-11-11 MED ORDER — SODIUM CHLORIDE 0.9 % IV SOLN: INTRAVENOUS | Status: DC

## 2021-11-11 MED ORDER — ACETAMINOPHEN 325 MG PO TABS: 650.0000 mg | ORAL_TABLET | Freq: Four times a day (QID) | ORAL | Status: DC | PRN

## 2021-11-11 MED ORDER — LORAZEPAM 2 MG/ML IJ SOLN
2.0000 mg | INTRAMUSCULAR | Status: DC | PRN
  Administered 2021-11-11: 2 mg via INTRAVENOUS
  Filled 2021-11-11: qty 1

## 2021-11-11 MED ORDER — MORPHINE 100MG IN NS 100ML (1MG/ML) PREMIX INFUSION
0.0000 mg/h | INTRAVENOUS | Status: DC
  Administered 2021-11-11: 5 mg/h via INTRAVENOUS
  Filled 2021-11-11: qty 100

## 2021-11-11 MED ORDER — GLYCOPYRROLATE 1 MG PO TABS: 1.0000 mg | ORAL_TABLET | ORAL | Status: DC | PRN

## 2021-11-11 MED ORDER — MORPHINE BOLUS VIA INFUSION
5.0000 mg | INTRAVENOUS | Status: DC | PRN
  Administered 2021-11-11: 5 mg via INTRAVENOUS

## 2021-11-11 MED ORDER — GLYCOPYRROLATE 0.2 MG/ML IJ SOLN
0.2000 mg | INTRAMUSCULAR | Status: DC | PRN
  Administered 2021-11-11 (×2): 0.2 mg via INTRAVENOUS
  Filled 2021-11-11 (×2): qty 1

## 2021-11-11 MED ORDER — POLYVINYL ALCOHOL 1.4 % OP SOLN: 1.0000 [drp] | Freq: Four times a day (QID) | OPHTHALMIC | Status: DC | PRN

## 2021-11-11 MED ORDER — HALOPERIDOL LACTATE 5 MG/ML IJ SOLN: 2.5000 mg | INTRAMUSCULAR | Status: DC | PRN

## 2021-11-11 MED ORDER — GLYCOPYRROLATE 0.2 MG/ML IJ SOLN: 0.2000 mg | INTRAMUSCULAR | Status: DC | PRN

## 2021-11-12 LAB — CULTURE, BLOOD (ROUTINE X 2)
Culture: NO GROWTH
Culture: NO GROWTH

## 2021-11-15 NOTE — Progress Notes (Addendum)
Palliative Medicine Inpatient Follow Up Note   HPI: 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 Palliative care was consulted for additional goals of care conversations in the setting of acute on chronic illness.   Today's Discussion 11/29/21  *Please note that this is a verbal dictation therefore any spelling or grammatical errors are due to the "[employer]" system interpretation.  Chart reviewed inclusive of vital signs, progress notes, laboratory results, and diagnostic images.   I spoke to patients RN,  Harry James this morning. She shares that Harry James did not have a very restful night. We review that he has had only 26m of output.   I met with Harry James bedside this morning. Harry James that he is not well. He endorses that he "can't breath" and feels like he has been "running a marathon", we discussed the reasons why he is likely feeling this way in the setting of his heart failure. We reviewed that we can continue the present interventions though the long term trajectory is not promising and I worry we are providing short term fixes for long term problems. I also shared that his kidneys are a great concern as they may not start functioning again and Harry James clear that he does not want long term dialysis.   Created space and opportunity for patient to explore thoughts feelings and fears regarding current medical situation. We dicussed the idea of pursuing a comfort oriented approach to care. We talked about transition to comfort measures in house and what that would entail inclusive of medications to control pain, dyspnea, agitation, nausea, itching, and hiccups.  We discussed stopping all  uneccessary measures such as CRRT, cardiac monitoring, blood draws, needle sticks, and frequent vital signs. I shared with Harry James in his instance with high symptom burden we would likely start a dilaudid gtt. He vocalizes understanding and the desire to further consider this.   Harry James would additionally like to hear from the critical care team and nephrology to gather their thoughts on his long term outlook prior to making a decision on continued care versus comfort.   Emotional support provided through therapeutic listening.   Questions and concerns addressed/Palliative Support Provided.   Objective Assessment: Vital Signs Vitals:   [id number] 111/[id number]  BP: 122/76   Pulse: (!) 104   Resp: (!) 26   Temp:  (!) 96.4 F (35.8 C)  SpO2: 95%     Intake/Output Summary (Last 24 hours) at [id number] Last data filed at [id number] Gross per 24 hour  Intake 1118.93 ml  Output 3809 ml  Net -2690.07 ml   Last Weight  Most recent update: 11/10/2021  5:35 AM    Weight  108.2 kg (238 lb 8.6 oz)            Gen:  Critically ill appearing caucasian M in moderate distress HEENT: moist mucous membranes CV: Irregular rate and rhythm, no murmurs rubs or gallops PULM:On 3LPM [place], Rhonchi to auscultation bilaterally.  ABD: soft/nontender/nondistended/hypoactive bowel sounds EXT: Generalized edema Neuro: Alert and oriented x3  SUMMARY OF RECOMMENDATIONS   - DNR/DNI - Continue with Primary Team Care Plan - Candid conversation held in the setting of diease burden and trajectory - Discussed continuing  aggressive care versus transition to comfort today --> Patient would like to hear from CCM and nephrology in an effort to make a thoughtful decision - PMT will continue to Follow  Billing based on MDM: High  Problems Addressed: One acute or chronic illness or injury that poses a threat to life or bodily function  Amount and/or Complexity of Data: Category 3:Discussion of  management or test interpretation with external physician/other qualified health care professional/appropriate source (not separately reported)  Risks: Decision regarding hospitalization or escalation of hospital care and Decision not to resuscitate or to de-escalate care because of poor prognosis ______________________________________________________________________________________ Addendum:  Patient now on comfort measures per conversations with CCM.  Patients brother called and provided a comprehensive medical update.   [place] Team Team Cell Phone: [phone] Please utilize secure chat with additional questions, if there is no response within 30 minutes please call the above phone number  Palliative Medicine Team providers are available by phone from 7am to 7pm daily and can be reached through the team cell phone.  Should this patient require assistance outside of these hours, please call the patient's attending physician.

## 2021-11-15 NOTE — Progress Notes (Signed)
[place] examiner called to verify pt is not ME case. Pt transported to morgue.

## 2021-11-15 NOTE — Progress Notes (Signed)
NAME:  Harry James, MRN:  [id number], DOB:  Dec 02, 1950, LOS: 4 ADMISSION DATE:  11/12/2021, CONSULTATION DATE:  11/08/2021 REFERRING MD:  EDP, CHIEF COMPLAINT:  Weakness, Fall     History of Present Illness:  Harry James is a 71 y.o. M with PMH of CKD stage 3b, solitary kidney, DM, HTN, HL, tobacco use who presented to the ED at [facility] after several days of decreased UOP and generalized weakness with increasing shortness of breath.  He had a fall without LOC on the day of admission and EMS was called.   On chart review it appears his PCP has been concerned about rising creatinine over the last several months and had him stop taking Lasix.   He denies significant chest pain or recent fevers, no abdominal pain, n/v/d.    He was initially hypotensive in the ED with negative CT abd/pelvis, C-spine and head. Labs showed Creatinine of 5.17, K 6.1, bicarb 7 and AG 16, pH 7.1, pCO2 25, trop 27, WBC 11k, leuks and bacteria on UA, lactic acid 1.6.  He was initially given 3L IVF and broad spectrum antibiotics then Lasix along with insulin and calcium.  He was transferred to [facility] ED where he arrived on Vaso and Levophed 20mg, on RA with tachypnea and still mentating.  PCCM consulted for admission  Pertinent  Medical History  CKD IIIb  Solitary Kidney DM Type II  HTN HL Significant Hospital Events: Including procedures, antibiotic start and stop dates in addition to other pertinent events   10/24 [facility] and renal failure on levo and vaso.  Started on cefepime, flagyl, vancomycin. 10/25 - Remains on levo at 163m and Vaso at 0.3. Attempting to decrease pressor requirements.  10/26 - Decreasing pressor requirements. Vaso is off and Levo at 63m57m Echo shows severely enlarged RA and RV with RVSP of  8m66mand LVEF of 60-65%.  VQ scan showing left-sided wedge-shaped perfusion deficit which was noted is moderate to large in the superior segment of the left lower  lobe in the apicoposterior segment of the left upper lobe and lingula consistent with high probability of pulmonary emboli anticoagulation resumed in the setting of argatroban.  Attempted inotropic support, low-dose dobutamine triggered atrial fibrillation with RVR and therefore aborted.  Advanced heart failure team consulted for additional recommendations 10/27 back in atrial fibrillation amiodarone initiated platelet count still low stopping aspirin continuing amiodarone hemoglobin drifting down 1 g watching closely.  Interim History / Subjective:  No events. Tired, does not want any further aggressive care.  Objective   Blood pressure 122/76, pulse (!) 104, temperature (!) 96.4 F (35.8 C), temperature source Axillary, resp. rate (!) 26, height '5\' 5"'$  (1.651 m), weight 108.2 kg, SpO2 95 %. CVP:  [12 mmHg-21 mmHg] 15 mmHg      Intake/Output Summary (Last 24 hours) at 10/2November 21, 20231 Last data filed at 10/221-Nov-20230 Gross per 24 hour  Intake 1122.23 ml  Output 3964 ml  Net -2841.77 ml    Filed Weights   11/08/21 0500 11/09/21 0500 11/10/21 0500  Weight: 110 kg 110.1 kg 108.2 kg    Examination: Chronically ill man in NAD Scattered bruising Mildly tachypneic, occasional accessory muscle use  Resolved Hospital Problem list   Elevated troponin Cardiogenic shock Assessment & Plan:   Acute on chronic cor pulmonale multifactorial with bedbound status, new PE, longstanding OHS/OSA, now cardiorenal syndrome- would like to transition to a more comfort based approach to care. -  DC CRRT - Morphine titrated to RR 16 - DC AC, amio - Ativan and haldol PRN - Appreciate PMT involvement - In hospital death expected  34 min cc time Harry Emery MD PCCM

## 2021-11-15 DEATH — deceased

## 2021-12-15 NOTE — Death Summary Note (Signed)
DEATH SUMMARY   Patient Details  Name: Harry James MRN: [id number] DOB: [birth year]  Admission/Discharge Information   Admit Date:  2021-11-17  Date of Death: Date of Death: 2021-11-21  Time of Death: Time of Death: 1733-04-06  Length of Stay: 4  Referring Physician: Susy Frizzle, MD    Diagnoses  Preliminary cause of death: acute on chronic cor pulmonale  Secondary Diagnoses (including complications and co-morbidities):  Cardiorenal syndrome Groups 2/3/4 pulmonary HTN Failure to thrive OHS OSA  Brief Hospital Course (including significant findings, care, treatment, and services provided and events leading to death)  Harry James is a 71 y.o. M with PMH of CKD stage 3b, solitary kidney, DM, HTN, HL, tobacco use who presented to the ED at [facility] after several days of decreased UOP and generalized weakness with increasing shortness of breath.  He had a fall without LOC on the day of admission and EMS was called.   On chart review it appears his PCP has been concerned about rising creatinine over the last several months and had him stop taking Lasix.    He denies significant chest pain or recent fevers, no abdominal pain, n/v/d.    He was initially hypotensive in the ED with negative CT abd/pelvis, C-spine and head. Labs showed Creatinine of 5.17, K 6.1, bicarb 7 and AG 16, pH 7.1, pCO2 25, trop 27, WBC 11k, leuks and bacteria on UA, lactic acid 1.6.  He was initially given 3L IVF and broad spectrum antibiotics then Lasix along with insulin and calcium.  He was transferred to [facility] ED where he arrived on Vaso and Levophed 98mg, on RA with tachypnea and still mentating.   Workup revealed essentially end stage multifactorial cor pulmonale.  Started on CRRT.  Pressor needs improved.  Discussions between patient, CHF service, and palliative care were held and his values elicited.  Given his quality of life, stated wishes, we transitioned goals of care to comfort and he passed away  peacefully.   Pertinent Labs and Studies  Significant Diagnostic Studies DG Chest Port 1 View  Result Date: 11/10/2021 CLINICAL DATA:  Shortness of breath. EXAM: PORTABLE CHEST 1 VIEW COMPARISON:  November 09, 2021. FINDINGS: Stable cardiomegaly. Right internal jugular catheter is unchanged. Both lungs are clear. The visualized skeletal structures are unremarkable. IMPRESSION: No active disease. Electronically Signed   By: JMarijo ConceptionM.D.   On: 11/10/2021 08:37   DG CHEST PORT 1 VIEW  Result Date: 11/09/2021 CLINICAL DATA:  91209 Atelectasis 91209 EXAM: PORTABLE CHEST 1 VIEW COMPARISON:  Chest x-ray 111/03/2021 CT chest 12023/11/03FINDINGS: Right subclavian venous central catheter with tip overlying the expected region of the distal superior vena cava. Enlarged cardiac silhouette. The heart and mediastinal contours are grossly unchanged with slightly limited evaluation due to patient rotation. Aortic calcification. Aneurysmal descending thoracic aorta better evaluated on CT chest 111/03/23 Left base atelectasis. No focal consolidation. No pulmonary edema. No pleural effusion. No pneumothorax. No acute osseous abnormality. IMPRESSION: 1. Right lower lobe atelectasis. 2. Cardiomegaly. 3. Aortic Atherosclerosis (ICD10-I70.0). 4. Aneurysmal descending thoracic aorta better evaluated on CT chest 103-Nov-2023 Aortic aneurysm NOS (ICD10-I71.9). Electronically Signed   By: MIven FinnM.D.   On: 11/09/2021 18:13   NM Pulmonary Perfusion  Result Date: 11/09/2021 CLINICAL DATA:  Shortness of breath EXAM: NUCLEAR MEDICINE PERFUSION LUNG SCAN TECHNIQUE: Perfusion images were obtained in multiple projections after intravenous injection of radiopharmaceutical. RADIOPHARMACEUTICALS:  3.9 mCi Tc-959mAA IV COMPARISON:  Chest x-ray 1011-03-23INDINGS: Multiple predominantly  left-sided wedge-shaped perfusion defects. Moderate to large perfusion defect in the region of superior segment of left lower lobe,  apicoposterior segment of left upper lobe and smaller wedge-shaped defect in the region of the lingular segment of left upper lobe. No significant right-sided perfusion defects. IMPRESSION: Pulmonary embolism present based on PISAPED criteria These results will be called to the ordering clinician or representative by the Radiologist Assistant, and communication documented in the PACS or [employer]. Electronically Signed   By: Donavan Foil M.D.   On: 11/09/2021 17:21   VAS [place] LOWER EXTREMITY VENOUS (DVT)  Result Date: 11/08/2021  Lower Venous DVT Study Patient Name:  Harry James  Date of Exam:   11/08/2021 Medical Rec #: [id number]        Accession #:    [id number] Date of Birth: 1950-05-14        Patient Gender: M Patient Age:   71 years Exam Location:  [facility] Procedure:      VAS [place] LOWER EXTREMITY VENOUS (DVT) Referring Phys: Kipp Brood --------------------------------------------------------------------------------  Indications: Edema.  Limitations: Body habitus, poor ultrasound/tissue interface and rt groin bandaging. Comparison Study: no prior Performing Technologist: Archie Patten RVS  Examination Guidelines: A complete evaluation includes B-mode imaging, spectral Doppler, color Doppler, and power Doppler as needed of all accessible portions of each vessel. Bilateral testing is considered an integral part of a complete examination. Limited examinations for reoccurring indications may be performed as noted. The reflux portion of the exam is performed with the patient in reverse Trendelenburg.  +---------+---------------+---------+-----------+----------+-------------------+ RIGHT    CompressibilityPhasicitySpontaneityPropertiesThrombus Aging      +---------+---------------+---------+-----------+----------+-------------------+ CFV                                                   Not well visualized  +---------+---------------+---------+-----------+----------+-------------------+ SFJ                                                   Not well visualized +---------+---------------+---------+-----------+----------+-------------------+ FV Prox                                               Not well visualized +---------+---------------+---------+-----------+----------+-------------------+ FV Mid                  Yes      Yes                                      +---------+---------------+---------+-----------+----------+-------------------+ FV Distal               Yes      Yes                                      +---------+---------------+---------+-----------+----------+-------------------+ PFV  Not well visualized +---------+---------------+---------+-----------+----------+-------------------+ POP      Full                                                             +---------+---------------+---------+-----------+----------+-------------------+ PTV      Full                                                             +---------+---------------+---------+-----------+----------+-------------------+ PERO     Full           Yes      Yes                                      +---------+---------------+---------+-----------+----------+-------------------+   +---------+---------------+---------+-----------+----------+--------------+ LEFT     CompressibilityPhasicitySpontaneityPropertiesThrombus Aging +---------+---------------+---------+-----------+----------+--------------+ CFV      Full           Yes      Yes                                 +---------+---------------+---------+-----------+----------+--------------+ SFJ      Full                                                        +---------+---------------+---------+-----------+----------+--------------+ FV Prox  Full                                                         +---------+---------------+---------+-----------+----------+--------------+ FV Mid                  Yes      Yes                                 +---------+---------------+---------+-----------+----------+--------------+ FV Distal               Yes      Yes                                 +---------+---------------+---------+-----------+----------+--------------+ PFV      Full                                                        +---------+---------------+---------+-----------+----------+--------------+ POP      Full           Yes      Yes                                 +---------+---------------+---------+-----------+----------+--------------+  PTV      Full                                                        +---------+---------------+---------+-----------+----------+--------------+ PERO     Full           Yes      Yes                                 +---------+---------------+---------+-----------+----------+--------------+     Summary: RIGHT: - There is no evidence of deep vein thrombosis in the lower extremity. However, portions of this examination were limited- see technologist comments above.  - No cystic structure found in the popliteal fossa.  LEFT: - There is no evidence of deep vein thrombosis in the lower extremity. However, portions of this examination were limited- see technologist comments above.  - No cystic structure found in the popliteal fossa.  *See table(s) above for measurements and observations. Electronically signed by Harold Barban MD on 11/08/2021 at 10:37:04 PM.    Final    ECHOCARDIOGRAM COMPLETE  Result Date: 11/08/2021    ECHOCARDIOGRAM REPORT   Patient Name:   DAYVON DAX Date of Exam: 11/08/2021 Medical Rec #:  [id number]       Height:       65.0 in Accession #:    [id number]      Weight:       242.5 lb Date of Birth:  Dec 22, 1950       BSA:          2.147 m Patient Age:    71 years        BP:            135/63 mmHg Patient Gender: M               HR:           66 bpm. Exam Location:  Inpatient Procedure: 2D Echo, Intracardiac Opacification Agent, Cardiac Doppler and Color            Doppler Indications:    shock  History:        Patient has no prior history of Echocardiogram examinations.                 Chronic kidney disease and COPD; Risk Factors:Hypertension.  Sonographer:    Johny Chess RDCS Referring Phys: [id number] [place]  1. Left ventricular ejection fraction, by estimation, is 60 to 65%. The left ventricle has normal function. The left ventricle has no regional wall motion abnormalities. Left ventricular diastolic function could not be evaluated. There is the interventricular septum is flattened in systole, consistent with right ventricular pressure overload.  2. Right ventricular systolic function is severely reduced. The right ventricular size is severely enlarged. There is moderately elevated pulmonary artery systolic pressure. The estimated right ventricular systolic pressure is 01.6 mmHg.  3. Left atrial size was moderately dilated.  4. Right atrial size was severely dilated.  5. The mitral valve is normal in structure. No evidence of mitral valve regurgitation.  6. The aortic valve is tricuspid. Aortic valve regurgitation is not visualized. No aortic stenosis is present.  7. The inferior vena cava is dilated in size with <50% respiratory variability, suggesting right  atrial pressure of 15 mmHg. Conclusion(s)/Recommendation(s): Findings consistent with Cor Pulmonale. Overall impression is of chronic cor pulmonale, but cannot exclude a superimposed acute event such as pulmonary embolism. FINDINGS  Left Ventricle: Left ventricular ejection fraction, by estimation, is 60 to 65%. The left ventricle has normal function. The left ventricle has no regional wall motion abnormalities. Definity contrast agent was given IV to delineate the left ventricular  endocardial borders.  The left ventricular internal cavity size was normal in size. There is no left ventricular hypertrophy. The interventricular septum is flattened in systole, consistent with right ventricular pressure overload. Left ventricular diastolic function could not be evaluated due to atrial fibrillation. Left ventricular diastolic function could not be evaluated. Right Ventricle: The right ventricular size is severely enlarged. No increase in right ventricular wall thickness. Right ventricular systolic function is severely reduced. There is moderately elevated pulmonary artery systolic pressure. The tricuspid regurgitant velocity is 3.28 m/s, and with an assumed right atrial pressure of 15 mmHg, the estimated right ventricular systolic pressure is 85.6 mmHg. Left Atrium: Left atrial size was moderately dilated. Right Atrium: Right atrial size was severely dilated. Pericardium: There is no evidence of pericardial effusion. Mitral Valve: The mitral valve is normal in structure. No evidence of mitral valve regurgitation. Tricuspid Valve: The tricuspid valve is normal in structure. Tricuspid valve regurgitation is mild. Aortic Valve: The aortic valve is tricuspid. Aortic valve regurgitation is not visualized. No aortic stenosis is present. Pulmonic Valve: The pulmonic valve was normal in structure. Pulmonic valve regurgitation is not visualized. Aorta: The aortic root and ascending aorta are structurally normal, with no evidence of dilitation. Venous: The inferior vena cava is dilated in size with less than 50% respiratory variability, suggesting right atrial pressure of 15 mmHg. IAS/Shunts: No atrial level shunt detected by color flow Doppler.  LEFT VENTRICLE PLAX 2D LVIDd:         4.30 cm   Diastology LVIDs:         3.50 cm   LV e' medial:    5.55 cm/s LV PW:         1.18 cm   LV E/e' medial:  21.1 LV IVS:        1.10 cm   LV e' lateral:   8.38 cm/s LVOT diam:     2.00 cm   LV E/e' lateral: 14.0 LV SV:         46 LV SV Index:    21 LVOT Area:     3.14 cm  RIGHT VENTRICLE            IVC RV S prime:     8.70 cm/s  IVC diam: 2.80 cm TAPSE (M-mode): 1.3 cm LEFT ATRIUM             Index        RIGHT ATRIUM           Index LA diam:        4.70 cm 2.19 cm/m   RA Area:     21.50 cm LA Vol (A2C):   54.1 ml 25.20 ml/m  RA Volume:   62.10 ml  28.92 ml/m LA Vol (A4C):   61.9 ml 28.83 ml/m LA Biplane Vol: 60.3 ml 28.09 ml/m  AORTIC VALVE LVOT Vmax:   83.30 cm/s LVOT Vmean:  49.800 cm/s LVOT VTI:    0.146 m  AORTA Ao Root diam: 3.20 cm Ao Asc diam:  3.20 cm MITRAL VALVE  TRICUSPID VALVE MV Area (PHT): 5.34 cm     TR Peak grad:   43.0 mmHg MV Decel Time: 142 msec     TR Vmax:        328.00 cm/s MV E velocity: 117.00 cm/s MV A velocity: 61.00 cm/s   SHUNTS MV E/A ratio:  1.92         Systemic VTI:  0.15 m                             Systemic Diam: 2.00 cm Dani Gobble Croitoru MD Electronically signed by Sanda Klein MD Signature Date/Time: 11/08/2021/12:17:07 PM    Final    DG CHEST PORT 1 VIEW  Result Date: 10/30/2021 CLINICAL DATA:  Shortness of breath. EXAM: PORTABLE CHEST 1 VIEW COMPARISON:  November 07, 2021 (3:47 p.m.) FINDINGS: There is stable right-sided venous catheter positioning. The cardiac silhouette is mildly enlarged and unchanged in size. There is moderate to marked severity calcification and tortuosity of the descending thoracic aorta with a stable area of distal aneurysmal dilatation. Mild, stable bibasilar atelectasis is seen. There is no evidence of focal consolidation, pleural effusion or pneumothorax. The visualized skeletal structures are unremarkable. IMPRESSION: 1. Mild, stable bibasilar atelectasis. Electronically Signed   By: Virgina Norfolk M.D.   On: 10/27/2021 21:03   CT CHEST ABDOMEN PELVIS WO CONTRAST  Result Date: 11/12/2021 CLINICAL DATA:  hypotension SOB, poor renal function EXAM: CT CHEST, ABDOMEN AND PELVIS WITHOUT CONTRAST TECHNIQUE: Multidetector CT imaging of the chest, abdomen and  pelvis was performed following the standard protocol without IV contrast. RADIATION DOSE REDUCTION: This exam was performed according to the departmental dose-optimization program which includes automated exposure control, adjustment of the mA and/or kV according to patient size and/or use of iterative reconstruction technique. COMPARISON:  CT abdomen pelvis 12/15/2010, CT chest 12/08/2002 report without imaging. FINDINGS: CT CHEST FINDINGS Cardiovascular: Question enlarged right atrium. No significant pericardial effusion. Markedly limited evaluation of a descending thoracic aorta dissection and aneurysm: Measuring up to 4.6 cm. Severe atherosclerotic plaque of the thoracic aorta. Two vessel coronary artery calcifications. Mediastinum/Nodes: No gross hilar adenopathy, noting limited sensitivity for the detection of hilar adenopathy on this noncontrast study. No enlarged mediastinal or axillary lymph nodes. Thyroid gland, trachea, and esophagus demonstrate no significant findings. Lungs/Pleura: Centrilobular emphysematous changes. Limited evaluation due to respiratory motion artifact. No focal consolidation. No pulmonary nodule. No pulmonary mass. No pleural effusion. No pneumothorax. Musculoskeletal: No chest wall abnormality. No suspicious lytic or blastic osseous lesions. No acute displaced fracture with markedly limited evaluation of the sternum and ribs due to motion artifact. Multilevel degenerative changes of the spine. CT ABDOMEN PELVIS FINDINGS Hepatobiliary: No focal liver abnormality. No gallstones, gallbladder wall thickening, or pericholecystic fluid. No biliary dilatation. Pancreas: No focal lesion. Normal pancreatic contour. No surrounding inflammatory changes. No main pancreatic ductal dilatation. Spleen: Normal in size without focal abnormality. Adrenals/Urinary Tract: No adrenal nodule bilaterally. Nonspecific bilateral perinephric stranding. Bilateral renal cortical scarring. Couple punctate  calcifications associated with the kidneys may be vascular.No definite nephrolithiasis and no hydronephrosis. No definite contour-deforming renal mass. No ureterolithiasis or hydroureter. The urinary bladder is decompressed with Foley catheter tip and balloon within the lumen. Stomach/Bowel: Stomach is within normal limits. No evidence of bowel wall thickening or dilatation. Appendix appears normal. Vascular/Lymphatic: Interval increase in size of an infrarenal abdominal aorta aneurysm measuring 5 x 4.1 cm (from 3.9 x 3.2 cm). Aneurysmal bilateral common iliac arteries measuring  0.7 cm on the left and 2.6 cm on the right. Severe atherosclerotic plaque of the aorta and its branches. No abdominal, pelvic, or inguinal lymphadenopathy. Reproductive: Prostate is unremarkable. Other: No intraperitoneal free fluid. No intraperitoneal free gas. No organized fluid collection. Musculoskeletal: Small fat containing umbilical hernia. Diffuse mild subcutaneus soft tissue edema. No suspicious lytic or blastic osseous lesions. No acute displaced fracture. Multilevel degenerative changes of the spine. Severe degenerative changes of the left hip. IMPRESSION: 1. Markedly limited evaluation on this noncontrast study with respiratory motion artifact. 2. Markedly limited evaluation of a known chronic thoracic and abdominal aortic dissection. Aneurysmal descending thoracic aorta (4.6 cm) as well as interval increase in size of an aneurysmal infrarenal abdominal aorta (5 cm). Recommend referral to a vascular specialist. This recommendation follows ACR consensus guidelines: White Paper of the ACR Incidental Findings Committee II on Vascular Findings. J Am Coll Radiol 2013; 10:789-794. 3. Aneurysmal bilateral common iliac arteries measuring 0.7 cm on the left and 2.6 cm on the right. 4. Aortic Atherosclerosis (ICD10-I70.0) and Emphysema (ICD10-J43.9). 5. Enlarged right atrium. 6. No acute nonvascular intrathoracic, intra-abdominal,  intrapelvic abnormality. Electronically Signed   By: Iven Finn M.D.   On: 10/28/2021 17:53   CT Cervical Spine Wo Contrast  Result Date: 11/03/2021 CLINICAL DATA:  Weakness, confusion, trauma EXAM: CT CERVICAL SPINE WITHOUT CONTRAST TECHNIQUE: Multidetector CT imaging of the cervical spine was performed without intravenous contrast. Multiplanar CT image reconstructions were also generated. RADIATION DOSE REDUCTION: This exam was performed according to the departmental dose-optimization program which includes automated exposure control, adjustment of the mA and/or kV according to patient size and/or use of iterative reconstruction technique. COMPARISON:  None Available. FINDINGS: Evaluation of the cervical spine is severely limited by patient motion throughout the study. Despite repeat imaging, evaluation of the C4 through C7 levels is severely limited and nondiagnostic. C1 through C3 vertebral bodies are in normal alignment, with no evidence of acute fracture. Soft tissues of the skull base and within the upper cervical spine are unremarkable. Evaluation of the central canal is limited by motion. Visualized portions of the lung apices are clear. IMPRESSION: 1. Extremely limited evaluation due to patient motion throughout the exam. This is a nondiagnostic evaluation of the C4 through C7 vertebral bodies. 2. Unremarkable appearance of the C1 through C3 vertebral bodies. If cervical spine fracture remains a clinical concern, repeat CT cervical spine may be useful when the patient is able to fully cooperate with the study. Electronically Signed   By: Randa Ngo M.D.   On: 10/23/2021 17:37   CT Head Wo Contrast  Result Date: 10/24/2021 CLINICAL DATA:  Head trauma, loss of consciousness, weakness, confusion EXAM: CT HEAD WITHOUT CONTRAST TECHNIQUE: Contiguous axial images were obtained from the base of the skull through the vertex without intravenous contrast. RADIATION DOSE REDUCTION: This exam was  performed according to the departmental dose-optimization program which includes automated exposure control, adjustment of the mA and/or kV according to patient size and/or use of iterative reconstruction technique. COMPARISON:  None Available. FINDINGS: Evaluation is limited by patient motion throughout the exam. Brain: No acute infarct or hemorrhage. Lateral ventricles and midline structures are grossly unremarkable. No acute extra-axial fluid collections. No mass effect. Vascular: No hyperdense vessel or unexpected calcification. Skull: Normal. Negative for fracture or focal lesion. Sinuses/Orbits: No acute finding. Other: None. IMPRESSION: 1. Limited study due to patient motion throughout the exam. 2. No acute intracranial process. Electronically Signed   By: Diana Eves.D.  On: 11/14/2021 17:35   DG Chest 1V REPEAT Same Day  Result Date: 10/20/2021 CLINICAL DATA:  71 year old male with central line placement EXAM: CHEST - 1 VIEW SAME DAY COMPARISON:  10/17/2021 FINDINGS: Cardiomediastinal silhouette unchanged in size and contour. No evidence of central vascular congestion. No interlobular septal thickening. Interval placement of right IJ central venous catheter with the tip terminating superior vena cava. No pneumothorax or pleural effusion. Coarsened interstitial markings, with no confluent airspace disease. No acute displaced fracture. IMPRESSION: Interval placement of right IJ central venous catheter, with no complicating features Electronically Signed   By: Corrie Mckusick D.O.   On: 10/23/2021 15:57   DG Chest 1 View  Result Date: 11/08/2021 CLINICAL DATA:  Chest pain EXAM: CHEST  1 VIEW COMPARISON:  Chest 12/22/2010 FINDINGS: Cardiac enlargement. Negative for heart failure or edema. Atherosclerotic calcification thoracic aorta. Lungs clear without infiltrate or effusion. IMPRESSION: No active disease. Electronically Signed   By: Franchot Gallo M.D.   On: 11/09/2021 11:43     Microbiology Recent Results (from the past 240 hour(s))  Resp Panel by RT-PCR (Flu A&B, Covid) Anterior Nasal Swab     Status: None   Collection Time: 10/23/2021 10:32 AM   Specimen: Anterior Nasal Swab  Result Value Ref Range Status   SARS Coronavirus 2 by RT PCR NEGATIVE NEGATIVE Final    Comment: (NOTE) SARS-CoV-2 target nucleic acids are NOT DETECTED.  The SARS-CoV-2 RNA is generally detectable in upper respiratory specimens during the acute phase of infection. The lowest concentration of SARS-CoV-2 viral copies this assay can detect is 138 copies/mL. A negative result does not preclude SARS-Cov-2 infection and should not be used as the sole basis for treatment or other patient management decisions. A negative result may occur with  improper specimen collection/handling, submission of specimen other than nasopharyngeal swab, presence of viral mutation(s) within the areas targeted by this assay, and inadequate number of viral copies(<138 copies/mL). A negative result must be combined with clinical observations, patient history, and epidemiological information. The expected result is Negative.  Fact Sheet for Patients:  EntrepreneurPulse.com.au  Fact Sheet for Healthcare Providers:  IncredibleEmployment.be  This test is no t yet approved or cleared by the [place] FDA and  has been authorized for detection and/or diagnosis of SARS-CoV-2 by FDA under an Emergency Use Authorization (EUA). This EUA will remain  in effect (meaning this test can be used) for the duration of the COVID-19 declaration under Section 564(b)(1) of the Act, 21 U.S.C.section 360bbb-3(b)(1), unless the authorization is terminated  or revoked sooner.       Influenza A by PCR NEGATIVE NEGATIVE Final   Influenza B by PCR NEGATIVE NEGATIVE Final    Comment: (NOTE) The Xpert Xpress SARS-CoV-2/FLU/RSV plus assay is intended as an aid in the diagnosis of influenza  from Nasopharyngeal swab specimens and should not be used as a sole basis for treatment. Nasal washings and aspirates are unacceptable for Xpert Xpress SARS-CoV-2/FLU/RSV testing.  Fact Sheet for Patients: EntrepreneurPulse.com.au  Fact Sheet for Healthcare Providers: IncredibleEmployment.be  This test is not yet approved or cleared by the [place] FDA and has been authorized for detection and/or diagnosis of SARS-CoV-2 by FDA under an Emergency Use Authorization (EUA). This EUA will remain in effect (meaning this test can be used) for the duration of the COVID-19 declaration under Section 564(b)(1) of the Act, 21 U.S.C. section 360bbb-3(b)(1), unless the authorization is terminated or revoked.  Performed at [facility], [address], [place],  [place] [address]   Blood culture (routine x 2)     Status: None   Collection Time: 10/30/2021 11:01 AM   Specimen: BLOOD LEFT HAND  Result Value Ref Range Status   Specimen Description   Final    BLOOD LEFT HAND BOTTLES DRAWN AEROBIC AND ANAEROBIC Blood Culture adequate volume   Special Requests Immunocompromised  Final   Culture   Final    NO GROWTH 5 DAYS Performed at [facility], [address], [place], [place] [address]    Report Status 11/12/2021 FINAL  Final  Blood culture (routine x 2)     Status: None   Collection Time: 11/12/2021 11:01 AM   Specimen: BLOOD RIGHT HAND  Result Value Ref Range Status   Specimen Description   Final    BLOOD RIGHT HAND BOTTLES DRAWN AEROBIC AND ANAEROBIC Blood Culture results may not be optimal due to an inadequate volume of blood received in culture bottles   Special Requests Immunocompromised  Final   Culture   Final    NO GROWTH 5 DAYS Performed at [facility], [address], [place], [place] [address]    Report Status 11/12/2021 FINAL  Final  Urine Culture     Status: Abnormal   Collection Time: 11/08/2021  2:08 PM   Specimen: Urine, Catheterized   Result Value Ref Range Status   Specimen Description   Final    URINE, CATHETERIZED Performed at [facility], [address] [place], [place] [address]    Special Requests   Final    NONE Performed at [facility], [address], [place], [place] [address]    Culture 80,000 COLONIES/mL STAPHYLOCOCCUS EPIDERMIDIS (A)  Final   Report Status 11/10/2021 FINAL  Final   Organism ID, Bacteria STAPHYLOCOCCUS EPIDERMIDIS (A)  Final      Susceptibility   Staphylococcus epidermidis - MIC*    CIPROFLOXACIN <=0.5 SENSITIVE Sensitive     GENTAMICIN <=0.5 SENSITIVE Sensitive     NITROFURANTOIN <=16 SENSITIVE Sensitive     OXACILLIN >=4 RESISTANT Resistant     TETRACYCLINE 2 SENSITIVE Sensitive     VANCOMYCIN 2 SENSITIVE Sensitive     TRIMETH/SULFA <=10 SENSITIVE Sensitive     CLINDAMYCIN <=0.25 SENSITIVE Sensitive     RIFAMPIN <=0.5 SENSITIVE Sensitive     Inducible Clindamycin NEGATIVE Sensitive     * 80,000 COLONIES/mL STAPHYLOCOCCUS EPIDERMIDIS  MRSA Next Gen by PCR, Nasal     Status: None   Collection Time: 11/04/2021 10:43 PM   Specimen: Nasal Mucosa; Nasal Swab  Result Value Ref Range Status   MRSA by PCR Next Gen NOT DETECTED NOT DETECTED Final    Comment: (NOTE) The GeneXpert MRSA Assay (FDA approved for NASAL specimens only), is one component of a comprehensive MRSA colonization surveillance program. It is not intended to diagnose MRSA infection nor to guide or monitor treatment for MRSA infections. Test performance is not FDA approved in patients less than 33 years old. Performed at [facility], [place] [address], [place], [place] [address]     Lab Basic Metabolic Panel: Recent Labs  Lab 11/09/21 0436 11/09/21 1808 11/10/21 0430 11/10/21 1540 11-15-2021 0404  NA 138 140 137 138 137  K 4.1 4.0 4.0 4.0 3.9  CL 104 106 103 106 103  CO2 23 23 21* 25 25  GLUCOSE 126* 103* 128* 150* 130*  BUN '23 17 14 12 8  '$ CREATININE 1.75* 1.33* 1.20 1.10 0.99  CALCIUM 7.0* 6.4* 7.3*  7.4* 7.7*  MG 2.2  --  2.4  --  2.3  PHOS 2.5 2.1* 2.0* 1.5* 1.4*   Liver Function Tests: Recent Labs  Lab 11/09/21 0436 11/09/21 1808 11/10/21 0430 11/10/21 1540 Nov 12, 2021 0404  ALBUMIN 2.4* 2.2* 2.7* 2.9* 3.0*   No results for input(s): "LIPASE", "AMYLASE" in the last 168 hours. No results for input(s): "AMMONIA" in the last 168 hours. CBC: Recent Labs  Lab 11/09/21 0436 11/10/21 0430 11-12-21 0404  WBC 9.9 7.8 9.8  HGB 11.6* 10.6* 11.4*  HCT 34.3* 31.4* 34.3*  MCV 93.0 94.0 96.6  PLT 59* 56* 82*   Cardiac Enzymes: No results for input(s): "CKTOTAL", "CKMB", "CKMBINDEX", "TROPONINI" in the last 168 hours. Sepsis Labs: Recent Labs  Lab 11/09/21 0436 11/09/21 1855 11/10/21 0430 12-Nov-2021 0404  PROCALCITON 0.13  --   --   --   WBC 9.9  --  7.8 9.8  LATICACIDVEN  --  1.0  --   --       Candee Furbish 11/15/2021, 4:36 PM
